# Patient Record
Sex: Female | Born: 1935
Health system: Southern US, Community
[De-identification: ages and names within clinical notes are randomized; demographics above are authoritative.]

## PROBLEM LIST (undated history)

## (undated) DIAGNOSIS — E119 Type 2 diabetes mellitus without complications: Secondary | ICD-10-CM

## (undated) DIAGNOSIS — E669 Obesity, unspecified: Secondary | ICD-10-CM

## (undated) DIAGNOSIS — K279 Peptic ulcer, site unspecified, unspecified as acute or chronic, without hemorrhage or perforation: Secondary | ICD-10-CM

## (undated) DIAGNOSIS — I10 Essential (primary) hypertension: Secondary | ICD-10-CM

## (undated) DIAGNOSIS — E7800 Pure hypercholesterolemia, unspecified: Secondary | ICD-10-CM

## (undated) DIAGNOSIS — I161 Hypertensive emergency: Secondary | ICD-10-CM

## (undated) DIAGNOSIS — E1165 Type 2 diabetes mellitus with hyperglycemia: Secondary | ICD-10-CM

## (undated) DIAGNOSIS — R079 Chest pain, unspecified: Secondary | ICD-10-CM

## (undated) DIAGNOSIS — N1832 Chronic kidney disease, stage 3b: Secondary | ICD-10-CM

## (undated) DIAGNOSIS — E785 Hyperlipidemia, unspecified: Secondary | ICD-10-CM

## (undated) DIAGNOSIS — N183 Chronic kidney disease, stage 3 (moderate): Secondary | ICD-10-CM

## (undated) DIAGNOSIS — N179 Acute kidney failure, unspecified: Secondary | ICD-10-CM

## (undated) DIAGNOSIS — K219 Gastro-esophageal reflux disease without esophagitis: Secondary | ICD-10-CM

## (undated) DIAGNOSIS — R001 Bradycardia, unspecified: Secondary | ICD-10-CM

## (undated) DIAGNOSIS — F0390 Unspecified dementia without behavioral disturbance: Secondary | ICD-10-CM

## (undated) DIAGNOSIS — I5031 Acute diastolic (congestive) heart failure: Secondary | ICD-10-CM

## (undated) DIAGNOSIS — J9600 Acute respiratory failure, unspecified whether with hypoxia or hypercapnia: Secondary | ICD-10-CM

## (undated) HISTORY — DX: Type 2 diabetes mellitus without complications: E11.9

## (undated) HISTORY — PX: APPENDECTOMY: SHX54

## (undated) HISTORY — DX: Acute respiratory failure, unspecified whether with hypoxia or hypercapnia: J96.00

## (undated) HISTORY — DX: Hyperlipidemia, unspecified: E78.5

## (undated) HISTORY — DX: Chronic kidney disease, stage 3b: N18.32

## (undated) HISTORY — DX: Pure hypercholesterolemia, unspecified: E78.00

## (undated) HISTORY — DX: Type 2 diabetes mellitus with hyperglycemia: E11.65

## (undated) HISTORY — DX: Acute kidney failure, unspecified: N17.9

## (undated) HISTORY — DX: Essential (primary) hypertension: I10

## (undated) HISTORY — DX: Acute diastolic (congestive) heart failure: I50.31

## (undated) HISTORY — DX: Peptic ulcer, site unspecified, unspecified as acute or chronic, without hemorrhage or perforation: K27.9

## (undated) HISTORY — DX: Hypertensive emergency: I16.1

## (undated) HISTORY — DX: Obesity, unspecified: E66.9

## (undated) HISTORY — DX: Gastro-esophageal reflux disease without esophagitis: K21.9

## (undated) HISTORY — DX: Unspecified dementia, unspecified severity, without behavioral disturbance, psychotic disturbance, mood disturbance, and anxiety: F03.90

## (undated) HISTORY — DX: Bradycardia, unspecified: R00.1

## (undated) HISTORY — DX: Chronic kidney disease, stage 3 (moderate): N18.3

## (undated) HISTORY — DX: Chest pain, unspecified: R07.9

---

## 2012-01-03 DIAGNOSIS — E11319 Type 2 diabetes mellitus with unspecified diabetic retinopathy without macular edema: Secondary | ICD-10-CM | POA: Diagnosis not present

## 2012-01-03 DIAGNOSIS — H2510 Age-related nuclear cataract, unspecified eye: Secondary | ICD-10-CM | POA: Diagnosis not present

## 2012-01-03 DIAGNOSIS — H35039 Hypertensive retinopathy, unspecified eye: Secondary | ICD-10-CM | POA: Diagnosis not present

## 2012-01-03 DIAGNOSIS — H04129 Dry eye syndrome of unspecified lacrimal gland: Secondary | ICD-10-CM | POA: Diagnosis not present

## 2012-01-10 DIAGNOSIS — E119 Type 2 diabetes mellitus without complications: Secondary | ICD-10-CM | POA: Diagnosis not present

## 2012-01-10 DIAGNOSIS — I10 Essential (primary) hypertension: Secondary | ICD-10-CM | POA: Diagnosis not present

## 2012-01-10 DIAGNOSIS — E785 Hyperlipidemia, unspecified: Secondary | ICD-10-CM | POA: Diagnosis not present

## 2012-02-15 DIAGNOSIS — I10 Essential (primary) hypertension: Secondary | ICD-10-CM | POA: Diagnosis not present

## 2012-02-15 DIAGNOSIS — E119 Type 2 diabetes mellitus without complications: Secondary | ICD-10-CM | POA: Diagnosis not present

## 2012-02-15 DIAGNOSIS — E785 Hyperlipidemia, unspecified: Secondary | ICD-10-CM | POA: Diagnosis not present

## 2012-02-15 DIAGNOSIS — N189 Chronic kidney disease, unspecified: Secondary | ICD-10-CM | POA: Diagnosis not present

## 2012-02-28 DIAGNOSIS — I10 Essential (primary) hypertension: Secondary | ICD-10-CM | POA: Diagnosis not present

## 2012-02-28 DIAGNOSIS — I499 Cardiac arrhythmia, unspecified: Secondary | ICD-10-CM | POA: Diagnosis not present

## 2012-03-11 DIAGNOSIS — N3000 Acute cystitis without hematuria: Secondary | ICD-10-CM | POA: Diagnosis not present

## 2012-03-11 DIAGNOSIS — N390 Urinary tract infection, site not specified: Secondary | ICD-10-CM | POA: Diagnosis not present

## 2012-03-11 DIAGNOSIS — R1084 Generalized abdominal pain: Secondary | ICD-10-CM | POA: Diagnosis not present

## 2012-03-13 DIAGNOSIS — R1904 Left lower quadrant abdominal swelling, mass and lump: Secondary | ICD-10-CM | POA: Diagnosis not present

## 2012-03-13 DIAGNOSIS — R109 Unspecified abdominal pain: Secondary | ICD-10-CM | POA: Diagnosis not present

## 2012-03-28 DIAGNOSIS — I2510 Atherosclerotic heart disease of native coronary artery without angina pectoris: Secondary | ICD-10-CM | POA: Diagnosis not present

## 2012-03-28 DIAGNOSIS — I517 Cardiomegaly: Secondary | ICD-10-CM | POA: Diagnosis not present

## 2012-03-28 DIAGNOSIS — R109 Unspecified abdominal pain: Secondary | ICD-10-CM | POA: Diagnosis not present

## 2012-05-01 DIAGNOSIS — R1012 Left upper quadrant pain: Secondary | ICD-10-CM | POA: Diagnosis not present

## 2012-05-01 DIAGNOSIS — R1011 Right upper quadrant pain: Secondary | ICD-10-CM | POA: Diagnosis not present

## 2012-05-08 DIAGNOSIS — E119 Type 2 diabetes mellitus without complications: Secondary | ICD-10-CM | POA: Diagnosis not present

## 2012-05-08 DIAGNOSIS — E785 Hyperlipidemia, unspecified: Secondary | ICD-10-CM | POA: Diagnosis not present

## 2012-05-08 DIAGNOSIS — I10 Essential (primary) hypertension: Secondary | ICD-10-CM | POA: Diagnosis not present

## 2012-06-27 DIAGNOSIS — N2581 Secondary hyperparathyroidism of renal origin: Secondary | ICD-10-CM | POA: Diagnosis not present

## 2012-06-27 DIAGNOSIS — D649 Anemia, unspecified: Secondary | ICD-10-CM | POA: Diagnosis not present

## 2012-06-27 DIAGNOSIS — I10 Essential (primary) hypertension: Secondary | ICD-10-CM | POA: Diagnosis not present

## 2012-06-27 DIAGNOSIS — I129 Hypertensive chronic kidney disease with stage 1 through stage 4 chronic kidney disease, or unspecified chronic kidney disease: Secondary | ICD-10-CM | POA: Diagnosis not present

## 2012-08-14 DIAGNOSIS — Z23 Encounter for immunization: Secondary | ICD-10-CM | POA: Diagnosis not present

## 2012-08-14 DIAGNOSIS — E119 Type 2 diabetes mellitus without complications: Secondary | ICD-10-CM | POA: Diagnosis not present

## 2012-11-11 DIAGNOSIS — D649 Anemia, unspecified: Secondary | ICD-10-CM | POA: Diagnosis not present

## 2012-11-11 DIAGNOSIS — N2581 Secondary hyperparathyroidism of renal origin: Secondary | ICD-10-CM | POA: Diagnosis not present

## 2013-01-20 DIAGNOSIS — E785 Hyperlipidemia, unspecified: Secondary | ICD-10-CM | POA: Diagnosis not present

## 2013-01-20 DIAGNOSIS — E119 Type 2 diabetes mellitus without complications: Secondary | ICD-10-CM | POA: Diagnosis not present

## 2013-02-27 DIAGNOSIS — N189 Chronic kidney disease, unspecified: Secondary | ICD-10-CM | POA: Diagnosis not present

## 2013-02-27 DIAGNOSIS — I129 Hypertensive chronic kidney disease with stage 1 through stage 4 chronic kidney disease, or unspecified chronic kidney disease: Secondary | ICD-10-CM | POA: Diagnosis not present

## 2013-02-27 DIAGNOSIS — I10 Essential (primary) hypertension: Secondary | ICD-10-CM | POA: Diagnosis not present

## 2013-02-27 DIAGNOSIS — E785 Hyperlipidemia, unspecified: Secondary | ICD-10-CM | POA: Diagnosis not present

## 2013-02-27 DIAGNOSIS — E119 Type 2 diabetes mellitus without complications: Secondary | ICD-10-CM | POA: Diagnosis not present

## 2013-03-13 DIAGNOSIS — I10 Essential (primary) hypertension: Secondary | ICD-10-CM | POA: Diagnosis not present

## 2013-04-28 DIAGNOSIS — I10 Essential (primary) hypertension: Secondary | ICD-10-CM | POA: Diagnosis not present

## 2013-04-28 DIAGNOSIS — E785 Hyperlipidemia, unspecified: Secondary | ICD-10-CM | POA: Diagnosis not present

## 2013-04-28 DIAGNOSIS — E119 Type 2 diabetes mellitus without complications: Secondary | ICD-10-CM | POA: Diagnosis not present

## 2013-05-12 DIAGNOSIS — M722 Plantar fascial fibromatosis: Secondary | ICD-10-CM | POA: Diagnosis not present

## 2013-06-10 DIAGNOSIS — M7150 Other bursitis, not elsewhere classified, unspecified site: Secondary | ICD-10-CM | POA: Diagnosis not present

## 2013-06-10 DIAGNOSIS — M722 Plantar fascial fibromatosis: Secondary | ICD-10-CM | POA: Diagnosis not present

## 2013-06-10 DIAGNOSIS — E119 Type 2 diabetes mellitus without complications: Secondary | ICD-10-CM | POA: Diagnosis not present

## 2013-07-01 DIAGNOSIS — M722 Plantar fascial fibromatosis: Secondary | ICD-10-CM | POA: Diagnosis not present

## 2013-07-01 DIAGNOSIS — M7150 Other bursitis, not elsewhere classified, unspecified site: Secondary | ICD-10-CM | POA: Diagnosis not present

## 2013-07-03 DIAGNOSIS — H524 Presbyopia: Secondary | ICD-10-CM | POA: Diagnosis not present

## 2013-07-03 DIAGNOSIS — E119 Type 2 diabetes mellitus without complications: Secondary | ICD-10-CM | POA: Diagnosis not present

## 2013-07-03 DIAGNOSIS — H2510 Age-related nuclear cataract, unspecified eye: Secondary | ICD-10-CM | POA: Diagnosis not present

## 2013-07-10 ENCOUNTER — Other Ambulatory Visit: Payer: Self-pay | Admitting: *Deleted

## 2013-07-10 MED ORDER — HYDROCHLOROTHIAZIDE 12.5 MG PO TABS: 12.5000 mg | ORAL_TABLET | Freq: Every day | ORAL | Status: DC

## 2013-07-31 ENCOUNTER — Other Ambulatory Visit: Payer: Self-pay | Admitting: *Deleted

## 2013-07-31 DIAGNOSIS — E119 Type 2 diabetes mellitus without complications: Secondary | ICD-10-CM | POA: Insufficient documentation

## 2013-07-31 HISTORY — DX: Type 2 diabetes mellitus without complications: E11.9

## 2013-08-04 ENCOUNTER — Ambulatory Visit (INDEPENDENT_AMBULATORY_CARE_PROVIDER_SITE_OTHER): Payer: Medicare Other | Admitting: Endocrinology

## 2013-08-04 ENCOUNTER — Other Ambulatory Visit (INDEPENDENT_AMBULATORY_CARE_PROVIDER_SITE_OTHER): Payer: Medicare Other

## 2013-08-04 ENCOUNTER — Encounter: Payer: Self-pay | Admitting: Endocrinology

## 2013-08-04 VITALS — BP 126/72 | HR 60 | Temp 98.2°F | Resp 12 | Ht 65.0 in | Wt 180.7 lb

## 2013-08-04 DIAGNOSIS — E119 Type 2 diabetes mellitus without complications: Secondary | ICD-10-CM | POA: Diagnosis not present

## 2013-08-04 DIAGNOSIS — E785 Hyperlipidemia, unspecified: Secondary | ICD-10-CM | POA: Diagnosis not present

## 2013-08-04 DIAGNOSIS — I10 Essential (primary) hypertension: Secondary | ICD-10-CM

## 2013-08-04 DIAGNOSIS — I129 Hypertensive chronic kidney disease with stage 1 through stage 4 chronic kidney disease, or unspecified chronic kidney disease: Secondary | ICD-10-CM | POA: Insufficient documentation

## 2013-08-04 DIAGNOSIS — R252 Cramp and spasm: Secondary | ICD-10-CM

## 2013-08-04 DIAGNOSIS — N183 Chronic kidney disease, stage 3 unspecified: Secondary | ICD-10-CM

## 2013-08-04 HISTORY — DX: Hypertensive chronic kidney disease with stage 1 through stage 4 chronic kidney disease, or unspecified chronic kidney disease: I12.9

## 2013-08-04 HISTORY — DX: Essential (primary) hypertension: I10

## 2013-08-04 LAB — MICROALBUMIN / CREATININE URINE RATIO
Creatinine,U: 225.7 mg/dL
Microalb Creat Ratio: 1.5 mg/g (ref 0.0–30.0)

## 2013-08-04 LAB — URINALYSIS, ROUTINE W REFLEX MICROSCOPIC
Bilirubin Urine: NEGATIVE
Hgb urine dipstick: NEGATIVE
Ketones, ur: NEGATIVE
Total Protein, Urine: NEGATIVE
Urine Glucose: NEGATIVE
pH: 5.5 (ref 5.0–8.0)

## 2013-08-04 LAB — COMPREHENSIVE METABOLIC PANEL
ALT: 15 U/L (ref 0–35)
BUN: 25 mg/dL — ABNORMAL HIGH (ref 6–23)
CO2: 31 mEq/L (ref 19–32)
Calcium: 9.6 mg/dL (ref 8.4–10.5)
Chloride: 105 mEq/L (ref 96–112)
Creatinine, Ser: 1.5 mg/dL — ABNORMAL HIGH (ref 0.4–1.2)
GFR: 35.19 mL/min — ABNORMAL LOW (ref >60.00)
Total Bilirubin: 0.8 mg/dL (ref 0.3–1.2)

## 2013-08-04 LAB — HEMOGLOBIN A1C: Hgb A1c MFr Bld: 7.3 % — ABNORMAL HIGH (ref 4.6–6.5)

## 2013-08-04 NOTE — Progress Notes (Signed)
Patient ID: Vanessa Craig, female   DOB: Jan 13, 1928, 79 y.o.   MRN: [id number]  Vanessa Craig is an 79 y.o. female.   Reason for Appointment: Diabetes follow-up   History of Present Illness   Diagnosis: Type 2 DIABETES MELITUS, date of diagnosis:  1995        She has had mild and usually well-controlled diabetes for several years Previously on metformin and this was stopped because of renal dysfunction Also has been tried on low dose insulin previously However with using GLP-1 drugs like Viagra and Victoza her blood sugars have been fairly well controlled  More recently has been on Victoza since 2011 and generally has good controlled with low doses and minimal nausea Oral hypoglycemic drugs: Amaryl, Actos        Side effects from medications: None  Proper timing of medications in relation to meals: Yes.         Monitors blood glucose: Once a day.    Glucometer:  Accu-Chek         Blood Glucose readings from meter download: readings before breakfast: 3-121, nonfasting 73-194; mostly higher readings after her 2 PM, no readings after 5 PM Hypoglycemia frequency: none.          Meals: 3 meals per day.          Physical activity: exercise: Walking usually 5 days a week, has resumed recently, previously had issues with plantar fasciitis   Dietician visit: Most recent: Years ago        Complications: are: Minimal   The last HbgA1c report is not available  Wt Readings from Last 3 Encounters:  08/04/13 180 lb 11.2 oz (81.965 kg)   PROBLEM #2: She is asking about leg cramps especially at night. She has tried over-the-counter remedies as well as mustard and tonic water without much consistent relief. No history of hypokalemia  Office Visit on 08/04/2013  Component Date Value Range Status  . Magnesium 08/04/2013 1.6  1.5 - 2.5 mg/dL Final  Appointment on 08/04/2013  Component Date Value Range Status  . Hemoglobin A1C 08/04/2013 7.3* 4.6 - 6.5 % Final   Glycemic Control Guidelines for  People with Diabetes:Non Diabetic:  <6%Goal of Therapy: <7%Additional Action Suggested:  >8%   . Microalb, Ur 08/04/2013 3.3* 0.0 - 1.9 mg/dL Final  . Creatinine,U 08/04/2013 225.7   Final  . Microalb Creat Ratio 08/04/2013 1.5  0.0 - 30.0 mg/g Final  . Sodium 08/04/2013 141  135 - 145 mEq/L Final  . Potassium 08/04/2013 4.3  3.5 - 5.1 mEq/L Final  . Chloride 08/04/2013 105  96 - 112 mEq/L Final  . CO2 08/04/2013 31  19 - 32 mEq/L Final  . Glucose, Bld 08/04/2013 160* 70 - 99 mg/dL Final  . BUN 08/04/2013 25* 6 - 23 mg/dL Final  . Creatinine, Ser 08/04/2013 1.5* 0.4 - 1.2 mg/dL Final  . Total Bilirubin 08/04/2013 0.8  0.3 - 1.2 mg/dL Final  . Alkaline Phosphatase 08/04/2013 35* 39 - 117 U/L Final  . AST 08/04/2013 22  0 - 37 U/L Final  . ALT 08/04/2013 15  0 - 35 U/L Final  . Total Protein 08/04/2013 6.4  6.0 - 8.3 g/dL Final  . Albumin 08/04/2013 3.6  3.5 - 5.2 g/dL Final  . Calcium 08/04/2013 9.6  8.4 - 10.5 mg/dL Final  . GFR 08/04/2013 35.19* >60.00 mL/min Final  . Color, Urine 08/04/2013 LT. YELLOW  Yellow;Lt. Yellow Final  . APPearance 08/04/2013 CLEAR  Clear Final  .  Specific Gravity, Urine 08/04/2013 1.025  1.000-1.030 Final  . pH 08/04/2013 5.5  5.0 - 8.0 Final  . Total Protein, Urine 08/04/2013 NEGATIVE  Negative Final  . Urine Glucose 08/04/2013 NEGATIVE  Negative Final  . Ketones, ur 08/04/2013 NEGATIVE  Negative Final  . Bilirubin Urine 08/04/2013 NEGATIVE  Negative Final  . Hgb urine dipstick 08/04/2013 NEGATIVE  Negative Final  . Urobilinogen, UA 08/04/2013 0.2  0.0 - 1.0 Final  . Leukocytes, UA 08/04/2013 SMALL  Negative Final  . Nitrite 08/04/2013 NEGATIVE  Negative Final  . WBC, UA 08/04/2013 7-10/hpf  0-2/hpf Final  . RBC / HPF 08/04/2013 none seen  0-2/hpf Final  . Squamous Epithelial / LPF 08/04/2013 Few(5-10/hpf)  Rare(0-4/hpf) Final  . Bacteria, UA 08/04/2013 Rare(<10/hpf)  None Final      Medication List       This list is accurate as of: 08/04/13 11:59  PM.  Always use your most recent med list.               ACCU-CHEK AVIVA PLUS test strip  Generic drug:  glucose blood     B-D ULTRAFINE III SHORT PEN 31G X 8 MM Misc  Generic drug:  Insulin Pen Needle     carvedilol 6.25 MG tablet  Commonly known as:  COREG  6.25 mg.     fenofibrate 160 MG tablet  160 mg.     glimepiride 4 MG tablet  Commonly known as:  AMARYL  2 mg. 1 1/2 at lunch and 1/2 at supper     guanFACINE 2 MG tablet  Commonly known as:  TENEX  2 mg.     hydrochlorothiazide 12.5 MG tablet  Commonly known as:  HYDRODIURIL  Take 1 tablet (12.5 mg total) by mouth daily.     LEG CRAMP RELIEF PO  Take by mouth.     NEXIUM 40 MG capsule  Generic drug:  esomeprazole  40 mg.     pioglitazone 15 MG tablet  Commonly known as:  ACTOS  15 mg.     pravastatin 80 MG tablet  Commonly known as:  PRAVACHOL  80 mg.     ramipril 10 MG capsule  Commonly known as:  ALTACE  10 mg.     ranitidine 300 MG tablet  Commonly known as:  ZANTAC  300 mg.        Allergies:  Allergies  Allergen Reactions  . Codeine Nausea And Vomiting    Past Medical History  Diagnosis Date  . Diabetes mellitus without complication     Past Surgical History  Procedure Laterality Date  . Appendectomy      Family History  Problem Relation Age of Onset  . Heart disease Father     Social History:  reports that she has never smoked. She has never used smokeless tobacco. Her alcohol and drug histories are not on file.  Review of Systems:  HYPERTENSION:  she has had long-standing hypertension treated with multiple drugs, no recent lightheadedness  Renal dysfunction: She has had abnormal renal function but relatively stable for several years, has seen nephrologist  HYPERLIPIDEMIA: The lipid abnormality consists of elevated LDL and high triglycerides, currently on fenofibrate and pravastatin.     Examination:   BP 126/72  Pulse 60  Temp(Src) 98.2 F (36.8 C)  Resp 12  Ht  5\' 5"  (1.651 m)  Wt 180 lb 11.2 oz (81.965 kg)  BMI 30.07 kg/m2  SpO2 97%  Body mass index is 30.07 kg/(m^2).  The feet are normal to inspection and no edema  ASSESSMENT/ PLAN::   Diabetes type 2   The patient's diabetes control appears to be excellent with mostly good readings and only mild increase after lunch periodically Considering her age and comorbid conditions she has good control and advised her that she does not need to be strict with her diet all the time She does exercise regularly and she will continue to do so Encouraged her to check more readings after supper She will call if she has any hypoglycemia and may need to reduce Amaryl  CKD: Stable with creatinine 1.5  Hypertension: Good controlled without orthostatic symptoms  Leg cramps: Discussed continuing OTC/home remedies as well as trying to do stretching of the calf and foot muscles for at least 5 minutes before bedtime. Will check magnesium level and supplement if needed  [facility] 08/06/2013, 3:24 PM   Addendum: Magnesium 1.6, to use 250 mg OTC twice a day

## 2013-08-04 NOTE — Patient Instructions (Addendum)
Muscle stretches at bedtime  Some sugars at 8 pm

## 2013-08-05 ENCOUNTER — Telehealth: Payer: Self-pay | Admitting: *Deleted

## 2013-08-05 ENCOUNTER — Other Ambulatory Visit: Payer: Self-pay | Admitting: *Deleted

## 2013-08-05 DIAGNOSIS — M722 Plantar fascial fibromatosis: Secondary | ICD-10-CM | POA: Diagnosis not present

## 2013-08-05 DIAGNOSIS — E119 Type 2 diabetes mellitus without complications: Secondary | ICD-10-CM | POA: Diagnosis not present

## 2013-08-05 DIAGNOSIS — M7150 Other bursitis, not elsewhere classified, unspecified site: Secondary | ICD-10-CM | POA: Diagnosis not present

## 2013-08-05 MED ORDER — RALOXIFENE HCL 60 MG PO TABS: 60.0000 mg | ORAL_TABLET | Freq: Every day | ORAL | Status: DC

## 2013-08-05 NOTE — Telephone Encounter (Signed)
Noted pt is aware 

## 2013-08-05 NOTE — Telephone Encounter (Signed)
Message copied by Roxanna Mew on Tue Aug 05, 2013  4:48 PM ------      Message from: Elayne Snare      Created: Tue Aug 05, 2013 11:36 AM       Magnesium level is low normal, start OTC magnesium 250 mg twice a day for 2 weeks and then once at night ------

## 2013-08-06 ENCOUNTER — Telehealth: Payer: Self-pay | Admitting: *Deleted

## 2013-08-06 DIAGNOSIS — N183 Chronic kidney disease, stage 3 unspecified: Secondary | ICD-10-CM

## 2013-08-06 DIAGNOSIS — E785 Hyperlipidemia, unspecified: Secondary | ICD-10-CM | POA: Insufficient documentation

## 2013-08-06 HISTORY — DX: Hyperlipidemia, unspecified: E78.5

## 2013-08-06 HISTORY — DX: Chronic kidney disease, stage 3 unspecified: N18.30

## 2013-08-06 NOTE — Telephone Encounter (Signed)
Called pt and advised her that her A1c fair at 7.3, no change in Rx and her kidney test was stable. Pt understood.

## 2013-08-12 ENCOUNTER — Other Ambulatory Visit: Payer: Self-pay | Admitting: *Deleted

## 2013-08-12 DIAGNOSIS — H2589 Other age-related cataract: Secondary | ICD-10-CM | POA: Diagnosis not present

## 2013-08-12 DIAGNOSIS — E11329 Type 2 diabetes mellitus with mild nonproliferative diabetic retinopathy without macular edema: Secondary | ICD-10-CM | POA: Diagnosis not present

## 2013-08-12 MED ORDER — PIOGLITAZONE HCL 15 MG PO TABS: 15.0000 mg | ORAL_TABLET | Freq: Every day | ORAL | Status: DC

## 2013-08-19 DIAGNOSIS — H268 Other specified cataract: Secondary | ICD-10-CM | POA: Diagnosis not present

## 2013-08-19 DIAGNOSIS — H2510 Age-related nuclear cataract, unspecified eye: Secondary | ICD-10-CM | POA: Diagnosis not present

## 2013-08-19 DIAGNOSIS — H269 Unspecified cataract: Secondary | ICD-10-CM | POA: Diagnosis not present

## 2013-08-19 DIAGNOSIS — H2589 Other age-related cataract: Secondary | ICD-10-CM | POA: Diagnosis not present

## 2013-08-22 ENCOUNTER — Telehealth: Payer: Self-pay | Admitting: Endocrinology

## 2013-08-22 NOTE — Telephone Encounter (Signed)
She was going to fax info as well, I do not see the need for me to call back. If she does fax information, you can respond accordingly. / Venida Jarvis

## 2013-08-22 NOTE — Telephone Encounter (Signed)
PATIENT DOES NOT USE THEIR SERVICE, THESE [employer] CALL AND FISH FOR AN [place].  PLEASE TELL THEM TO SEND A FAX, I WILL NOT RETURN THEIR CALLS.

## 2013-08-22 NOTE — Telephone Encounter (Signed)
Please call about testing status and verbal auth / Sherri  [phone]

## 2013-08-25 DIAGNOSIS — R110 Nausea: Secondary | ICD-10-CM | POA: Diagnosis not present

## 2013-08-29 DIAGNOSIS — H40009 Preglaucoma, unspecified, unspecified eye: Secondary | ICD-10-CM | POA: Diagnosis not present

## 2013-09-01 DIAGNOSIS — R1011 Right upper quadrant pain: Secondary | ICD-10-CM | POA: Diagnosis not present

## 2013-09-10 DIAGNOSIS — Z23 Encounter for immunization: Secondary | ICD-10-CM | POA: Diagnosis not present

## 2013-09-11 DIAGNOSIS — E119 Type 2 diabetes mellitus without complications: Secondary | ICD-10-CM | POA: Diagnosis not present

## 2013-09-11 DIAGNOSIS — M7150 Other bursitis, not elsewhere classified, unspecified site: Secondary | ICD-10-CM | POA: Diagnosis not present

## 2013-09-11 DIAGNOSIS — M722 Plantar fascial fibromatosis: Secondary | ICD-10-CM | POA: Diagnosis not present

## 2013-09-25 DIAGNOSIS — M722 Plantar fascial fibromatosis: Secondary | ICD-10-CM | POA: Diagnosis not present

## 2013-09-25 DIAGNOSIS — M7150 Other bursitis, not elsewhere classified, unspecified site: Secondary | ICD-10-CM | POA: Diagnosis not present

## 2013-09-25 DIAGNOSIS — M24573 Contracture, unspecified ankle: Secondary | ICD-10-CM | POA: Diagnosis not present

## 2013-09-29 ENCOUNTER — Other Ambulatory Visit: Payer: Self-pay | Admitting: *Deleted

## 2013-09-29 MED ORDER — GLUCOSE BLOOD VI STRP: ORAL_STRIP | Status: DC

## 2013-10-03 ENCOUNTER — Other Ambulatory Visit: Payer: Self-pay | Admitting: *Deleted

## 2013-10-03 DIAGNOSIS — IMO0001 Reserved for inherently not codable concepts without codable children: Secondary | ICD-10-CM | POA: Diagnosis not present

## 2013-10-03 DIAGNOSIS — R293 Abnormal posture: Secondary | ICD-10-CM | POA: Diagnosis not present

## 2013-10-03 DIAGNOSIS — R269 Unspecified abnormalities of gait and mobility: Secondary | ICD-10-CM | POA: Diagnosis not present

## 2013-10-03 DIAGNOSIS — M6281 Muscle weakness (generalized): Secondary | ICD-10-CM | POA: Diagnosis not present

## 2013-10-03 DIAGNOSIS — M722 Plantar fascial fibromatosis: Secondary | ICD-10-CM | POA: Diagnosis not present

## 2013-10-03 DIAGNOSIS — M25673 Stiffness of unspecified ankle, not elsewhere classified: Secondary | ICD-10-CM | POA: Diagnosis not present

## 2013-10-03 DIAGNOSIS — M79609 Pain in unspecified limb: Secondary | ICD-10-CM | POA: Diagnosis not present

## 2013-10-03 MED ORDER — FENOFIBRATE 160 MG PO TABS: 160.0000 mg | ORAL_TABLET | Freq: Every day | ORAL | Status: DC

## 2013-10-15 DIAGNOSIS — M79609 Pain in unspecified limb: Secondary | ICD-10-CM | POA: Diagnosis not present

## 2013-10-15 DIAGNOSIS — M722 Plantar fascial fibromatosis: Secondary | ICD-10-CM | POA: Diagnosis not present

## 2013-10-15 DIAGNOSIS — IMO0001 Reserved for inherently not codable concepts without codable children: Secondary | ICD-10-CM | POA: Diagnosis not present

## 2013-10-15 DIAGNOSIS — R293 Abnormal posture: Secondary | ICD-10-CM | POA: Diagnosis not present

## 2013-10-15 DIAGNOSIS — M6281 Muscle weakness (generalized): Secondary | ICD-10-CM | POA: Diagnosis not present

## 2013-10-15 DIAGNOSIS — M25673 Stiffness of unspecified ankle, not elsewhere classified: Secondary | ICD-10-CM | POA: Diagnosis not present

## 2013-10-17 DIAGNOSIS — M25673 Stiffness of unspecified ankle, not elsewhere classified: Secondary | ICD-10-CM | POA: Diagnosis not present

## 2013-10-17 DIAGNOSIS — M6281 Muscle weakness (generalized): Secondary | ICD-10-CM | POA: Diagnosis not present

## 2013-10-17 DIAGNOSIS — IMO0001 Reserved for inherently not codable concepts without codable children: Secondary | ICD-10-CM | POA: Diagnosis not present

## 2013-10-17 DIAGNOSIS — M722 Plantar fascial fibromatosis: Secondary | ICD-10-CM | POA: Diagnosis not present

## 2013-10-17 DIAGNOSIS — M79609 Pain in unspecified limb: Secondary | ICD-10-CM | POA: Diagnosis not present

## 2013-10-17 DIAGNOSIS — R293 Abnormal posture: Secondary | ICD-10-CM | POA: Diagnosis not present

## 2013-10-20 DIAGNOSIS — M722 Plantar fascial fibromatosis: Secondary | ICD-10-CM | POA: Diagnosis not present

## 2013-10-20 DIAGNOSIS — M25673 Stiffness of unspecified ankle, not elsewhere classified: Secondary | ICD-10-CM | POA: Diagnosis not present

## 2013-10-20 DIAGNOSIS — M79609 Pain in unspecified limb: Secondary | ICD-10-CM | POA: Diagnosis not present

## 2013-10-20 DIAGNOSIS — R293 Abnormal posture: Secondary | ICD-10-CM | POA: Diagnosis not present

## 2013-10-20 DIAGNOSIS — M6281 Muscle weakness (generalized): Secondary | ICD-10-CM | POA: Diagnosis not present

## 2013-10-20 DIAGNOSIS — IMO0001 Reserved for inherently not codable concepts without codable children: Secondary | ICD-10-CM | POA: Diagnosis not present

## 2013-10-21 DIAGNOSIS — M25673 Stiffness of unspecified ankle, not elsewhere classified: Secondary | ICD-10-CM | POA: Diagnosis not present

## 2013-10-21 DIAGNOSIS — IMO0001 Reserved for inherently not codable concepts without codable children: Secondary | ICD-10-CM | POA: Diagnosis not present

## 2013-10-21 DIAGNOSIS — M722 Plantar fascial fibromatosis: Secondary | ICD-10-CM | POA: Diagnosis not present

## 2013-10-21 DIAGNOSIS — R293 Abnormal posture: Secondary | ICD-10-CM | POA: Diagnosis not present

## 2013-10-21 DIAGNOSIS — M6281 Muscle weakness (generalized): Secondary | ICD-10-CM | POA: Diagnosis not present

## 2013-10-21 DIAGNOSIS — M79609 Pain in unspecified limb: Secondary | ICD-10-CM | POA: Diagnosis not present

## 2013-10-28 DIAGNOSIS — M722 Plantar fascial fibromatosis: Secondary | ICD-10-CM | POA: Diagnosis not present

## 2013-10-28 DIAGNOSIS — E119 Type 2 diabetes mellitus without complications: Secondary | ICD-10-CM | POA: Diagnosis not present

## 2013-10-28 DIAGNOSIS — M24573 Contracture, unspecified ankle: Secondary | ICD-10-CM | POA: Diagnosis not present

## 2013-10-28 DIAGNOSIS — M7150 Other bursitis, not elsewhere classified, unspecified site: Secondary | ICD-10-CM | POA: Diagnosis not present

## 2013-10-29 DIAGNOSIS — M722 Plantar fascial fibromatosis: Secondary | ICD-10-CM | POA: Diagnosis not present

## 2013-10-29 DIAGNOSIS — M79609 Pain in unspecified limb: Secondary | ICD-10-CM | POA: Diagnosis not present

## 2013-10-29 DIAGNOSIS — R293 Abnormal posture: Secondary | ICD-10-CM | POA: Diagnosis not present

## 2013-10-29 DIAGNOSIS — IMO0001 Reserved for inherently not codable concepts without codable children: Secondary | ICD-10-CM | POA: Diagnosis not present

## 2013-10-29 DIAGNOSIS — M6281 Muscle weakness (generalized): Secondary | ICD-10-CM | POA: Diagnosis not present

## 2013-10-29 DIAGNOSIS — M25673 Stiffness of unspecified ankle, not elsewhere classified: Secondary | ICD-10-CM | POA: Diagnosis not present

## 2013-10-29 DIAGNOSIS — R269 Unspecified abnormalities of gait and mobility: Secondary | ICD-10-CM | POA: Diagnosis not present

## 2013-10-31 DIAGNOSIS — M6281 Muscle weakness (generalized): Secondary | ICD-10-CM | POA: Diagnosis not present

## 2013-10-31 DIAGNOSIS — M79609 Pain in unspecified limb: Secondary | ICD-10-CM | POA: Diagnosis not present

## 2013-10-31 DIAGNOSIS — M722 Plantar fascial fibromatosis: Secondary | ICD-10-CM | POA: Diagnosis not present

## 2013-10-31 DIAGNOSIS — IMO0001 Reserved for inherently not codable concepts without codable children: Secondary | ICD-10-CM | POA: Diagnosis not present

## 2013-10-31 DIAGNOSIS — M25673 Stiffness of unspecified ankle, not elsewhere classified: Secondary | ICD-10-CM | POA: Diagnosis not present

## 2013-10-31 DIAGNOSIS — R293 Abnormal posture: Secondary | ICD-10-CM | POA: Diagnosis not present

## 2013-11-05 DIAGNOSIS — R293 Abnormal posture: Secondary | ICD-10-CM | POA: Diagnosis not present

## 2013-11-05 DIAGNOSIS — IMO0001 Reserved for inherently not codable concepts without codable children: Secondary | ICD-10-CM | POA: Diagnosis not present

## 2013-11-05 DIAGNOSIS — M6281 Muscle weakness (generalized): Secondary | ICD-10-CM | POA: Diagnosis not present

## 2013-11-05 DIAGNOSIS — M25673 Stiffness of unspecified ankle, not elsewhere classified: Secondary | ICD-10-CM | POA: Diagnosis not present

## 2013-11-05 DIAGNOSIS — M79609 Pain in unspecified limb: Secondary | ICD-10-CM | POA: Diagnosis not present

## 2013-11-05 DIAGNOSIS — M722 Plantar fascial fibromatosis: Secondary | ICD-10-CM | POA: Diagnosis not present

## 2013-11-07 DIAGNOSIS — IMO0001 Reserved for inherently not codable concepts without codable children: Secondary | ICD-10-CM | POA: Diagnosis not present

## 2013-11-07 DIAGNOSIS — M6281 Muscle weakness (generalized): Secondary | ICD-10-CM | POA: Diagnosis not present

## 2013-11-07 DIAGNOSIS — M722 Plantar fascial fibromatosis: Secondary | ICD-10-CM | POA: Diagnosis not present

## 2013-11-07 DIAGNOSIS — M25673 Stiffness of unspecified ankle, not elsewhere classified: Secondary | ICD-10-CM | POA: Diagnosis not present

## 2013-11-07 DIAGNOSIS — R293 Abnormal posture: Secondary | ICD-10-CM | POA: Diagnosis not present

## 2013-11-07 DIAGNOSIS — M79609 Pain in unspecified limb: Secondary | ICD-10-CM | POA: Diagnosis not present

## 2013-11-12 DIAGNOSIS — IMO0001 Reserved for inherently not codable concepts without codable children: Secondary | ICD-10-CM | POA: Diagnosis not present

## 2013-11-12 DIAGNOSIS — M6281 Muscle weakness (generalized): Secondary | ICD-10-CM | POA: Diagnosis not present

## 2013-11-12 DIAGNOSIS — R293 Abnormal posture: Secondary | ICD-10-CM | POA: Diagnosis not present

## 2013-11-12 DIAGNOSIS — M79609 Pain in unspecified limb: Secondary | ICD-10-CM | POA: Diagnosis not present

## 2013-11-12 DIAGNOSIS — M25673 Stiffness of unspecified ankle, not elsewhere classified: Secondary | ICD-10-CM | POA: Diagnosis not present

## 2013-11-12 DIAGNOSIS — M722 Plantar fascial fibromatosis: Secondary | ICD-10-CM | POA: Diagnosis not present

## 2013-11-13 DIAGNOSIS — D631 Anemia in chronic kidney disease: Secondary | ICD-10-CM | POA: Diagnosis not present

## 2013-11-14 DIAGNOSIS — M79609 Pain in unspecified limb: Secondary | ICD-10-CM | POA: Diagnosis not present

## 2013-11-14 DIAGNOSIS — IMO0001 Reserved for inherently not codable concepts without codable children: Secondary | ICD-10-CM | POA: Diagnosis not present

## 2013-11-14 DIAGNOSIS — M6281 Muscle weakness (generalized): Secondary | ICD-10-CM | POA: Diagnosis not present

## 2013-11-14 DIAGNOSIS — M25673 Stiffness of unspecified ankle, not elsewhere classified: Secondary | ICD-10-CM | POA: Diagnosis not present

## 2013-11-14 DIAGNOSIS — R293 Abnormal posture: Secondary | ICD-10-CM | POA: Diagnosis not present

## 2013-11-14 DIAGNOSIS — M722 Plantar fascial fibromatosis: Secondary | ICD-10-CM | POA: Diagnosis not present

## 2013-11-17 DIAGNOSIS — H40009 Preglaucoma, unspecified, unspecified eye: Secondary | ICD-10-CM | POA: Diagnosis not present

## 2013-11-17 DIAGNOSIS — H2510 Age-related nuclear cataract, unspecified eye: Secondary | ICD-10-CM | POA: Diagnosis not present

## 2013-11-18 DIAGNOSIS — M7150 Other bursitis, not elsewhere classified, unspecified site: Secondary | ICD-10-CM | POA: Diagnosis not present

## 2013-11-18 DIAGNOSIS — M24573 Contracture, unspecified ankle: Secondary | ICD-10-CM | POA: Diagnosis not present

## 2013-11-18 DIAGNOSIS — M722 Plantar fascial fibromatosis: Secondary | ICD-10-CM | POA: Diagnosis not present

## 2013-11-21 ENCOUNTER — Other Ambulatory Visit: Payer: Self-pay | Admitting: *Deleted

## 2013-11-21 DIAGNOSIS — IMO0001 Reserved for inherently not codable concepts without codable children: Secondary | ICD-10-CM | POA: Diagnosis not present

## 2013-11-21 DIAGNOSIS — R293 Abnormal posture: Secondary | ICD-10-CM | POA: Diagnosis not present

## 2013-11-21 DIAGNOSIS — M6281 Muscle weakness (generalized): Secondary | ICD-10-CM | POA: Diagnosis not present

## 2013-11-21 DIAGNOSIS — M722 Plantar fascial fibromatosis: Secondary | ICD-10-CM | POA: Diagnosis not present

## 2013-11-21 DIAGNOSIS — M25673 Stiffness of unspecified ankle, not elsewhere classified: Secondary | ICD-10-CM | POA: Diagnosis not present

## 2013-11-21 DIAGNOSIS — M79609 Pain in unspecified limb: Secondary | ICD-10-CM | POA: Diagnosis not present

## 2013-11-21 MED ORDER — GLUCOSE BLOOD VI STRP: ORAL_STRIP | Status: DC

## 2013-11-21 MED ORDER — ONETOUCH DELICA LANCETS FINE MISC: Status: DC

## 2013-11-26 DIAGNOSIS — M25673 Stiffness of unspecified ankle, not elsewhere classified: Secondary | ICD-10-CM | POA: Diagnosis not present

## 2013-11-26 DIAGNOSIS — M722 Plantar fascial fibromatosis: Secondary | ICD-10-CM | POA: Diagnosis not present

## 2013-11-26 DIAGNOSIS — M6281 Muscle weakness (generalized): Secondary | ICD-10-CM | POA: Diagnosis not present

## 2013-11-26 DIAGNOSIS — IMO0001 Reserved for inherently not codable concepts without codable children: Secondary | ICD-10-CM | POA: Diagnosis not present

## 2013-11-26 DIAGNOSIS — R293 Abnormal posture: Secondary | ICD-10-CM | POA: Diagnosis not present

## 2013-11-26 DIAGNOSIS — M79609 Pain in unspecified limb: Secondary | ICD-10-CM | POA: Diagnosis not present

## 2013-11-28 DIAGNOSIS — M25676 Stiffness of unspecified foot, not elsewhere classified: Secondary | ICD-10-CM | POA: Diagnosis not present

## 2013-11-28 DIAGNOSIS — R269 Unspecified abnormalities of gait and mobility: Secondary | ICD-10-CM | POA: Diagnosis not present

## 2013-11-28 DIAGNOSIS — M79609 Pain in unspecified limb: Secondary | ICD-10-CM | POA: Diagnosis not present

## 2013-11-28 DIAGNOSIS — M6281 Muscle weakness (generalized): Secondary | ICD-10-CM | POA: Diagnosis not present

## 2013-11-28 DIAGNOSIS — R293 Abnormal posture: Secondary | ICD-10-CM | POA: Diagnosis not present

## 2013-11-28 DIAGNOSIS — M25673 Stiffness of unspecified ankle, not elsewhere classified: Secondary | ICD-10-CM | POA: Diagnosis not present

## 2013-11-28 DIAGNOSIS — IMO0001 Reserved for inherently not codable concepts without codable children: Secondary | ICD-10-CM | POA: Diagnosis not present

## 2013-11-28 DIAGNOSIS — M722 Plantar fascial fibromatosis: Secondary | ICD-10-CM | POA: Diagnosis not present

## 2013-12-03 DIAGNOSIS — M25673 Stiffness of unspecified ankle, not elsewhere classified: Secondary | ICD-10-CM | POA: Diagnosis not present

## 2013-12-03 DIAGNOSIS — M722 Plantar fascial fibromatosis: Secondary | ICD-10-CM | POA: Diagnosis not present

## 2013-12-03 DIAGNOSIS — IMO0001 Reserved for inherently not codable concepts without codable children: Secondary | ICD-10-CM | POA: Diagnosis not present

## 2013-12-03 DIAGNOSIS — M25676 Stiffness of unspecified foot, not elsewhere classified: Secondary | ICD-10-CM | POA: Diagnosis not present

## 2013-12-03 DIAGNOSIS — R293 Abnormal posture: Secondary | ICD-10-CM | POA: Diagnosis not present

## 2013-12-03 DIAGNOSIS — M6281 Muscle weakness (generalized): Secondary | ICD-10-CM | POA: Diagnosis not present

## 2013-12-03 DIAGNOSIS — M79609 Pain in unspecified limb: Secondary | ICD-10-CM | POA: Diagnosis not present

## 2013-12-06 DIAGNOSIS — J209 Acute bronchitis, unspecified: Secondary | ICD-10-CM | POA: Diagnosis not present

## 2013-12-06 DIAGNOSIS — J019 Acute sinusitis, unspecified: Secondary | ICD-10-CM | POA: Diagnosis not present

## 2013-12-08 ENCOUNTER — Ambulatory Visit: Payer: Medicare Other | Admitting: Endocrinology

## 2013-12-18 DIAGNOSIS — IMO0001 Reserved for inherently not codable concepts without codable children: Secondary | ICD-10-CM | POA: Diagnosis not present

## 2013-12-18 DIAGNOSIS — J111 Influenza due to unidentified influenza virus with other respiratory manifestations: Secondary | ICD-10-CM | POA: Diagnosis not present

## 2013-12-18 DIAGNOSIS — E785 Hyperlipidemia, unspecified: Secondary | ICD-10-CM | POA: Diagnosis not present

## 2013-12-26 ENCOUNTER — Other Ambulatory Visit: Payer: Self-pay | Admitting: *Deleted

## 2013-12-26 MED ORDER — GLUCOSE BLOOD VI STRP: ORAL_STRIP | Status: DC

## 2013-12-30 DIAGNOSIS — M24576 Contracture, unspecified foot: Secondary | ICD-10-CM | POA: Diagnosis not present

## 2013-12-30 DIAGNOSIS — E119 Type 2 diabetes mellitus without complications: Secondary | ICD-10-CM | POA: Diagnosis not present

## 2013-12-30 DIAGNOSIS — M7150 Other bursitis, not elsewhere classified, unspecified site: Secondary | ICD-10-CM | POA: Diagnosis not present

## 2013-12-30 DIAGNOSIS — M722 Plantar fascial fibromatosis: Secondary | ICD-10-CM | POA: Diagnosis not present

## 2013-12-30 DIAGNOSIS — M24573 Contracture, unspecified ankle: Secondary | ICD-10-CM | POA: Diagnosis not present

## 2014-01-07 ENCOUNTER — Other Ambulatory Visit: Payer: Self-pay | Admitting: Endocrinology

## 2014-01-07 ENCOUNTER — Encounter: Payer: Self-pay | Admitting: Endocrinology

## 2014-01-07 ENCOUNTER — Ambulatory Visit (INDEPENDENT_AMBULATORY_CARE_PROVIDER_SITE_OTHER): Payer: Medicare Other | Admitting: Endocrinology

## 2014-01-07 VITALS — BP 130/72 | HR 57 | Temp 98.1°F | Resp 14 | Ht 65.0 in | Wt 173.8 lb

## 2014-01-07 DIAGNOSIS — IMO0001 Reserved for inherently not codable concepts without codable children: Secondary | ICD-10-CM

## 2014-01-07 DIAGNOSIS — N183 Chronic kidney disease, stage 3 unspecified: Secondary | ICD-10-CM | POA: Diagnosis not present

## 2014-01-07 DIAGNOSIS — I10 Essential (primary) hypertension: Secondary | ICD-10-CM | POA: Diagnosis not present

## 2014-01-07 DIAGNOSIS — E119 Type 2 diabetes mellitus without complications: Secondary | ICD-10-CM | POA: Diagnosis not present

## 2014-01-07 DIAGNOSIS — E1165 Type 2 diabetes mellitus with hyperglycemia: Principal | ICD-10-CM

## 2014-01-07 LAB — HEMOGLOBIN A1C
Hgb A1c MFr Bld: 7 % — ABNORMAL HIGH (ref <5.7)
MEAN PLASMA GLUCOSE: 154 mg/dL — AB (ref <117)

## 2014-01-07 NOTE — Progress Notes (Signed)
Patient ID: Vanessa Craig, female   DOB: 1928/10/12, 80 y.o.   MRN: [id number]   Reason for Appointment: Diabetes follow-up   History of Present Illness   Diagnosis: Type 2 DIABETES MELITUS, date of diagnosis:  1995        She has had mild and usually well-controlled diabetes for several years Previously on metformin and this was stopped because of renal dysfunction Also has been tried on low dose basal insulin previously However with using GLP-1 drugs like Byetta and Victoza her blood sugars have been fairly well controlled She has been on Victoza since 2011   Recent history: She is still taking the equivalent of 0.9 mg Victoza which does not cause nausea compared to 1.2 mg She has however lost weight from intercurrent illness recently Blood sugars are sporadically high but her overall blood sugar is still averaging fairly good at 142 Oral hypoglycemic drugs: Amaryl, Actos        Side effects from medications: None  Monitors blood glucose: Once a day.    Glucometer:  Accu-Chek         Blood Glucose readings from meter download:  Fasting range 105-142; nonfasting recently 120-206 with sporadic high readings at midday or after supper  Hypoglycemia: She was getting hypoglycemic about 2 weeks ago when she was having a viral infection with decreased intake and did not reduce her Amaryl            Meals: 3 meals per day. Cereal in am; occ cheese          Physical activity: exercise: Walking less because of intercurrent illnesses Dietician visit: Most recent: Years ago        Complications: are: Minimal    Wt Readings from Last 3 Encounters:  01/07/14 173 lb 12.8 oz (78.835 kg)  08/04/13 180 lb 11.2 oz (81.965 kg)    Lab Results  Component Value Date   HGBA1C 7.3* 08/04/2013   Lab Results  Component Value Date   MICROALBUR 3.3* 08/04/2013   CREATININE 1.5* 08/04/2013    PROBLEM #2: She is asking about leg cramps especially at night. She has tried over-the-counter remedies as well  as mustard and tonic water without much consistent relief. No history of hypokalemia  No visits with results within 1 Week(s) from this visit. Latest known visit with results is:  Office Visit on 08/04/2013  Component Date Value Ref Range Status  . Magnesium 08/04/2013 1.6  1.5 - 2.5 mg/dL Final      Medication List       This list is accurate as of: 01/07/14 11:59 PM.  Always use your most recent med list.               aspirin 81 MG tablet  Take 81 mg by mouth daily.     B-D ULTRAFINE III SHORT PEN 31G X 8 MM Misc  Generic drug:  Insulin Pen Needle     carvedilol 6.25 MG tablet  Commonly known as:  COREG  6.25 mg.     fenofibrate 160 MG tablet  Take 1 tablet (160 mg total) by mouth daily.     Fish Oil 1000 MG Caps  Take by mouth.     glimepiride 4 MG tablet  Commonly known as:  AMARYL  2 mg. 1 1/2 at lunch and 1/2 at supper     glucose blood test strip  Commonly known as:  ONE TOUCH ULTRA TEST  Use as instructed to check blood sugars once  a day dx code 250.00     guanFACINE 2 MG tablet  Commonly known as:  TENEX  2 mg.     hydrochlorothiazide 12.5 MG tablet  Commonly known as:  HYDRODIURIL  Take 1 tablet (12.5 mg total) by mouth daily.     LEG CRAMP RELIEF PO  Take by mouth.     magnesium oxide 400 MG tablet  Commonly known as:  MAG-OX  Take 400 mg by mouth daily.     multivitamin with minerals Tabs tablet  Take 1 tablet by mouth daily.     NEXIUM 40 MG capsule  Generic drug:  esomeprazole  40 mg.     ONETOUCH DELICA LANCETS FINE Misc  Use to check blood sugars once a day dx code 250.00     pioglitazone 15 MG tablet  Commonly known as:  ACTOS  Take 1 tablet (15 mg total) by mouth daily.     pravastatin 80 MG tablet  Commonly known as:  PRAVACHOL  80 mg.     raloxifene 60 MG tablet  Commonly known as:  EVISTA  Take 1 tablet (60 mg total) by mouth daily.     ramipril 10 MG capsule  Commonly known as:  ALTACE  10 mg.     ranitidine  300 MG tablet  Commonly known as:  ZANTAC  300 mg.     VICTOZA 18 MG/3ML Sopn  Generic drug:  Liraglutide  Inject 0.6 mg into the skin. Injects mg plus 5 clicks     vitamin 0000000 1000 MCG tablet  Commonly known as:  CYANOCOBALAMIN  Take 1,000 mcg by mouth daily.     Vitamin D3 3000 UNITS Tabs  Take by mouth.        Allergies:  Allergies  Allergen Reactions  . Codeine Nausea And Vomiting    Past Medical History  Diagnosis Date  . Diabetes mellitus without complication     Past Surgical History  Procedure Laterality Date  . Appendectomy      Family History  Problem Relation Age of Onset  . Heart disease Father     Social History:  reports that she has never smoked. She has never used smokeless tobacco. Her alcohol and drug histories are not on file.  Review of Systems:  HYPERTENSION:  she has had long-standing hypertension treated with multiple drugs in good control  Renal dysfunction: She has had abnormal renal function but relatively stable for several years, has seen nephrologist  HYPERLIPIDEMIA: The lipid abnormality consists of elevated LDL and high triglycerides, currently on fenofibrate and pravastatin.  No results found for this basename: CHOL,  HDL,  LDLCALC,  LDLDIRECT,  TRIG,  CHOLHDL       Examination:   BP 130/72  Pulse 57  Temp(Src) 98.1 F (36.7 C)  Resp 14  Ht 5\' 5"  (1.651 m)  Wt 173 lb 12.8 oz (78.835 kg)  BMI 28.92 kg/m2  SpO2 96%  Body mass index is 28.92 kg/(m^2).   ASSESSMENT/ PLAN::   Diabetes type 2   The patient's diabetes control appears to be  overall fairly good although has had some fluctuation Also is getting some hypoglycemia when she had decreased illness related to viral infection Discussed needing to reduce Amaryl if she has decreased appetite Overall her control is good for her age and comorbidities She will continue her regimen of low-dose Victoza, Amaryl and Actos  CKD:  mild; creatinine to be followed    Hypertension: Good controlled without orthostatic symptoms  Vanessa Craig 01/10/2014, 2:20 PM

## 2014-01-07 NOTE — Patient Instructions (Signed)
Please check blood sugars at least half the time about 2 hours after any meal and as directed on waking up. Please bring blood sugar monitor to each visit

## 2014-01-08 LAB — COMPREHENSIVE METABOLIC PANEL
ALK PHOS: 38 U/L — AB (ref 39–117)
ALT: 10 U/L (ref 0–35)
AST: 17 U/L (ref 0–37)
Albumin: 3.8 g/dL (ref 3.5–5.2)
BILIRUBIN TOTAL: 0.6 mg/dL (ref 0.2–1.2)
BUN: 27 mg/dL — AB (ref 6–23)
CALCIUM: 9.9 mg/dL (ref 8.4–10.5)
CHLORIDE: 104 meq/L (ref 96–112)
CO2: 30 mEq/L (ref 19–32)
CREATININE: 1.59 mg/dL — AB (ref 0.50–1.10)
Glucose, Bld: 51 mg/dL — ABNORMAL LOW (ref 70–99)
Potassium: 4.6 mEq/L (ref 3.5–5.3)
Sodium: 140 mEq/L (ref 135–145)
Total Protein: 6.2 g/dL (ref 6.0–8.3)

## 2014-01-08 LAB — LIPID PANEL
Cholesterol: 128 mg/dL (ref 0–200)
HDL: 46 mg/dL (ref >39)
LDL CALC: 57 mg/dL (ref 0–99)
Total CHOL/HDL Ratio: 2.8 Ratio
Triglycerides: 127 mg/dL (ref <150)
VLDL: 25 mg/dL (ref 0–40)

## 2014-01-27 ENCOUNTER — Other Ambulatory Visit: Payer: Self-pay | Admitting: Endocrinology

## 2014-02-03 DIAGNOSIS — Z961 Presence of intraocular lens: Secondary | ICD-10-CM | POA: Diagnosis not present

## 2014-02-03 DIAGNOSIS — H2510 Age-related nuclear cataract, unspecified eye: Secondary | ICD-10-CM | POA: Diagnosis not present

## 2014-02-03 DIAGNOSIS — H18419 Arcus senilis, unspecified eye: Secondary | ICD-10-CM | POA: Diagnosis not present

## 2014-02-03 DIAGNOSIS — E119 Type 2 diabetes mellitus without complications: Secondary | ICD-10-CM | POA: Diagnosis not present

## 2014-02-09 DIAGNOSIS — H2510 Age-related nuclear cataract, unspecified eye: Secondary | ICD-10-CM | POA: Diagnosis not present

## 2014-02-09 DIAGNOSIS — H269 Unspecified cataract: Secondary | ICD-10-CM | POA: Diagnosis not present

## 2014-02-11 ENCOUNTER — Other Ambulatory Visit: Payer: Self-pay | Admitting: *Deleted

## 2014-02-11 MED ORDER — RALOXIFENE HCL 60 MG PO TABS: 60.0000 mg | ORAL_TABLET | Freq: Every day | ORAL | Status: DC

## 2014-03-02 DIAGNOSIS — J209 Acute bronchitis, unspecified: Secondary | ICD-10-CM | POA: Diagnosis not present

## 2014-03-09 DIAGNOSIS — J209 Acute bronchitis, unspecified: Secondary | ICD-10-CM | POA: Diagnosis not present

## 2014-03-19 ENCOUNTER — Other Ambulatory Visit: Payer: Self-pay | Admitting: *Deleted

## 2014-03-19 MED ORDER — GLIMEPIRIDE 4 MG PO TABS: ORAL_TABLET | ORAL | Status: DC

## 2014-03-30 DIAGNOSIS — Z1231 Encounter for screening mammogram for malignant neoplasm of breast: Secondary | ICD-10-CM | POA: Diagnosis not present

## 2014-03-30 DIAGNOSIS — I10 Essential (primary) hypertension: Secondary | ICD-10-CM | POA: Diagnosis not present

## 2014-03-30 DIAGNOSIS — E119 Type 2 diabetes mellitus without complications: Secondary | ICD-10-CM | POA: Diagnosis not present

## 2014-03-30 DIAGNOSIS — E785 Hyperlipidemia, unspecified: Secondary | ICD-10-CM | POA: Diagnosis not present

## 2014-03-30 DIAGNOSIS — K219 Gastro-esophageal reflux disease without esophagitis: Secondary | ICD-10-CM | POA: Diagnosis not present

## 2014-04-06 ENCOUNTER — Ambulatory Visit (INDEPENDENT_AMBULATORY_CARE_PROVIDER_SITE_OTHER): Payer: Medicare Other | Admitting: Endocrinology

## 2014-04-06 ENCOUNTER — Encounter: Payer: Self-pay | Admitting: Endocrinology

## 2014-04-06 ENCOUNTER — Other Ambulatory Visit: Payer: Self-pay | Admitting: *Deleted

## 2014-04-06 ENCOUNTER — Other Ambulatory Visit (INDEPENDENT_AMBULATORY_CARE_PROVIDER_SITE_OTHER): Payer: Medicare Other

## 2014-04-06 VITALS — BP 146/84 | HR 63 | Temp 98.3°F | Resp 14 | Ht 65.0 in | Wt 176.6 lb

## 2014-04-06 DIAGNOSIS — E119 Type 2 diabetes mellitus without complications: Secondary | ICD-10-CM

## 2014-04-06 DIAGNOSIS — I10 Essential (primary) hypertension: Secondary | ICD-10-CM | POA: Diagnosis not present

## 2014-04-06 DIAGNOSIS — N183 Chronic kidney disease, stage 3 unspecified: Secondary | ICD-10-CM

## 2014-04-06 LAB — COMPREHENSIVE METABOLIC PANEL
ALT: 12 U/L (ref 0–35)
AST: 21 U/L (ref 0–37)
Albumin: 3.5 g/dL (ref 3.5–5.2)
Alkaline Phosphatase: 35 U/L — ABNORMAL LOW (ref 39–117)
BUN: 19 mg/dL (ref 6–23)
CO2: 29 meq/L (ref 19–32)
Calcium: 9.5 mg/dL (ref 8.4–10.5)
Chloride: 104 mEq/L (ref 96–112)
Creatinine, Ser: 1.5 mg/dL — ABNORMAL HIGH (ref 0.4–1.2)
GFR: 36.54 mL/min — AB (ref >60.00)
GLUCOSE: 189 mg/dL — AB (ref 70–99)
Potassium: 4.6 mEq/L (ref 3.5–5.1)
Sodium: 139 mEq/L (ref 135–145)
TOTAL PROTEIN: 6.2 g/dL (ref 6.0–8.3)
Total Bilirubin: 0.6 mg/dL (ref 0.2–1.2)

## 2014-04-06 LAB — URINALYSIS, ROUTINE W REFLEX MICROSCOPIC
BILIRUBIN URINE: NEGATIVE
HGB URINE DIPSTICK: NEGATIVE
Ketones, ur: NEGATIVE
Leukocytes, UA: NEGATIVE
Nitrite: NEGATIVE
PH: 6 (ref 5.0–8.0)
RBC / HPF: NONE SEEN (ref 0–?)
Specific Gravity, Urine: 1.025 (ref 1.000–1.030)
Total Protein, Urine: NEGATIVE
URINE GLUCOSE: NEGATIVE
Urobilinogen, UA: 0.2 (ref 0.0–1.0)

## 2014-04-06 LAB — MICROALBUMIN / CREATININE URINE RATIO
CREATININE, U: 167 mg/dL
MICROALB UR: 2 mg/dL — AB (ref 0.0–1.9)
MICROALB/CREAT RATIO: 1.2 mg/g (ref 0.0–30.0)

## 2014-04-06 LAB — LIPID PANEL
CHOLESTEROL: 124 mg/dL (ref 0–200)
HDL: 45.7 mg/dL (ref >39.00)
LDL Cholesterol: 61 mg/dL (ref 0–99)
Total CHOL/HDL Ratio: 3
Triglycerides: 86 mg/dL (ref 0.0–149.0)
VLDL: 17.2 mg/dL (ref 0.0–40.0)

## 2014-04-06 LAB — HEMOGLOBIN A1C: Hgb A1c MFr Bld: 6.9 % — ABNORMAL HIGH (ref 4.6–6.5)

## 2014-04-06 NOTE — Patient Instructions (Signed)
Stop Glimeperide at supper  More sugars after supper

## 2014-04-06 NOTE — Progress Notes (Signed)
Patient ID: Vanessa Craig, female   DOB: 07/24/1928, 80 y.o.   MRN: [id number]   Reason for Appointment: Diabetes follow-up   History of Present Illness   Diagnosis: Type 2 DIABETES MELITUS, date of diagnosis: 1995        She has had mild and usually well-controlled diabetes for several years Previously on metformin and this was stopped because of renal dysfunction Also has been tried on low dose basal insulin previously However with using GLP-1 drugs like Byetta and Victoza her blood sugars have been fairly well controlled She has been on Victoza since 2011   Recent history: Her blood sugars are overall is really good and have been consistent He tends to have relatively higher readings after lunch even though she thinks she is usually watching her diet Again has not checked any readings after supper However blood sugars in the mornings are low normal with occasional low readings also She is still taking the equivalent of 0.9 mg Victoza which does not cause nausea compared to 1.2 mg  Blood sugars are sporadically high but her overall blood sugar is still averaging fairly good at 142 Oral hypoglycemic drugs: Amaryl 8 mg, Actos 15 mg        Side effects from medications: None  Monitors blood glucose: Once a day.    Glucometer: One Touch       Blood Glucose readings from meter download:   PREMEAL Breakfast Lunch  PCL   ACS  Overall  Glucose range:  54-132   99-175   175-206   69-204   54-206   Median:  100    184   129   128     Hypoglycemia:  Sporadically on waking up, also had more symptomatic hypoglycemia last month on waking up          Meals: 3 meals per day. Cereal in am for breakfast, occasionally with cheese         Physical activity: exercise: Walking 45 min 5 days a week in the mornings Dietician visit: Most recent: Years ago        Complications: are: Minimal    Wt Readings from Last 3 Encounters:  04/06/14 176 lb 9.6 oz (80.105 kg)  01/07/14 173 lb 12.8 oz (78.835  kg)  08/04/13 180 lb 11.2 oz (81.965 kg)    Lab Results  Component Value Date   HGBA1C 7.0* 01/07/2014   HGBA1C 7.3* 08/04/2013   Lab Results  Component Value Date   MICROALBUR 3.3* 08/04/2013   LDLCALC 57 01/07/2014   CREATININE 1.59* 01/07/2014    PROBLEM #2: She is asking about leg cramps especially at night. She has tried over-the-counter remedies as well as mustard and tonic water without much consistent relief. No history of hypokalemia  No visits with results within 1 Week(s) from this visit. Latest known visit with results is:  Orders Only on 01/07/2014  Component Date Value Ref Range Status  . Cholesterol 01/07/2014 128  0 - 200 mg/dL Final   Comment: ATP III Classification:                                < 200        mg/dL        Desirable  200 - 239     mg/dL        Borderline High                               >= 240        mg/dL        High                             . Triglycerides 01/07/2014 127  <150 mg/dL Final  . HDL 01/07/2014 46  >39 mg/dL Final  . Total CHOL/HDL Ratio 01/07/2014 2.8   Final  . VLDL 01/07/2014 25  0 - 40 mg/dL Final  . LDL Cholesterol 01/07/2014 57  0 - 99 mg/dL Final   Comment:                            Total Cholesterol/HDL Ratio:CHD Risk                                                 Coronary Heart Disease Risk Table                                                                 Men       Women                                   1/2 Average Risk              3.4        3.3                                       Average Risk              5.0        4.4                                    2X Average Risk              9.6        7.1                                    3X Average Risk             23.4       11.0                          Use the calculated Patient Ratio above and the CHD Risk table  to determine the patient's CHD Risk.                          ATP III Classification (LDL):                                 < 100        mg/dL         Optimal                               100 - 129     mg/dL         Near or Above Optimal                               130 - 159     mg/dL         Borderline High                               160 - 189     mg/dL         High                                > 190        mg/dL         Very High                                 Medication List       This list is accurate as of: 04/06/14 10:05 AM.  Always use your most recent med list.               aspirin 81 MG tablet  Take 81 mg by mouth daily.     B-D ULTRAFINE III SHORT PEN 31G X 8 MM Misc  Generic drug:  Insulin Pen Needle     carvedilol 6.25 MG tablet  Commonly known as:  COREG  6.25 mg.     fenofibrate 160 MG tablet  Take 1 tablet (160 mg total) by mouth daily.     Fish Oil 1000 MG Caps  Take by mouth.     glimepiride 4 MG tablet  Commonly known as:  AMARYL  Take 1/2 tablet at breakfast and 1 1/2 tablets at lunch daily     glucose blood test strip  Commonly known as:  ONE TOUCH ULTRA TEST  Use as instructed to check blood sugars once a day dx code 250.00     guanFACINE 2 MG tablet  Commonly known as:  TENEX  2 mg.     hydrochlorothiazide 12.5 MG tablet  Commonly known as:  HYDRODIURIL  Take 1 tablet (12.5 mg total) by mouth daily.     LEG CRAMP RELIEF PO  Take by mouth.     magnesium oxide 400 MG tablet  Commonly known as:  MAG-OX  Take 400 mg by mouth daily.     multivitamin with minerals Tabs tablet  Take 1 tablet by mouth daily.     NEXIUM 40 MG capsule  Generic drug:  esomeprazole  40 mg.  ONETOUCH DELICA LANCETS FINE Misc  Use to check blood sugars once a day dx code 250.00     pioglitazone 15 MG tablet  Commonly known as:  ACTOS  TAKE 1 TABLET (15 MG TOTAL) BY MOUTH DAILY.     pravastatin 80 MG tablet  Commonly known as:  PRAVACHOL  80 mg.     raloxifene 60 MG tablet  Commonly known as:  EVISTA  Take 1 tablet (60 mg total) by  mouth daily.     ramipril 10 MG capsule  Commonly known as:  ALTACE  10 mg.     ranitidine 300 MG tablet  Commonly known as:  ZANTAC  300 mg.     VICTOZA 18 MG/3ML Sopn  Generic drug:  Liraglutide  Inject 0.6 mg into the skin. Injects mg plus 5 clicks     vitamin 0000000 1000 MCG tablet  Commonly known as:  CYANOCOBALAMIN  Take 1,000 mcg by mouth daily.     Vitamin D3 3000 UNITS Tabs  Take by mouth.        Allergies:  Allergies  Allergen Reactions  . Codeine Nausea And Vomiting    Past Medical History  Diagnosis Date  . Diabetes mellitus without complication     Past Surgical History  Procedure Laterality Date  . Appendectomy      Family History  Problem Relation Age of Onset  . Heart disease Father     Social History:  reports that she has never smoked. She has never used smokeless tobacco. Her alcohol and drug histories are not on file.  Review of Systems:  HYPERTENSION:  she has had long-standing hypertension treated with multiple drugs, recently saw primary care physician. However her treatment is being done by her cardiologist and has appointment next week   Renal dysfunction: She has had abnormal renal function but relatively stable for several years, has seen nephrologist annually  HYPERLIPIDEMIA: The lipid abnormality consists of elevated LDL and high triglycerides, currently on fenofibrate and pravastatin.  Lab Results  Component Value Date   CHOL 128 01/07/2014   HDL 46 01/07/2014   LDLCALC 57 01/07/2014   TRIG 127 01/07/2014   CHOLHDL 2.8 01/07/2014     Examination:   BP 148/84  Pulse 63  Temp(Src) 98.3 F (36.8 C)  Resp 14  Ht 5\' 5"  (1.651 m)  Wt 176 lb 9.6 oz (80.105 kg)  BMI 29.39 kg/m2  SpO2 97%  Body mass index is 29.39 kg/(m^2).   ASSESSMENT/ PLAN::   Diabetes type 2   The patient's diabetes control appears to be  overall fairly good although she tends to have high readings after lunch despite taking 6 mg of Amaryl before  eating Also is getting some hypoglycemia on waking up Overall her control is good for her age and comorbidities She will continue her regimen of low-dose Victoza, Amaryl and Actos but will stop her Amaryl at suppertime to avoid overnight hypoglycemia  CKD:  Last creatinine was 1.59, to be checked again today  Hypertension: blood pressure is high normal and she will discuss this with cardiologist next week   Elayne Snare 04/06/2014, 10:05 AM

## 2014-04-07 NOTE — Progress Notes (Signed)
Quick Note:  Diabetes control/A1c is good. Kidney test unchanged, mildly abnormal ______

## 2014-04-16 DIAGNOSIS — N189 Chronic kidney disease, unspecified: Secondary | ICD-10-CM | POA: Diagnosis not present

## 2014-04-16 DIAGNOSIS — E785 Hyperlipidemia, unspecified: Secondary | ICD-10-CM | POA: Diagnosis not present

## 2014-04-16 DIAGNOSIS — I129 Hypertensive chronic kidney disease with stage 1 through stage 4 chronic kidney disease, or unspecified chronic kidney disease: Secondary | ICD-10-CM | POA: Diagnosis not present

## 2014-04-16 DIAGNOSIS — E119 Type 2 diabetes mellitus without complications: Secondary | ICD-10-CM | POA: Diagnosis not present

## 2014-04-30 ENCOUNTER — Other Ambulatory Visit: Payer: Self-pay | Admitting: *Deleted

## 2014-04-30 MED ORDER — GLIMEPIRIDE 4 MG PO TABS: 4.0000 mg | ORAL_TABLET | Freq: Every day | ORAL | Status: DC

## 2014-05-07 ENCOUNTER — Other Ambulatory Visit: Payer: Self-pay | Admitting: *Deleted

## 2014-05-07 ENCOUNTER — Telehealth: Payer: Self-pay | Admitting: Endocrinology

## 2014-05-07 MED ORDER — GLIMEPIRIDE 4 MG PO TABS: 4.0000 mg | ORAL_TABLET | Freq: Every day | ORAL | Status: DC

## 2014-05-07 NOTE — Telephone Encounter (Signed)
glimipride has to be 90 day supply please change to 90 day supply

## 2014-05-07 NOTE — Telephone Encounter (Signed)
rx sent for a 90 day supply

## 2014-05-08 ENCOUNTER — Other Ambulatory Visit: Payer: Self-pay | Admitting: *Deleted

## 2014-05-08 MED ORDER — GLIMEPIRIDE 4 MG PO TABS: 4.0000 mg | ORAL_TABLET | Freq: Every day | ORAL | Status: DC

## 2014-05-26 ENCOUNTER — Other Ambulatory Visit: Payer: Self-pay | Admitting: *Deleted

## 2014-05-26 ENCOUNTER — Telehealth: Payer: Self-pay | Admitting: Endocrinology

## 2014-05-26 MED ORDER — INSULIN PEN NEEDLE 31G X 8 MM MISC: Status: DC

## 2014-05-26 NOTE — Telephone Encounter (Signed)
Resend rx for pen needles as 90 day per insurance please

## 2014-05-26 NOTE — Telephone Encounter (Signed)
done

## 2014-06-02 DIAGNOSIS — Z1231 Encounter for screening mammogram for malignant neoplasm of breast: Secondary | ICD-10-CM | POA: Diagnosis not present

## 2014-06-22 ENCOUNTER — Other Ambulatory Visit: Payer: Self-pay | Admitting: *Deleted

## 2014-06-22 MED ORDER — LIRAGLUTIDE 18 MG/3ML ~~LOC~~ SOPN: 0.6000 mg | PEN_INJECTOR | Freq: Every day | SUBCUTANEOUS | Status: DC

## 2014-07-07 ENCOUNTER — Ambulatory Visit (INDEPENDENT_AMBULATORY_CARE_PROVIDER_SITE_OTHER): Payer: Medicare Other | Admitting: Endocrinology

## 2014-07-07 ENCOUNTER — Encounter: Payer: Self-pay | Admitting: Endocrinology

## 2014-07-07 ENCOUNTER — Other Ambulatory Visit (INDEPENDENT_AMBULATORY_CARE_PROVIDER_SITE_OTHER): Payer: Medicare Other

## 2014-07-07 VITALS — BP 132/72 | HR 62 | Temp 98.4°F | Resp 16 | Ht 65.0 in | Wt 174.6 lb

## 2014-07-07 DIAGNOSIS — N183 Chronic kidney disease, stage 3 unspecified: Secondary | ICD-10-CM | POA: Diagnosis not present

## 2014-07-07 DIAGNOSIS — E1165 Type 2 diabetes mellitus with hyperglycemia: Principal | ICD-10-CM

## 2014-07-07 DIAGNOSIS — E119 Type 2 diabetes mellitus without complications: Secondary | ICD-10-CM | POA: Diagnosis not present

## 2014-07-07 DIAGNOSIS — IMO0001 Reserved for inherently not codable concepts without codable children: Secondary | ICD-10-CM | POA: Diagnosis not present

## 2014-07-07 LAB — BASIC METABOLIC PANEL
BUN: 23 mg/dL (ref 6–23)
CHLORIDE: 103 meq/L (ref 96–112)
CO2: 25 meq/L (ref 19–32)
Calcium: 9.4 mg/dL (ref 8.4–10.5)
Creatinine, Ser: 1.6 mg/dL — ABNORMAL HIGH (ref 0.4–1.2)
GFR: 32.59 mL/min — AB (ref >60.00)
GLUCOSE: 161 mg/dL — AB (ref 70–99)
POTASSIUM: 4.3 meq/L (ref 3.5–5.1)
Sodium: 138 mEq/L (ref 135–145)

## 2014-07-07 LAB — HEMOGLOBIN A1C: Hgb A1c MFr Bld: 7.2 % — ABNORMAL HIGH (ref 4.6–6.5)

## 2014-07-07 NOTE — Patient Instructions (Signed)
Please check blood sugars at least half the time about 2 hours after any meal and 2-3 times per week on waking up.  Please bring blood sugar monitor to each visit

## 2014-07-07 NOTE — Progress Notes (Signed)
Quick Note:  Please let patient know that the A1c and kidney tests are about the same and adequate ______

## 2014-07-07 NOTE — Progress Notes (Signed)
Patient ID: Vanessa Craig, female   DOB: [birth year], 80 y.o.   MRN: [id number]   Reason for Appointment: Diabetes follow-up   History of Present Illness   Diagnosis: Type 2 DIABETES MELITUS, date of diagnosis: 1995        She has had mild and usually well-controlled diabetes for several years Previously on metformin and this was stopped because of renal dysfunction Also has been tried on low dose basal insulin previously However with using GLP-1 drugs like Byetta and Victoza her blood sugars have been fairly well controlled She has been on Victoza since 2011   Recent history: Her blood sugars are overall is fairly good and has been consistent with A1c generally around 7% She tends to have relatively higher readings after meals and some of these are from sweets Her blood sugars are quite consistent in the morning, previously were occasionally low She was told to stop her suppertime Amaryl because of morning low sugars but did not do so She is still taking the equivalent of 0.9 mg Victoza which does not cause nausea compared to 1.2 mg  Blood sugars are sporadically high but her overall blood sugar is still averaging fairly good at 142 Oral hypoglycemic drugs: Amaryl 6--2 mg, Actos 15 mg        Side effects from medications: None  Monitors blood glucose: Once a day.    Glucometer: One Touch       Blood Glucose readings from meter download:   PREMEAL Breakfast Lunch Dinner Bedtime Overall  Glucose range: 82-135  75-173   125-226     Mean/median:  117    157   133   POST-MEAL PC Breakfast PC Lunch PC Dinner  Glucose range:   106-223   158-203   Mean/median:   146   176     Hypoglycemia:  None recently          Meals: 3 meals per day. Cereal in am for breakfast, occasionally with cheese         Physical activity: exercise: Walking 45 min 5 days a week in the mornings Dietician visit: Most recent: Years ago        Complications: are: Minimal Eye exam 3/15   Wt Readings from Last  3 Encounters:  07/07/14 174 lb 9.6 oz (79.198 kg)  04/06/14 176 lb 9.6 oz (80.105 kg)  01/07/14 173 lb 12.8 oz (78.835 kg)    Lab Results  Component Value Date   HGBA1C 6.9* 04/06/2014   HGBA1C 7.0* 01/07/2014   HGBA1C 7.3* 08/04/2013   Lab Results  Component Value Date   MICROALBUR 2.0* 04/06/2014   LDLCALC 61 04/06/2014   CREATININE 1.5* 04/06/2014      No visits with results within 1 Week(s) from this visit. Latest known visit with results is:  Appointment on 04/06/2014  Component Date Value Ref Range Status  . Sodium 04/06/2014 139  135 - 145 mEq/L Final  . Potassium 04/06/2014 4.6  3.5 - 5.1 mEq/L Final  . Chloride 04/06/2014 104  96 - 112 mEq/L Final  . CO2 04/06/2014 29  19 - 32 mEq/L Final  . Glucose, Bld 04/06/2014 189* 70 - 99 mg/dL Final  . BUN 04/06/2014 19  6 - 23 mg/dL Final  . Creatinine, Ser 04/06/2014 1.5* 0.4 - 1.2 mg/dL Final  . Total Bilirubin 04/06/2014 0.6  0.2 - 1.2 mg/dL Final  . Alkaline Phosphatase 04/06/2014 35* 39 - 117 U/L Final  . AST 04/06/2014 21  0 -  37 U/L Final  . ALT 04/06/2014 12  0 - 35 U/L Final  . Total Protein 04/06/2014 6.2  6.0 - 8.3 g/dL Final  . Albumin 04/06/2014 3.5  3.5 - 5.2 g/dL Final  . Calcium 04/06/2014 9.5  8.4 - 10.5 mg/dL Final  . GFR 04/06/2014 36.54* >60.00 mL/min Final  . Cholesterol 04/06/2014 124  0 - 200 mg/dL Final   ATP III Classification       Desirable:  < 200 mg/dL               Borderline High:  200 - 239 mg/dL          High:  > = 240 mg/dL  . Triglycerides 04/06/2014 86.0  0.0 - 149.0 mg/dL Final   Normal:  <150 mg/dLBorderline High:  150 - 199 mg/dL  . HDL 04/06/2014 45.70  >39.00 mg/dL Final  . VLDL 04/06/2014 17.2  0.0 - 40.0 mg/dL Final  . LDL Cholesterol 04/06/2014 61  0 - 99 mg/dL Final  . Total CHOL/HDL Ratio 04/06/2014 3   Final                  Men          Women1/2 Average Risk     3.4          3.3Average Risk          5.0          4.42X Average Risk          9.6          7.13X Average Risk           15.0          11.0                      . Hemoglobin A1C 04/06/2014 6.9* 4.6 - 6.5 % Final   Glycemic Control Guidelines for People with Diabetes:Non Diabetic:  <6%Goal of Therapy: <7%Additional Action Suggested:  >8%   . Color, Urine 04/06/2014 YELLOW  Yellow;Lt. Yellow Final  . APPearance 04/06/2014 CLEAR  Clear Final  . Specific Gravity, Urine 04/06/2014 1.025  1.000-1.030 Final  . pH 04/06/2014 6.0  5.0 - 8.0 Final  . Total Protein, Urine 04/06/2014 NEGATIVE  Negative Final  . Urine Glucose 04/06/2014 NEGATIVE  Negative Final  . Ketones, ur 04/06/2014 NEGATIVE  Negative Final  . Bilirubin Urine 04/06/2014 NEGATIVE  Negative Final  . Hgb urine dipstick 04/06/2014 NEGATIVE  Negative Final  . Urobilinogen, UA 04/06/2014 0.2  0.0 - 1.0 Final  . Leukocytes, UA 04/06/2014 NEGATIVE  Negative Final  . Nitrite 04/06/2014 NEGATIVE  Negative Final  . WBC, UA 04/06/2014 0-2/hpf  0-2/hpf Final  . RBC / HPF 04/06/2014 none seen  0-2/hpf Final  . Squamous Epithelial / LPF 04/06/2014 Rare(0-4/hpf)  Rare(0-4/hpf) Final  . Microalb, Ur 04/06/2014 2.0* 0.0 - 1.9 mg/dL Final  . Creatinine,U 04/06/2014 167.0   Final  . Microalb Creat Ratio 04/06/2014 1.2  0.0 - 30.0 mg/g Final      Medication List       This list is accurate as of: 07/07/14 10:33 AM.  Always use your most recent med list.               aspirin 81 MG tablet  Take 81 mg by mouth daily.     carvedilol 6.25 MG tablet  Commonly known as:  COREG  6.25 mg.     fenofibrate 160  MG tablet  Take 1 tablet (160 mg total) by mouth daily.     Fish Oil 1000 MG Caps  Take by mouth.     glimepiride 4 MG tablet  Commonly known as:  AMARYL  Take 4 mg by mouth daily with breakfast. Take  1 1/2 tablets at lunch daily     glimepiride 4 MG tablet  Commonly known as:  AMARYL  Take 1 tablet (4 mg total) by mouth daily with breakfast. Take  1 1/2 tablets at lunch daily     glucose blood test strip  Commonly known as:  ONE TOUCH ULTRA TEST   Use as instructed to check blood sugars once a day dx code 250.00     guanFACINE 2 MG tablet  Commonly known as:  TENEX  2 mg.     hydrochlorothiazide 12.5 MG tablet  Commonly known as:  HYDRODIURIL  Take 1 tablet (12.5 mg total) by mouth daily.     Insulin Pen Needle 31G X 8 MM Misc  Commonly known as:  B-D ULTRAFINE III SHORT PEN  Use as directed two times per day     LEG CRAMP RELIEF PO  Take by mouth.     Liraglutide 18 MG/3ML Sopn  Commonly known as:  VICTOZA  Inject 0.6 mg into the skin daily. Injects mg plus 5 clicks     magnesium oxide 400 MG tablet  Commonly known as:  MAG-OX  Take 400 mg by mouth daily.     multivitamin with minerals Tabs tablet  Take 1 tablet by mouth daily.     NEXIUM 40 MG capsule  Generic drug:  esomeprazole  40 mg.     ONETOUCH DELICA LANCETS FINE Misc  Use to check blood sugars once a day dx code 250.00     pioglitazone 15 MG tablet  Commonly known as:  ACTOS  TAKE 1 TABLET (15 MG TOTAL) BY MOUTH DAILY.     pravastatin 80 MG tablet  Commonly known as:  PRAVACHOL  80 mg.     raloxifene 60 MG tablet  Commonly known as:  EVISTA  Take 1 tablet (60 mg total) by mouth daily.     ramipril 10 MG capsule  Commonly known as:  ALTACE  10 mg.     ranitidine 300 MG tablet  Commonly known as:  ZANTAC  300 mg.     vitamin B-12 1000 MCG tablet  Commonly known as:  CYANOCOBALAMIN  Take 1,000 mcg by mouth daily.     Vitamin D3 3000 UNITS Tabs  Take by mouth.        Allergies:  Allergies  Allergen Reactions  . Codeine Nausea And Vomiting    Past Medical History  Diagnosis Date  . Diabetes mellitus without complication     Past Surgical History  Procedure Laterality Date  . Appendectomy      Family History  Problem Relation Age of Onset  . Heart disease Father     Social History:  reports that she has never smoked. She has never used smokeless tobacco. Her alcohol and drug histories are not on file.  Review of  Systems:  HYPERTENSION:  she has had long-standing hypertension treated with multiple drugs. Followed by cardiologist  Renal dysfunction: She has had abnormal renal function but relatively stable for several years, has seen nephrologist annually  Lab Results  Component Value Date   CREATININE 1.5* 04/06/2014    HYPERLIPIDEMIA: The lipid abnormality consists of elevated LDL and high triglycerides,  currently on fenofibrate and pravastatin with good control.  Lab Results  Component Value Date   CHOL 124 04/06/2014   HDL 45.70 04/06/2014   LDLCALC 61 04/06/2014   TRIG 86.0 04/06/2014   CHOLHDL 3 04/06/2014     Examination:   BP 132/72  Pulse 62  Temp(Src) 98.4 F (36.9 C)  Resp 16  Ht 5\' 5"  (1.651 m)  Wt 174 lb 9.6 oz (79.198 kg)  BMI 29.05 kg/m2  SpO2 97%  Body mass index is 29.05 kg/(m^2).   ASSESSMENT/ PLAN::   Diabetes type 2   The patient's diabetes control appears to be  overall fairly good although she tends to have high readings after meals despite taking a total of 8 mg Amaryl along with her Victoza Some of her high readings are related to eating sweets Since her last visit she is not getting any low blood sugars in the mornings which are excellent Overall her control is good for her age, duration of diabetes and comorbidities If her A1c is about the same will not need to change her regimen but may consider using Prandin instead of Amaryl She will continue her regimen of low-dose Victoza and Actos for now   Hypertension: Controlled   Kimber Fritts 07/07/2014, 10:33 AM

## 2014-07-16 DIAGNOSIS — S93609A Unspecified sprain of unspecified foot, initial encounter: Secondary | ICD-10-CM | POA: Diagnosis not present

## 2014-07-20 ENCOUNTER — Other Ambulatory Visit: Payer: Self-pay | Admitting: Endocrinology

## 2014-07-23 DIAGNOSIS — M84376A Stress fracture, unspecified foot, initial encounter for fracture: Secondary | ICD-10-CM | POA: Diagnosis not present

## 2014-07-31 ENCOUNTER — Other Ambulatory Visit: Payer: Self-pay | Admitting: *Deleted

## 2014-07-31 MED ORDER — FENOFIBRATE 160 MG PO TABS
160.0000 mg | ORAL_TABLET | Freq: Every day | ORAL | Status: DC
Start: 2014-07-31 — End: 2015-01-24

## 2014-07-31 MED ORDER — RALOXIFENE HCL 60 MG PO TABS: 60.0000 mg | ORAL_TABLET | Freq: Every day | ORAL | Status: DC

## 2014-08-06 DIAGNOSIS — M84376A Stress fracture, unspecified foot, initial encounter for fracture: Secondary | ICD-10-CM | POA: Diagnosis not present

## 2014-08-20 DIAGNOSIS — E119 Type 2 diabetes mellitus without complications: Secondary | ICD-10-CM | POA: Diagnosis not present

## 2014-08-20 DIAGNOSIS — M84376A Stress fracture, unspecified foot, initial encounter for fracture: Secondary | ICD-10-CM | POA: Diagnosis not present

## 2014-09-01 DIAGNOSIS — Z23 Encounter for immunization: Secondary | ICD-10-CM | POA: Diagnosis not present

## 2014-09-17 DIAGNOSIS — Z961 Presence of intraocular lens: Secondary | ICD-10-CM | POA: Diagnosis not present

## 2014-09-17 DIAGNOSIS — E11339 Type 2 diabetes mellitus with moderate nonproliferative diabetic retinopathy without macular edema: Secondary | ICD-10-CM | POA: Diagnosis not present

## 2014-09-21 ENCOUNTER — Other Ambulatory Visit: Payer: Self-pay | Admitting: *Deleted

## 2014-09-21 MED ORDER — LIRAGLUTIDE 18 MG/3ML ~~LOC~~ SOPN: 1.2000 mg | PEN_INJECTOR | Freq: Every day | SUBCUTANEOUS | Status: DC

## 2014-10-07 ENCOUNTER — Encounter: Payer: Self-pay | Admitting: Endocrinology

## 2014-10-07 ENCOUNTER — Ambulatory Visit (INDEPENDENT_AMBULATORY_CARE_PROVIDER_SITE_OTHER): Payer: Medicare Other | Admitting: Endocrinology

## 2014-10-07 VITALS — BP 130/70 | HR 60 | Temp 98.1°F | Resp 14 | Ht 65.0 in | Wt 176.6 lb

## 2014-10-07 DIAGNOSIS — N183 Chronic kidney disease, stage 3 unspecified: Secondary | ICD-10-CM

## 2014-10-07 DIAGNOSIS — IMO0002 Reserved for concepts with insufficient information to code with codable children: Secondary | ICD-10-CM

## 2014-10-07 DIAGNOSIS — E1165 Type 2 diabetes mellitus with hyperglycemia: Secondary | ICD-10-CM | POA: Diagnosis not present

## 2014-10-07 LAB — COMPREHENSIVE METABOLIC PANEL
ALBUMIN: 3.3 g/dL — AB (ref 3.5–5.2)
ALK PHOS: 40 U/L (ref 39–117)
ALT: 11 U/L (ref 0–35)
AST: 23 U/L (ref 0–37)
BILIRUBIN TOTAL: 0.6 mg/dL (ref 0.2–1.2)
BUN: 27 mg/dL — ABNORMAL HIGH (ref 6–23)
CO2: 24 mEq/L (ref 19–32)
Calcium: 9.4 mg/dL (ref 8.4–10.5)
Chloride: 106 mEq/L (ref 96–112)
Creatinine, Ser: 1.7 mg/dL — ABNORMAL HIGH (ref 0.4–1.2)
GFR: 30.79 mL/min — ABNORMAL LOW (ref >60.00)
Glucose, Bld: 170 mg/dL — ABNORMAL HIGH (ref 70–99)
POTASSIUM: 4.2 meq/L (ref 3.5–5.1)
SODIUM: 140 meq/L (ref 135–145)
TOTAL PROTEIN: 6.5 g/dL (ref 6.0–8.3)

## 2014-10-07 LAB — HEMOGLOBIN A1C: HEMOGLOBIN A1C: 7 % — AB (ref 4.6–6.5)

## 2014-10-07 MED ORDER — REPAGLINIDE 2 MG PO TABS: 2.0000 mg | ORAL_TABLET | Freq: Three times a day (TID) | ORAL | Status: DC

## 2014-10-07 NOTE — Progress Notes (Signed)
Patient ID: Vanessa Craig, female   DOB: [birth year], 80 y.o.   MRN: [id number]   Reason for Appointment: Diabetes follow-up   History of Present Illness   Diagnosis: Type 2 DIABETES MELITUS, date of diagnosis: 1995        She has had mild and usually well-controlled diabetes for several years Previously on metformin and this was stopped because of renal dysfunction Also has been tried on low dose basal insulin previously However with using GLP-1 drugs like Byetta and Victoza her blood sugars have been fairly well controlled She has been on Victoza since 2011   Recent history: Her blood sugars are overall looking fairly good  Previously her A1c has been consistent  around 7% She tends to have relatively higher readings after meals and these are not very consistent although not checking as often after dinner This is despite taking maximum dose Amaryl, is taking the higher dose at lunchtime Her blood sugars are quite consistent in the morning,  She is very compliant with her medications She is still taking the equivalent of 0.9 mg Victoza which does not cause nausea compared to 1.2 mg She has had some interruption of her exercise regimen but his back doing this now  Oral hypoglycemic drugs: Amaryl 6--2 mg, Actos 15 mgin a.m.        Side effects from medications: None  Monitors blood glucose: Once a day.    Glucometer: One Touch       Blood Glucose readings from meter download:   PREMEAL Breakfast Lunch Dinner Bedtime Overall  Glucose range: 83-96 95, 122 81-103    median:     167   POST-MEAL PC Breakfast PC Lunch PC Dinner  Glucose range: 263 118-228 130-234  Mean/median:       Hypoglycemia:  None recently          Meals: 3 meals per day. [employer] Cereal in am for breakfast,variable carbohydrate intake at other meals        Physical activity: exercise: Walking 45 min 5 days a week in the mornings recently Dietician visit: Most recent: Years ago        Complications: are:  Minimal Eye exam 3/15   Wt Readings from Last 3 Encounters:  10/07/14 176 lb 9.6 oz (80.105 kg)  07/07/14 174 lb 9.6 oz (79.198 kg)  04/06/14 176 lb 9.6 oz (80.105 kg)    Lab Results  Component Value Date   HGBA1C 7.2* 07/07/2014   HGBA1C 6.9* 04/06/2014   HGBA1C 7.0* 01/07/2014   Lab Results  Component Value Date   MICROALBUR 2.0* 04/06/2014   LDLCALC 61 04/06/2014   CREATININE 1.6* 07/07/2014      No visits with results within 1 Week(s) from this visit. Latest known visit with results is:  Appointment on 07/07/2014  Component Date Value Ref Range Status  . Hgb A1c MFr Bld 07/07/2014 7.2* 4.6 - 6.5 % Final   Glycemic Control Guidelines for People with Diabetes:Non Diabetic:  <6%Goal of Therapy: <7%Additional Action Suggested:  >8%   . Sodium 07/07/2014 138  135 - 145 mEq/L Final  . Potassium 07/07/2014 4.3  3.5 - 5.1 mEq/L Final  . Chloride 07/07/2014 103  96 - 112 mEq/L Final  . CO2 07/07/2014 25  19 - 32 mEq/L Final  . Glucose, Bld 07/07/2014 161* 70 - 99 mg/dL Final  . BUN 07/07/2014 23  6 - 23 mg/dL Final  . Creatinine, Ser 07/07/2014 1.6* 0.4 - 1.2 mg/dL Final  .  Calcium 07/07/2014 9.4  8.4 - 10.5 mg/dL Final  . GFR 07/07/2014 32.59* >60.00 mL/min Final      Medication List       This list is accurate as of: 10/07/14 10:10 AM.  Always use your most recent med list.               aspirin 81 MG tablet  Take 81 mg by mouth daily.     carvedilol 6.25 MG tablet  Commonly known as:  COREG  6.25 mg.     fenofibrate 160 MG tablet  Take 1 tablet (160 mg total) by mouth daily.     Fish Oil 1000 MG Caps  Take by mouth.     glimepiride 4 MG tablet  Commonly known as:  AMARYL  Take 4 mg by mouth daily with breakfast. Take  1 1/2 tablets at lunch and 1/2 at supperdaily     glucose blood test strip  Commonly known as:  ONE TOUCH ULTRA TEST  Use as instructed to check blood sugars once a day dx code 250.00     guanFACINE 2 MG tablet  Commonly known as:   TENEX  2 mg.     hydrochlorothiazide 12.5 MG capsule  Commonly known as:  MICROZIDE  TAKE ONE CAPSULE BY MOUTH DAILY     Insulin Pen Needle 31G X 8 MM Misc  Commonly known as:  B-D ULTRAFINE III SHORT PEN  Use as directed two times per day     LEG CRAMP RELIEF PO  Take by mouth.     Liraglutide 18 MG/3ML Sopn  Commonly known as:  VICTOZA  Inject 1.2 mg into the skin daily. Injects mg plus 5 clicks     magnesium oxide 400 MG tablet  Commonly known as:  MAG-OX  Take 400 mg by mouth daily.     multivitamin with minerals Tabs tablet  Take 1 tablet by mouth daily.     NEXIUM 40 MG capsule  Generic drug:  esomeprazole  40 mg.     ONETOUCH DELICA LANCETS FINE Misc  Use to check blood sugars once a day dx code 250.00     pioglitazone 15 MG tablet  Commonly known as:  ACTOS  TAKE 1 TABLET (15 MG TOTAL) BY MOUTH DAILY.     pravastatin 80 MG tablet  Commonly known as:  PRAVACHOL  80 mg.     raloxifene 60 MG tablet  Commonly known as:  EVISTA  Take 1 tablet (60 mg total) by mouth daily.     ramipril 10 MG capsule  Commonly known as:  ALTACE  10 mg.     ranitidine 300 MG tablet  Commonly known as:  ZANTAC  300 mg.     vitamin B-12 1000 MCG tablet  Commonly known as:  CYANOCOBALAMIN  Take 1,000 mcg by mouth daily.     Vitamin D3 3000 UNITS Tabs  Take by mouth.        Allergies:  Allergies  Allergen Reactions  . Codeine Nausea And Vomiting    Past Medical History  Diagnosis Date  . Diabetes mellitus without complication     Past Surgical History  Procedure Laterality Date  . Appendectomy      Family History  Problem Relation Age of Onset  . Heart disease Father     Social History:  reports that she has never smoked. She has never used smokeless tobacco. Her alcohol and drug histories are not on file.  Review of Systems:  She apparently had a stress fracture of her foot without any injury.  She was not evaluated further Not clear if she has had  a bone density recently She does take vitamin D3 consistently as a supplement  HYPERTENSION:  she has had long-standing hypertension treated with multiple drugs. Followed by cardiologist  Renal dysfunction: She has had abnormal renal function but relatively stable for several years, has seen nephrologist annually Creatinine was relatively higher on the last visit  No records are available from nephrologist, not clear if she has had an abnormal PTH level  Lab Results  Component Value Date   CREATININE 1.6* 07/07/2014    HYPERLIPIDEMIA: The lipid abnormality consists of elevated LDL and high triglycerides, currently on fenofibrate and pravastatin with good control.  Lab Results  Component Value Date   CHOL 124 04/06/2014   HDL 45.70 04/06/2014   LDLCALC 61 04/06/2014   TRIG 86.0 04/06/2014   CHOLHDL 3 04/06/2014     Examination:   BP 130/70 mmHg  Pulse 60  Temp(Src) 98.1 F (36.7 C)  Resp 14  Ht 5\' 5"  (1.651 m)  Wt 176 lb 9.6 oz (80.105 kg)  BMI 29.39 kg/m2  SpO2 98%  Body mass index is 29.39 kg/(m^2).   No ankle edema  ASSESSMENT/ PLAN:   Diabetes type 2   The patient's diabetes control appears to be fair considering her age She has been compliant with her exercise although was not able to do this consistently this summer  Again she tends to have high readings after meals despite taking a total of 8 mg Amaryl along with her Victoza She has been consistently eating cereal in the morning which appears to raise her sugars well over 200 Discussed the following with the patient:  Change breakfast to something with protein and gave her several examples  Trial of Prandin 2 mg before meals 3 times a day instead of Amaryl.  She can adjust this dose further based on her postprandial readings and also does not a fire as if she has a hypoglycemia.  If her sugars are not better with this she can go back to Amaryl but watch her carbohydrates better  Continue 0.9 mgVictoza and  Actos for now   ?  OSTEOPOROSIS with stress fracture foot.  She will discuss bone density with her PCP Meanwhile will need to check her PTH level because of her renal dysfunction  RENAL dysfunction: Apparently related to multiple factors including glomerulosclerosis Her last creatinine was 1.6 and will check again today She follows with nephrologist annually and have advised her to discuss all her medications with him on the next visit Will adjust her fenofibrate back to 5 days a week since it is cleared by the kidney  Hypertension: Controlled with current regimen including ramipril 10 mg  Counseling time over 50% of today's 25 minute visit  Vanessa Craig 10/07/2014, 10:10 AM   Addendum: A1c 7%, creatinine slightly higher at 1.7, forwarded to nephrologist  Office Visit on 10/07/2014  Component Date Value Ref Range Status  . Hgb A1c MFr Bld 10/07/2014 7.0* 4.6 - 6.5 % Final   Glycemic Control Guidelines for People with Diabetes:Non Diabetic:  <6%Goal of Therapy: <7%Additional Action Suggested:  >8%   . Sodium 10/07/2014 140  135 - 145 mEq/L Final  . Potassium 10/07/2014 4.2  3.5 - 5.1 mEq/L Final  . Chloride 10/07/2014 106  96 - 112 mEq/L Final  . CO2 10/07/2014 24  19 - 32 mEq/L Final  . Glucose,  Bld 10/07/2014 170* 70 - 99 mg/dL Final  . BUN 10/07/2014 27* 6 - 23 mg/dL Final  . Creatinine, Ser 10/07/2014 1.7* 0.4 - 1.2 mg/dL Final  . Total Bilirubin 10/07/2014 0.6  0.2 - 1.2 mg/dL Final  . Alkaline Phosphatase 10/07/2014 40  39 - 117 U/L Final  . AST 10/07/2014 23  0 - 37 U/L Final  . ALT 10/07/2014 11  0 - 35 U/L Final  . Total Protein 10/07/2014 6.5  6.0 - 8.3 g/dL Final  . Albumin 10/07/2014 3.3* 3.5 - 5.2 g/dL Final  . Calcium 10/07/2014 9.4  8.4 - 10.5 mg/dL Final  . GFR 10/07/2014 30.79* >60.00 mL/min Final

## 2014-10-07 NOTE — Patient Instructions (Signed)
Stop Amaryl and start Prandin 2mg  just before each meal   Protein at Bfst; and no cereal  Check bone density  Fenofibrate take it 5 days a day

## 2014-10-08 LAB — PARATHYROID HORMONE, INTACT (NO CA): PTH: 21 pg/mL (ref 15–65)

## 2014-10-15 ENCOUNTER — Emergency Department (HOSPITAL_COMMUNITY)
Admission: EM | Admit: 2014-10-15 | Discharge: 2014-10-15 | Disposition: A | Discharge status: Home / Self Care | Payer: Medicare Other | Attending: Emergency Medicine | Admitting: Emergency Medicine

## 2014-10-15 ENCOUNTER — Encounter (HOSPITAL_COMMUNITY): Payer: Self-pay | Admitting: Emergency Medicine

## 2014-10-15 ENCOUNTER — Emergency Department (HOSPITAL_COMMUNITY): Payer: Medicare Other

## 2014-10-15 DIAGNOSIS — S0003XA Contusion of scalp, initial encounter: Secondary | ICD-10-CM | POA: Insufficient documentation

## 2014-10-15 DIAGNOSIS — E119 Type 2 diabetes mellitus without complications: Secondary | ICD-10-CM | POA: Insufficient documentation

## 2014-10-15 DIAGNOSIS — Y9289 Other specified places as the place of occurrence of the external cause: Secondary | ICD-10-CM | POA: Insufficient documentation

## 2014-10-15 DIAGNOSIS — Z79899 Other long term (current) drug therapy: Secondary | ICD-10-CM | POA: Diagnosis not present

## 2014-10-15 DIAGNOSIS — M542 Cervicalgia: Secondary | ICD-10-CM | POA: Diagnosis not present

## 2014-10-15 DIAGNOSIS — R51 Headache: Secondary | ICD-10-CM | POA: Diagnosis not present

## 2014-10-15 DIAGNOSIS — I10 Essential (primary) hypertension: Secondary | ICD-10-CM | POA: Insufficient documentation

## 2014-10-15 DIAGNOSIS — S0093XA Contusion of unspecified part of head, initial encounter: Secondary | ICD-10-CM

## 2014-10-15 DIAGNOSIS — Y9389 Activity, other specified: Secondary | ICD-10-CM | POA: Insufficient documentation

## 2014-10-15 DIAGNOSIS — W108XXA Fall (on) (from) other stairs and steps, initial encounter: Secondary | ICD-10-CM | POA: Insufficient documentation

## 2014-10-15 DIAGNOSIS — Y998 Other external cause status: Secondary | ICD-10-CM | POA: Insufficient documentation

## 2014-10-15 DIAGNOSIS — S064X0A Epidural hemorrhage without loss of consciousness, initial encounter: Secondary | ICD-10-CM | POA: Diagnosis not present

## 2014-10-15 DIAGNOSIS — Z7982 Long term (current) use of aspirin: Secondary | ICD-10-CM | POA: Diagnosis not present

## 2014-10-15 DIAGNOSIS — S0083XA Contusion of other part of head, initial encounter: Secondary | ICD-10-CM | POA: Diagnosis not present

## 2014-10-15 DIAGNOSIS — W19XXXA Unspecified fall, initial encounter: Secondary | ICD-10-CM

## 2014-10-15 DIAGNOSIS — S199XXA Unspecified injury of neck, initial encounter: Secondary | ICD-10-CM | POA: Diagnosis not present

## 2014-10-15 DIAGNOSIS — S0990XA Unspecified injury of head, initial encounter: Secondary | ICD-10-CM | POA: Diagnosis not present

## 2014-10-15 HISTORY — DX: Essential (primary) hypertension: I10

## 2014-10-15 MED ORDER — ACETAMINOPHEN 325 MG PO TABS
650.0000 mg | ORAL_TABLET | Freq: Once | ORAL | Status: AC
  Administered 2014-10-15: 650 mg via ORAL
  Filled 2014-10-15: qty 2

## 2014-10-15 NOTE — ED Notes (Signed)
Ice pack applied.

## 2014-10-15 NOTE — ED Provider Notes (Signed)
CSN: [id number]     Arrival date & time 10/15/14  2035 History   First MD Initiated Contact with Patient 10/15/14 2048     Chief Complaint  Patient presents with  . Fall     (Consider location/radiation/quality/duration/timing/severity/associated sxs/prior Treatment) HPI   Patient reports she was at a women's meeting and when she was leaving she came out the back door and was going down some metal stairs when she slipped where the steps were wet and fell backwards hitting the back of her head on the metal stairs. She did not have loss of consciousness and her daughter who was ahead of her verifies she was not unconscious. She initially had some nausea when she first stood but she does not have nausea now. She denies having any pain at all. She denies any pain in her extremities, back, numbness or tingling in her extremities.She reports her blood pressure was very high when EMS came to check her at 220/70. Patient is not on blood thinners but does take aspirin 81 mg once a day.  PCP Dr Laqueta Due  Past Medical History  Diagnosis Date  . Diabetes mellitus without complication   . Hypertension    Past Surgical History  Procedure Laterality Date  . Appendectomy     Family History  Problem Relation Age of Onset  . Heart disease Father    History  Substance Use Topics  . Smoking status: Never Smoker   . Smokeless tobacco: Never Used  . Alcohol Use: No   Lives at home Lives alone  OB History    No data available     Review of Systems  All other systems reviewed and are negative.     Allergies  Codeine  Home Medications   Prior to Admission medications   Medication Sig Start Date End Date Taking? Authorizing Provider  aspirin 81 MG tablet Take 81 mg by mouth at bedtime.    Yes Historical Provider, MD  carvedilol (COREG) 6.25 MG tablet Take 6.25 mg by mouth 2 (two) times daily with a meal.  07/09/13  Yes Historical Provider, MD  Cholecalciferol (VITAMIN D3) 3000 UNITS  TABS Take 1 capsule by mouth at bedtime.    Yes Historical Provider, MD  fenofibrate 160 MG tablet Take 1 tablet (160 mg total) by mouth daily. 07/31/14  Yes Elayne Snare, MD  guanFACINE (TENEX) 2 MG tablet Take 2 mg by mouth at bedtime.  06/01/13  Yes Historical Provider, MD  hydrochlorothiazide (MICROZIDE) 12.5 MG capsule TAKE ONE CAPSULE BY MOUTH DAILY 07/20/14  Yes Elayne Snare, MD  Liraglutide (VICTOZA) 18 MG/3ML SOPN Inject 1.2 mg into the skin daily. Injects mg plus 5 clicks [id number]  Yes Elayne Snare, MD  magnesium oxide (MAG-OX) 400 MG tablet Take 400 mg by mouth daily.   Yes Historical Provider, MD  Multiple Vitamin (MULTIVITAMIN WITH MINERALS) TABS tablet Take 1 tablet by mouth daily.   Yes Historical Provider, MD  NEXIUM 40 MG capsule Take 40 mg by mouth daily.  05/13/13  Yes Historical Provider, MD  Omega-3 Fatty Acids (FISH OIL) 1000 MG CAPS Take 1,000 mg by mouth 2 (two) times daily.    Yes Historical Provider, MD  pioglitazone (ACTOS) 15 MG tablet TAKE 1 TABLET (15 MG TOTAL) BY MOUTH DAILY. 07/20/14  Yes Elayne Snare, MD  pravastatin (PRAVACHOL) 80 MG tablet Take 80 mg by mouth daily.  06/17/13  Yes Historical Provider, MD  raloxifene (EVISTA) 60 MG tablet Take 1 tablet (60 mg total) by  mouth daily. 07/31/14  Yes Elayne Snare, MD  ramipril (ALTACE) 10 MG capsule Take 10 mg by mouth daily.  07/28/13  Yes Historical Provider, MD  ranitidine (ZANTAC) 300 MG tablet Take 300 mg by mouth at bedtime.  04/30/13  Yes Historical Provider, MD  repaglinide (PRANDIN) 2 MG tablet Take 1 tablet (2 mg total) by mouth 3 (three) times daily before meals. 10/07/14  Yes Elayne Snare, MD  vitamin B-12 (CYANOCOBALAMIN) 1000 MCG tablet Take 1,000 mcg by mouth daily.   Yes Historical Provider, MD   BP 183/86 mmHg  Pulse 78  Temp(Src) 98 F (36.7 C) (Oral)  Resp 14  SpO2 96%  Vital signs normal   Physical Exam  Constitutional: She is oriented to person, place, and time. She appears well-developed and well-nourished.   Non-toxic appearance. She does not appear ill. No distress.  HENT:  Head: Normocephalic.    Right Ear: External ear normal.  Left Ear: External ear normal.  Nose: Nose normal. No mucosal edema or rhinorrhea.  Mouth/Throat: Oropharynx is clear and moist and mucous membranes are normal. No dental abscesses or uvula swelling.  Tender in her posterior scalp with faint bruising.   Eyes: Conjunctivae and EOM are normal. Pupils are equal, round, and reactive to light.  Neck: Normal range of motion and full passive range of motion without pain. Neck supple.  C collar in place. Was nontender to palpation through the holes in her C Collar, however, when it was removed and she started having some gentle ROM, she c/o some pain. C Collar was reapplied.   Cardiovascular: Normal rate, regular rhythm and normal heart sounds.  Exam reveals no gallop and no friction rub.   No murmur heard. Pulmonary/Chest: Effort normal and breath sounds normal. No respiratory distress. She has no wheezes. She has no rhonchi. She has no rales. She exhibits no tenderness and no crepitus.  Abdominal: Soft. Normal appearance and bowel sounds are normal. She exhibits no distension. There is no tenderness. There is no rebound and no guarding.  Musculoskeletal: Normal range of motion. She exhibits no edema or tenderness.  Moves all extremities well.  No pain on ROM  Neurological: She is alert and oriented to person, place, and time. She has normal strength. No cranial nerve deficit.  Skin: Skin is warm, dry and intact. No rash noted. No erythema. No pallor.  Psychiatric: She has a normal mood and affect. Her speech is normal and behavior is normal. Her mood appears not anxious.  Nursing note and vitals reviewed.   ED Course  Procedures (including critical care time)  Medications  acetaminophen (TYLENOL) tablet 650 mg (650 mg Oral Given 10/15/14 2246)    Pt and daughter given results of her scans.  Daughter is going to stay  with her tonight.    Labs Review Labs Reviewed - No data to display  Imaging Review Ct Head Wo Contrast Ct Cervical Spine Wo Contrast  10/15/2014   CLINICAL DATA:  Fall on wet surface with head and neck trauma and pain, initial encounter  EXAM: CT HEAD WITHOUT CONTRAST  CT CERVICAL SPINE WITHOUT CONTRAST  TECHNIQUE: Multidetector CT imaging of the head and cervical spine was performed following the standard protocol without intravenous contrast. Multiplanar CT image reconstructions of the cervical spine were also generated.  COMPARISON:  None.  FINDINGS: CT HEAD FINDINGS  The bony calvarium is intact. Mild atrophic changes are seen. Mild chronic white matter ischemic change is noted. No findings to suggest acute hemorrhage,  acute infarction or space-occupying mass lesion are noted.  CT CERVICAL SPINE FINDINGS  Seven cervical segments are well visualized. Multilevel facet hypertrophic changes are seen. No acute fracture or acute facet abnormality is noted. Visualized lung apices are within normal limits. Carotid calcifications are noted bilaterally. No gross soft tissue abnormality is noted.  IMPRESSION: CT of the head:Chronic atrophic and ischemic changes without acute abnormality.  CT of the cervical spine: Multilevel degenerative changes without acute abnormality.   Electronically Signed   By: Inez Catalina M.D.   On: 10/15/2014 22:12     EKG Interpretation None      MDM   Final diagnoses:  Fall, initial encounter  Contusion of head, initial encounter    Plan discharge  Rolland Porter, MD, Alanson Aly, MD 10/15/14 [phone]

## 2014-10-15 NOTE — ED Notes (Signed)
Per EMS, the patient comes from [place] for a fall. She was walking out in the rain and tripped over something, lost her balance with her hands full, and fell. Hit her head, and has hematoma, no blood thinners at home.  No loss of consciousness, but nausea and lightheadness when she stood up with EMS.  Hx of HTN, high cholesterol, and DM. 12 lead unremarkable. C-collar placed by ems.  20g present on arrival in left ac.

## 2014-10-15 NOTE — ED Notes (Signed)
Reported bp to Dr. Tomi Bamberger, she acknowledges, no new orders at this time

## 2014-10-15 NOTE — Discharge Instructions (Signed)
Ice packs to the sore spot in the back of your head. Have someone stay with your tonight and check on you about every 2-3 hours. You can take acetaminophen 650 mg every 8 hrs for pain if needed. Return to the ED for any problems listed on the head injury sheet.  Contusion A contusion is a deep bruise. Contusions are the result of an injury that caused bleeding under the skin. The contusion may turn blue, purple, or yellow. Minor injuries will give you a painless contusion, but more severe contusions may stay painful and swollen for a few weeks.  CAUSES  A contusion is usually caused by a blow, trauma, or direct force to an area of the body. SYMPTOMS   Swelling and redness of the injured area.  Bruising of the injured area.  Tenderness and soreness of the injured area.  Pain. DIAGNOSIS  The diagnosis can be made by taking a history and physical exam. An X-ray, CT scan, or MRI may be needed to determine if there were any associated injuries, such as fractures. TREATMENT  Specific treatment will depend on what area of the body was injured. In general, the best treatment for a contusion is resting, icing, elevating, and applying cold compresses to the injured area. Over-the-counter medicines may also be recommended for pain control. Ask your caregiver what the best treatment is for your contusion. HOME CARE INSTRUCTIONS   Put ice on the injured area.  Put ice in a plastic bag.  Place a towel between your skin and the bag.  Leave the ice on for 15-20 minutes, 3-4 times a day, or as directed by your health care provider.  Only take over-the-counter or prescription medicines for pain, discomfort, or fever as directed by your caregiver. Your caregiver may recommend avoiding anti-inflammatory medicines (aspirin, ibuprofen, and naproxen) for 48 hours because these medicines may increase bruising.  Rest the injured area.  If possible, elevate the injured area to reduce swelling. SEEK IMMEDIATE  MEDICAL CARE IF:   You have increased bruising or swelling.  You have pain that is getting worse.  Your swelling or pain is not relieved with medicines. MAKE SURE YOU:   Understand these instructions.  Will watch your condition.  Will get help right away if you are not doing well or get worse. Document Released: 08/23/2005 Document Revised: 11/18/2013 Document Reviewed: 09/18/2011 [facility] Patient Information 2015 [place], [place]. This information is not intended to replace advice given to you by your health care provider. Make sure you discuss any questions you have with your health care provider.  Head Injury You have received a head injury. It does not appear serious at this time. Headaches and vomiting are common following head injury. It should be easy to awaken from sleeping. Sometimes it is necessary for you to stay in the emergency department for a while for observation. Sometimes admission to the hospital may be needed. After injuries such as yours, most problems occur within the first 24 hours, but side effects may occur up to 7-10 days after the injury. It is important for you to carefully monitor your condition and contact your health care provider or seek immediate medical care if there is a change in your condition. WHAT ARE THE TYPES OF HEAD INJURIES? Head injuries can be as minor as a bump. Some head injuries can be more severe. More severe head injuries include:  A jarring injury to the brain (concussion).  A bruise of the brain (contusion). This mean there is  bleeding in the brain that can cause swelling.  A cracked skull (skull fracture).  Bleeding in the brain that collects, clots, and forms a bump (hematoma). WHAT CAUSES A HEAD INJURY? A serious head injury is most likely to happen to someone who is in a car wreck and is not wearing a seat belt. Other causes of major head injuries include bicycle or motorcycle accidents, sports injuries, and falls. HOW ARE HEAD  INJURIES DIAGNOSED? A complete history of the event leading to the injury and your current symptoms will be helpful in diagnosing head injuries. Many times, pictures of the brain, such as CT or MRI are needed to see the extent of the injury. Often, an overnight hospital stay is necessary for observation.  WHEN SHOULD I SEEK IMMEDIATE MEDICAL CARE?  You should get help right away if:  You have confusion or drowsiness.  You feel sick to your stomach (nauseous) or have continued, forceful vomiting.  You have dizziness or unsteadiness that is getting worse.  You have severe, continued headaches not relieved by medicine. Only take over-the-counter or prescription medicines for pain, fever, or discomfort as directed by your health care provider.  You do not have normal function of the arms or legs or are unable to walk.  You notice changes in the black spots in the center of the colored part of your eye (pupil).  You have a clear or bloody fluid coming from your nose or ears.  You have a loss of vision. During the next 24 hours after the injury, you must stay with someone who can watch you for the warning signs. This person should contact local emergency services (911 in the [place] if you have seizures, you become unconscious, or you are unable to wake up. HOW CAN I PREVENT A HEAD INJURY IN THE FUTURE? The most important factor for preventing major head injuries is avoiding motor vehicle accidents. To minimize the potential for damage to your head, it is crucial to wear seat belts while riding in motor vehicles. Wearing helmets while bike riding and playing collision sports (like football) is also helpful. Also, avoiding dangerous activities around the house will further help reduce your risk of head injury.  WHEN CAN I RETURN TO NORMAL ACTIVITIES AND ATHLETICS? You should be reevaluated by your health care provider before returning to these activities. If you have any of the following symptoms,  you should not return to activities or contact sports until 1 week after the symptoms have stopped:  Persistent headache.  Dizziness or vertigo.  Poor attention and concentration.  Confusion.  Memory problems.  Nausea or vomiting.  Fatigue or tire easily.  Irritability.  Intolerant of bright lights or loud noises.  Anxiety or depression.  Disturbed sleep. MAKE SURE YOU:   Understand these instructions.  Will watch your condition.  Will get help right away if you are not doing well or get worse. Document Released: 11/13/2005 Document Revised: 11/18/2013 Document Reviewed: 07/21/2013 [facility] Patient Information 2015 [place], [place]. This information is not intended to replace advice given to you by your health care provider. Make sure you discuss any questions you have with your health care provider.

## 2014-10-15 NOTE — ED Notes (Signed)
Dr. Tomi Bamberger gives verbal order for 650mg  of tylenol prior to discharge.

## 2014-10-15 NOTE — ED Notes (Signed)
Patient requests pain medication prior to discharge.

## 2014-10-15 NOTE — ED Notes (Signed)
Dr. Tomi Bamberger is at the bedside.

## 2014-11-28 ENCOUNTER — Other Ambulatory Visit: Payer: Self-pay | Admitting: Endocrinology

## 2014-11-30 DIAGNOSIS — K5130 Ulcerative (chronic) rectosigmoiditis without complications: Secondary | ICD-10-CM | POA: Diagnosis not present

## 2014-11-30 DIAGNOSIS — K219 Gastro-esophageal reflux disease without esophagitis: Secondary | ICD-10-CM | POA: Diagnosis not present

## 2014-11-30 DIAGNOSIS — K5900 Constipation, unspecified: Secondary | ICD-10-CM | POA: Diagnosis not present

## 2014-12-25 DIAGNOSIS — Z13820 Encounter for screening for osteoporosis: Secondary | ICD-10-CM | POA: Diagnosis not present

## 2014-12-25 DIAGNOSIS — E785 Hyperlipidemia, unspecified: Secondary | ICD-10-CM | POA: Diagnosis not present

## 2014-12-25 DIAGNOSIS — G4700 Insomnia, unspecified: Secondary | ICD-10-CM | POA: Diagnosis not present

## 2014-12-25 DIAGNOSIS — E119 Type 2 diabetes mellitus without complications: Secondary | ICD-10-CM | POA: Diagnosis not present

## 2014-12-25 DIAGNOSIS — T753XXA Motion sickness, initial encounter: Secondary | ICD-10-CM | POA: Diagnosis not present

## 2014-12-25 DIAGNOSIS — K219 Gastro-esophageal reflux disease without esophagitis: Secondary | ICD-10-CM | POA: Diagnosis not present

## 2014-12-30 ENCOUNTER — Other Ambulatory Visit: Payer: Self-pay | Admitting: *Deleted

## 2014-12-30 MED ORDER — GLUCOSE BLOOD VI STRP: ORAL_STRIP | Status: DC

## 2015-01-01 DIAGNOSIS — N189 Chronic kidney disease, unspecified: Secondary | ICD-10-CM | POA: Diagnosis not present

## 2015-01-01 DIAGNOSIS — N2581 Secondary hyperparathyroidism of renal origin: Secondary | ICD-10-CM | POA: Diagnosis not present

## 2015-01-01 DIAGNOSIS — D631 Anemia in chronic kidney disease: Secondary | ICD-10-CM | POA: Diagnosis not present

## 2015-01-01 DIAGNOSIS — N183 Chronic kidney disease, stage 3 (moderate): Secondary | ICD-10-CM | POA: Diagnosis not present

## 2015-01-06 DIAGNOSIS — M8580 Other specified disorders of bone density and structure, unspecified site: Secondary | ICD-10-CM | POA: Diagnosis not present

## 2015-01-06 DIAGNOSIS — Z13820 Encounter for screening for osteoporosis: Secondary | ICD-10-CM | POA: Diagnosis not present

## 2015-01-06 DIAGNOSIS — M8589 Other specified disorders of bone density and structure, multiple sites: Secondary | ICD-10-CM | POA: Diagnosis not present

## 2015-01-07 ENCOUNTER — Other Ambulatory Visit: Payer: Self-pay | Admitting: *Deleted

## 2015-01-07 ENCOUNTER — Ambulatory Visit: Payer: Medicare Other | Admitting: Endocrinology

## 2015-01-07 MED ORDER — REPAGLINIDE 2 MG PO TABS: 2.0000 mg | ORAL_TABLET | Freq: Three times a day (TID) | ORAL | Status: DC

## 2015-01-24 ENCOUNTER — Other Ambulatory Visit: Payer: Self-pay | Admitting: Endocrinology

## 2015-01-25 ENCOUNTER — Other Ambulatory Visit: Payer: Self-pay | Admitting: Endocrinology

## 2015-01-28 ENCOUNTER — Ambulatory Visit (INDEPENDENT_AMBULATORY_CARE_PROVIDER_SITE_OTHER): Payer: Medicare Other | Admitting: Endocrinology

## 2015-01-28 ENCOUNTER — Encounter: Payer: Self-pay | Admitting: Endocrinology

## 2015-01-28 VITALS — BP 136/68 | HR 62 | Temp 98.3°F | Resp 14 | Ht 65.0 in | Wt 171.6 lb

## 2015-01-28 DIAGNOSIS — N183 Chronic kidney disease, stage 3 unspecified: Secondary | ICD-10-CM

## 2015-01-28 DIAGNOSIS — E785 Hyperlipidemia, unspecified: Secondary | ICD-10-CM | POA: Diagnosis not present

## 2015-01-28 DIAGNOSIS — E1165 Type 2 diabetes mellitus with hyperglycemia: Secondary | ICD-10-CM | POA: Diagnosis not present

## 2015-01-28 DIAGNOSIS — IMO0002 Reserved for concepts with insufficient information to code with codable children: Secondary | ICD-10-CM

## 2015-01-28 LAB — LIPID PANEL
CHOLESTEROL: 129 mg/dL (ref 0–200)
HDL: 43.6 mg/dL (ref >39.00)
LDL Cholesterol: 64 mg/dL (ref 0–99)
NonHDL: 85.4
Total CHOL/HDL Ratio: 3
Triglycerides: 108 mg/dL (ref 0.0–149.0)
VLDL: 21.6 mg/dL (ref 0.0–40.0)

## 2015-01-28 LAB — COMPREHENSIVE METABOLIC PANEL
ALK PHOS: 39 U/L (ref 39–117)
ALT: 12 U/L (ref 0–35)
AST: 21 U/L (ref 0–37)
Albumin: 4 g/dL (ref 3.5–5.2)
BILIRUBIN TOTAL: 0.5 mg/dL (ref 0.2–1.2)
BUN: 31 mg/dL — AB (ref 6–23)
CHLORIDE: 105 meq/L (ref 96–112)
CO2: 32 mEq/L (ref 19–32)
CREATININE: 1.61 mg/dL — AB (ref 0.40–1.20)
Calcium: 9.6 mg/dL (ref 8.4–10.5)
GFR: 32.32 mL/min — ABNORMAL LOW (ref >60.00)
Glucose, Bld: 127 mg/dL — ABNORMAL HIGH (ref 70–99)
POTASSIUM: 4.1 meq/L (ref 3.5–5.1)
Sodium: 139 mEq/L (ref 135–145)
Total Protein: 6.7 g/dL (ref 6.0–8.3)

## 2015-01-28 LAB — URINALYSIS, ROUTINE W REFLEX MICROSCOPIC
BILIRUBIN URINE: NEGATIVE
Hgb urine dipstick: NEGATIVE
Ketones, ur: NEGATIVE
Leukocytes, UA: NEGATIVE
Nitrite: NEGATIVE
RBC / HPF: NONE SEEN (ref 0–?)
SPECIFIC GRAVITY, URINE: 1.02 (ref 1.000–1.030)
TOTAL PROTEIN, URINE-UPE24: NEGATIVE
Urine Glucose: NEGATIVE
Urobilinogen, UA: 0.2 (ref 0.0–1.0)
pH: 6 (ref 5.0–8.0)

## 2015-01-28 LAB — HEMOGLOBIN A1C: Hgb A1c MFr Bld: 7.1 % — ABNORMAL HIGH (ref 4.6–6.5)

## 2015-01-28 LAB — MICROALBUMIN / CREATININE URINE RATIO
Creatinine,U: 163 mg/dL
MICROALB UR: 2.2 mg/dL — AB (ref 0.0–1.9)
Microalb Creat Ratio: 1.3 mg/g (ref 0.0–30.0)

## 2015-01-28 NOTE — Progress Notes (Signed)
Patient ID: Vanessa Craig, female   DOB: 1928-01-09, 81 y.o.   MRN: [id number]   Reason for Appointment: Diabetes follow-up   History of Present Illness   Diagnosis: Type 2 DIABETES MELITUS, date of diagnosis: 1995        She has had mild and usually well-controlled diabetes for several years Previously on metformin and this was stopped because of renal dysfunction Also has been tried on low dose basal insulin previously However with using GLP-1 drugs like Byetta and Victoza her blood sugars have been fairly well controlled She has been on Victoza since 2011   Recent history: Because of variability in her blood sugars and some significantly high postprandial readings she was changed from Amaryl to Prandin 2 mg before meals in 09/2014. Her blood sugars are overall looking fairly good and she does not have blood sugars over 200 now She is a little concerned about some of her readings being in the 160-190 range but these are not consistent and only sometimes after lunch Also has checked only occasionally after supper  Previously her A1c has been consistent  around 7% and needs to have repeat level done today She is still very consistent with her exercise with her walking program Also usually watching her diet and her weight is coming down slightly Hypoglycemia: She does occasionally feel a little hypoglycemic with low normal blood sugars, mostly at lunch especially if late for a meal Her blood sugars are  fairly good fasting She is very compliant with her medications She is still taking the equivalent of 0.9 mg Victoza which does not cause nausea compared to 1.2 mg She has had some interruption of her exercise regimen but his back doing this now  Oral hypoglycemic drugs:  Actos 15 mg in a.m.Marland Kitchen  Prandin 2 mg AC       Side effects from medications: None  Monitors blood glucose: Once a day.    Glucometer: One Touch       Blood Glucose readings from meter download:   PRE-MEAL  Breakfast Lunch Dinner Bedtime Overall  Glucose range:  87-127   81, 91   146     median:  115     125   POST-MEAL PC Breakfast PC Lunch PC Dinner  Glucose range:   93-199   179, 191  Mean/median:   142             Meals: 3 meals per day. Special K Cereal in am for breakfast or eggs,variable carbohydrate intake at other meals        Physical activity: exercise: Walking 45 min 5 days a week in the mornings Dietician visit: Most recent: Years ago        Complications: are: Minimal Eye exam 3/15   Wt Readings from Last 3 Encounters:  01/28/15 171 lb 9.6 oz (77.837 kg)  10/07/14 176 lb 9.6 oz (80.105 kg)  07/07/14 174 lb 9.6 oz (79.198 kg)    Lab Results  Component Value Date   HGBA1C 7.1* 01/28/2015   HGBA1C 7.0* 10/07/2014   HGBA1C 7.2* 07/07/2014   Lab Results  Component Value Date   MICROALBUR 2.2* 01/28/2015   LDLCALC 64 01/28/2015   CREATININE 1.61* 01/28/2015      Office Visit on 01/28/2015  Component Date Value Ref Range Status  . Microalb, Ur 01/28/2015 2.2* 0.0 - 1.9 mg/dL Final  . Creatinine,U 01/28/2015 163.0   Final  . Microalb Creat Ratio 01/28/2015 1.3  0.0 - 30.0  mg/g Final  . Color, Urine 01/28/2015 YELLOW  Yellow;Lt. Yellow Final  . APPearance 01/28/2015 CLEAR  Clear Final  . Specific Gravity, Urine 01/28/2015 1.020  1.000-1.030 Final  . pH 01/28/2015 6.0  5.0 - 8.0 Final  . Total Protein, Urine 01/28/2015 NEGATIVE  Negative Final  . Urine Glucose 01/28/2015 NEGATIVE  Negative Final  . Ketones, ur 01/28/2015 NEGATIVE  Negative Final  . Bilirubin Urine 01/28/2015 NEGATIVE  Negative Final  . Hgb urine dipstick 01/28/2015 NEGATIVE  Negative Final  . Urobilinogen, UA 01/28/2015 0.2  0.0 - 1.0 Final  . Leukocytes, UA 01/28/2015 NEGATIVE  Negative Final  . Nitrite 01/28/2015 NEGATIVE  Negative Final  . WBC, UA 01/28/2015 0-2/hpf  0-2/hpf Final  . RBC / HPF 01/28/2015 none seen  0-2/hpf Final  . Squamous Epithelial / LPF 01/28/2015 Rare(0-4/hpf)   Rare(0-4/hpf) Final  . Hgb A1c MFr Bld 01/28/2015 7.1* 4.6 - 6.5 % Final   Glycemic Control Guidelines for People with Diabetes:Non Diabetic:  <6%Goal of Therapy: <7%Additional Action Suggested:  >8%   . Sodium 01/28/2015 139  135 - 145 mEq/L Final  . Potassium 01/28/2015 4.1  3.5 - 5.1 mEq/L Final  . Chloride 01/28/2015 105  96 - 112 mEq/L Final  . CO2 01/28/2015 32  19 - 32 mEq/L Final  . Glucose, Bld 01/28/2015 127* 70 - 99 mg/dL Final  . BUN 01/28/2015 31* 6 - 23 mg/dL Final  . Creatinine, Ser 01/28/2015 1.61* 0.40 - 1.20 mg/dL Final  . Total Bilirubin 01/28/2015 0.5  0.2 - 1.2 mg/dL Final  . Alkaline Phosphatase 01/28/2015 39  39 - 117 U/L Final  . AST 01/28/2015 21  0 - 37 U/L Final  . ALT 01/28/2015 12  0 - 35 U/L Final  . Total Protein 01/28/2015 6.7  6.0 - 8.3 g/dL Final  . Albumin 01/28/2015 4.0  3.5 - 5.2 g/dL Final  . Calcium 01/28/2015 9.6  8.4 - 10.5 mg/dL Final  . GFR 01/28/2015 32.32* >60.00 mL/min Final  . Cholesterol 01/28/2015 129  0 - 200 mg/dL Final   ATP III Classification       Desirable:  < 200 mg/dL               Borderline High:  200 - 239 mg/dL          High:  > = 240 mg/dL  . Triglycerides 01/28/2015 108.0  0.0 - 149.0 mg/dL Final   Normal:  <150 mg/dLBorderline High:  150 - 199 mg/dL  . HDL 01/28/2015 43.60  >39.00 mg/dL Final  . VLDL 01/28/2015 21.6  0.0 - 40.0 mg/dL Final  . LDL Cholesterol 01/28/2015 64  0 - 99 mg/dL Final  . Total CHOL/HDL Ratio 01/28/2015 3   Final                  Men          Women1/2 Average Risk     3.4          3.3Average Risk          5.0          4.42X Average Risk          9.6          7.13X Average Risk          15.0          11.0                      .  NonHDL 01/28/2015 85.40   Final   NOTE:  Non-HDL goal should be 30 mg/dL higher than patient's LDL goal (i.e. LDL goal of < 70 mg/dL, would have non-HDL goal of < 100 mg/dL)      Medication List       This list is accurate as of: 01/28/15  8:49 PM.  Always use your most  recent med list.               aspirin 81 MG tablet  Take 81 mg by mouth at bedtime.     B-D ULTRAFINE III SHORT PEN 31G X 8 MM Misc  Generic drug:  Insulin Pen Needle     carvedilol 6.25 MG tablet  Commonly known as:  COREG  Take 6.25 mg by mouth 2 (two) times daily with a meal.     fenofibrate 160 MG tablet  TAKE 1 TABLET BY MOUTH EVERY DAY     Fish Oil 1000 MG Caps  Take 1,000 mg by mouth 2 (two) times daily.     glucose blood test strip  Commonly known as:  ONE TOUCH ULTRA TEST  Use to test blood sugar once daily as instructed. Dx code: E11.65     guanFACINE 2 MG tablet  Commonly known as:  TENEX  Take 2 mg by mouth at bedtime.     hydrochlorothiazide 12.5 MG capsule  Commonly known as:  MICROZIDE  TAKE ONE CAPSULE BY MOUTH DAILY     Liraglutide 18 MG/3ML Sopn  Commonly known as:  VICTOZA  Inject 1.2 mg into the skin daily. Injects mg plus 5 clicks     magnesium oxide 400 MG tablet  Commonly known as:  MAG-OX  Take 400 mg by mouth daily.     meclizine 12.5 MG tablet  Commonly known as:  ANTIVERT     multivitamin with minerals Tabs tablet  Take 1 tablet by mouth daily.     NEXIUM 40 MG capsule  Generic drug:  esomeprazole  Take 40 mg by mouth daily.     pioglitazone 15 MG tablet  Commonly known as:  ACTOS  TAKE 1 TABLET (15 MG TOTAL) BY MOUTH DAILY.     pravastatin 80 MG tablet  Commonly known as:  PRAVACHOL  Take 80 mg by mouth daily.     raloxifene 60 MG tablet  Commonly known as:  EVISTA  Take 1 tablet (60 mg total) by mouth daily.     ramipril 10 MG capsule  Commonly known as:  ALTACE  Take 10 mg by mouth daily.     ranitidine 300 MG tablet  Commonly known as:  ZANTAC  Take 300 mg by mouth at bedtime.     repaglinide 2 MG tablet  Commonly known as:  PRANDIN  Take 1 tablet (2 mg total) by mouth 3 (three) times daily before meals.     traZODone 50 MG tablet  Commonly known as:  DESYREL     vitamin B-12 1000 MCG tablet  Commonly  known as:  CYANOCOBALAMIN  Take 1,000 mcg by mouth daily.     Vitamin D3 3000 UNITS Tabs  Take 1 capsule by mouth at bedtime.        Allergies:  Allergies  Allergen Reactions  . Codeine Nausea And Vomiting    Past Medical History  Diagnosis Date  . Diabetes mellitus without complication   . Hypertension     Past Surgical History  Procedure Laterality Date  . Appendectomy  Family History  Problem Relation Age of Onset  . Heart disease Father     Social History:  reports that she has never smoked. She has never used smokeless tobacco. She reports that she does not drink alcohol. Her drug history is not on file.  Review of Systems:  She apparently had a stress fracture of her foot without any injury.  She was not evaluated further Not clear if she has had a bone density recently She does take vitamin D3 consistently as a supplement  HYPERTENSION:  she has had long-standing hypertension treated with multiple drugs. Followed by cardiologist  Renal dysfunction: She has had abnormal renal function but relatively stable for several years, has seen nephrologist annually Creatinine was relatively higher on the last visit  No records are available from nephrologist, not clear if she has had an abnormal PTH level  Lab Results  Component Value Date   CREATININE 1.61* 01/28/2015    HYPERLIPIDEMIA: The lipid abnormality consists of elevated LDL and high triglycerides, currently on fenofibrate and pravastatin with good control.  Lab Results  Component Value Date   CHOL 129 01/28/2015   HDL 43.60 01/28/2015   LDLCALC 64 01/28/2015   TRIG 108.0 01/28/2015   CHOLHDL 3 01/28/2015     Examination:   BP 136/68 mmHg  Pulse 62  Temp(Src) 98.3 F (36.8 C)  Resp 14  Ht 5\' 5"  (1.651 m)  Wt 171 lb 9.6 oz (77.837 kg)  BMI 28.56 kg/m2  SpO2 98%  Body mass index is 28.56 kg/(m^2).   No ankle edema  ASSESSMENT/ PLAN:   Diabetes type 2   The patient's diabetes  control appears to be fairly good considering her age and duration of diabetes With changing her Amaryl to Prandin before meals for postprandial readings are somewhat better Discussed that she does not need tight control of her diabetes even though she is concerned about occasional readings in the upper 100s after meals Generally watching her diet and her weight is better this time She has been compliant with her exercise with walking 5 days a week  A1c to be checked today For now she will try to reduce her and Prandin to half a tablet before breakfast to avoid tendency to low normal readings at lunch time especially with her walking in the morning She will continue her regimen of Victoza and Actos also    ?  OSTEOPOROSIS with stress fracture.  Will need to get report of bone density done by PCP  RENAL dysfunction: Apparently related to multiple factors including glomerulosclerosis Her last creatinine was 1.7 and will check again today  Hypertension: Controlled with current regimen including ramipril 10 mg   Amanada Philbrick 01/28/2015, 8:49 PM   Labs: A1c about the same at 7.1 Creatinine 1.6 LDL and triglycerides excellent  Office Visit on 01/28/2015  Component Date Value Ref Range Status  . Microalb, Ur 01/28/2015 2.2* 0.0 - 1.9 mg/dL Final  . Creatinine,U 01/28/2015 163.0   Final  . Microalb Creat Ratio 01/28/2015 1.3  0.0 - 30.0 mg/g Final  . Color, Urine 01/28/2015 YELLOW  Yellow;Lt. Yellow Final  . APPearance 01/28/2015 CLEAR  Clear Final  . Specific Gravity, Urine 01/28/2015 1.020  1.000-1.030 Final  . pH 01/28/2015 6.0  5.0 - 8.0 Final  . Total Protein, Urine 01/28/2015 NEGATIVE  Negative Final  . Urine Glucose 01/28/2015 NEGATIVE  Negative Final  . Ketones, ur 01/28/2015 NEGATIVE  Negative Final  . Bilirubin Urine 01/28/2015 NEGATIVE  Negative Final  .  Hgb urine dipstick 01/28/2015 NEGATIVE  Negative Final  . Urobilinogen, UA 01/28/2015 0.2  0.0 - 1.0 Final  . Leukocytes, UA  01/28/2015 NEGATIVE  Negative Final  . Nitrite 01/28/2015 NEGATIVE  Negative Final  . WBC, UA 01/28/2015 0-2/hpf  0-2/hpf Final  . RBC / HPF 01/28/2015 none seen  0-2/hpf Final  . Squamous Epithelial / LPF 01/28/2015 Rare(0-4/hpf)  Rare(0-4/hpf) Final  . Hgb A1c MFr Bld 01/28/2015 7.1* 4.6 - 6.5 % Final   Glycemic Control Guidelines for People with Diabetes:Non Diabetic:  <6%Goal of Therapy: <7%Additional Action Suggested:  >8%   . Sodium 01/28/2015 139  135 - 145 mEq/L Final  . Potassium 01/28/2015 4.1  3.5 - 5.1 mEq/L Final  . Chloride 01/28/2015 105  96 - 112 mEq/L Final  . CO2 01/28/2015 32  19 - 32 mEq/L Final  . Glucose, Bld 01/28/2015 127* 70 - 99 mg/dL Final  . BUN 01/28/2015 31* 6 - 23 mg/dL Final  . Creatinine, Ser 01/28/2015 1.61* 0.40 - 1.20 mg/dL Final  . Total Bilirubin 01/28/2015 0.5  0.2 - 1.2 mg/dL Final  . Alkaline Phosphatase 01/28/2015 39  39 - 117 U/L Final  . AST 01/28/2015 21  0 - 37 U/L Final  . ALT 01/28/2015 12  0 - 35 U/L Final  . Total Protein 01/28/2015 6.7  6.0 - 8.3 g/dL Final  . Albumin 01/28/2015 4.0  3.5 - 5.2 g/dL Final  . Calcium 01/28/2015 9.6  8.4 - 10.5 mg/dL Final  . GFR 01/28/2015 32.32* >60.00 mL/min Final  . Cholesterol 01/28/2015 129  0 - 200 mg/dL Final   ATP III Classification       Desirable:  < 200 mg/dL               Borderline High:  200 - 239 mg/dL          High:  > = 240 mg/dL  . Triglycerides 01/28/2015 108.0  0.0 - 149.0 mg/dL Final   Normal:  <150 mg/dLBorderline High:  150 - 199 mg/dL  . HDL 01/28/2015 43.60  >39.00 mg/dL Final  . VLDL 01/28/2015 21.6  0.0 - 40.0 mg/dL Final  . LDL Cholesterol 01/28/2015 64  0 - 99 mg/dL Final  . Total CHOL/HDL Ratio 01/28/2015 3   Final                  Men          Women1/2 Average Risk     3.4          3.3Average Risk          5.0          4.42X Average Risk          9.6          7.13X Average Risk          15.0          11.0                      . NonHDL 01/28/2015 85.40   Final   NOTE:   Non-HDL goal should be 30 mg/dL higher than patient's LDL goal (i.e. LDL goal of < 70 mg/dL, would have non-HDL goal of < 100 mg/dL)

## 2015-01-28 NOTE — Patient Instructions (Signed)
1/2 Prandin at breakfast

## 2015-01-29 NOTE — Progress Notes (Signed)
Quick Note:  Please let patient know that the A1c is good at 7.1, kidney test stable, cholesterol good and no further action needed, fax to PCP ______

## 2015-01-31 ENCOUNTER — Other Ambulatory Visit: Payer: Self-pay | Admitting: Endocrinology

## 2015-04-27 ENCOUNTER — Other Ambulatory Visit: Payer: Medicare Other

## 2015-04-28 ENCOUNTER — Ambulatory Visit: Payer: Medicare Other | Admitting: Endocrinology

## 2015-04-30 ENCOUNTER — Ambulatory Visit: Payer: Medicare Other | Admitting: Endocrinology

## 2015-05-03 ENCOUNTER — Ambulatory Visit: Payer: Medicare Other | Admitting: Endocrinology

## 2015-05-04 DIAGNOSIS — K219 Gastro-esophageal reflux disease without esophagitis: Secondary | ICD-10-CM | POA: Diagnosis not present

## 2015-05-04 DIAGNOSIS — R112 Nausea with vomiting, unspecified: Secondary | ICD-10-CM | POA: Diagnosis not present

## 2015-05-08 DIAGNOSIS — R112 Nausea with vomiting, unspecified: Secondary | ICD-10-CM | POA: Diagnosis not present

## 2015-05-18 DIAGNOSIS — R112 Nausea with vomiting, unspecified: Secondary | ICD-10-CM | POA: Diagnosis not present

## 2015-05-18 DIAGNOSIS — K9289 Other specified diseases of the digestive system: Secondary | ICD-10-CM | POA: Diagnosis not present

## 2015-06-01 ENCOUNTER — Other Ambulatory Visit: Payer: Self-pay | Admitting: Endocrinology

## 2015-06-07 ENCOUNTER — Encounter: Payer: Self-pay | Admitting: Endocrinology

## 2015-06-07 ENCOUNTER — Ambulatory Visit (INDEPENDENT_AMBULATORY_CARE_PROVIDER_SITE_OTHER): Payer: Medicare Other | Admitting: Endocrinology

## 2015-06-07 VITALS — BP 134/76 | HR 62 | Temp 98.4°F | Resp 16 | Ht 65.0 in | Wt 167.2 lb

## 2015-06-07 DIAGNOSIS — E1165 Type 2 diabetes mellitus with hyperglycemia: Secondary | ICD-10-CM

## 2015-06-07 DIAGNOSIS — IMO0002 Reserved for concepts with insufficient information to code with codable children: Secondary | ICD-10-CM

## 2015-06-07 DIAGNOSIS — N183 Chronic kidney disease, stage 3 unspecified: Secondary | ICD-10-CM

## 2015-06-07 DIAGNOSIS — E785 Hyperlipidemia, unspecified: Secondary | ICD-10-CM | POA: Diagnosis not present

## 2015-06-07 LAB — BASIC METABOLIC PANEL
BUN: 29 mg/dL — ABNORMAL HIGH (ref 6–23)
CO2: 31 mEq/L (ref 19–32)
Calcium: 9.7 mg/dL (ref 8.4–10.5)
Chloride: 104 mEq/L (ref 96–112)
Creatinine, Ser: 1.73 mg/dL — ABNORMAL HIGH (ref 0.40–1.20)
GFR: 29.72 mL/min — ABNORMAL LOW (ref >60.00)
Glucose, Bld: 53 mg/dL — ABNORMAL LOW (ref 70–99)
Potassium: 4.3 mEq/L (ref 3.5–5.1)
Sodium: 139 mEq/L (ref 135–145)

## 2015-06-07 LAB — HEMOGLOBIN A1C: HEMOGLOBIN A1C: 6.6 % — AB (ref 4.6–6.5)

## 2015-06-07 NOTE — Patient Instructions (Addendum)
Check blood sugars on waking up .. 2 .. times a week Also check blood sugars about 2 hours after a meal and do this after different meals by rotation, more after supper  Recommended blood sugar levels on waking up is 90-130 and about 2 hours after meal is 140-180 Please bring blood sugar monitor to each visit.  May need 4mg  Prandin at supper for larger meals  Get eye exam

## 2015-06-07 NOTE — Progress Notes (Signed)
Patient ID: Vanessa Craig, female   DOB: [birth year], 82 y.o.   MRN: [id number]   Reason for Appointment: Diabetes follow-up   History of Present Illness   Diagnosis: Type 2 DIABETES MELITUS, date of diagnosis: 1995        She has had mild and usually well-controlled diabetes for several years Previously on metformin and this was stopped because of renal dysfunction Also has been tried on low dose basal insulin previously However with using GLP-1 drugs like Byetta and Victoza her blood sugars have been fairly well controlled She has been on Victoza since 2011   Recent history: Because of variability in her blood sugars and some significantly high postprandial readings she was changed from Amaryl to Prandin 2 mg before meals in 09/2014. Her blood sugars are again been checked mostly in the mid day and difficult to get an adequate blood sugar pattern  Previously her A1c has been consistent  around 7% and needs to have repeat level done today She is still very consistent with her exercise with her walking program Also usually watching her diet and her weight is coming down progressively Postprandial hyperglycemia: She has had a couple of readings that are over 200 at night and has not checked readings otherwise; she thinks higher blood sugars are when she is not eating at home and may be eating differently when she goes out Her blood sugars are  fairly good fasting  Hypoglycemia: She does rarely have any symptoms now with a low normal reading of 65 once She is very compliant with her medications She is still taking the equivalent of 0.9 mg Victoza   Oral hypoglycemic drugs:  Actos 15 mg in a.m.Marland Kitchen  Prandin 2 mg AC 3 times a day        Side effects from medications: None  Monitors blood glucose:  less than once a day.    Glucometer: One Touch       Blood Glucose readings from meter download:   Mean values apply above for all meters except median for One Touch  PRE-MEAL  Fasting Lunch Dinner Bedtime Overall  Glucose range: 107-116 65-164  207, 220   Mean/median:  111     134   Meals: 3 meals per day. Special K Cereal with cheese in am for breakfast or eggs, variable carbohydrate intake at other meals        Physical activity: exercise: Walking 45 min 5 days a week in the mornings Dietician visit: Most recent: Years ago          Wt Readings from Last 3 Encounters:  06/07/15 167 lb 3.2 oz (75.841 kg)  01/28/15 171 lb 9.6 oz (77.837 kg)  10/07/14 176 lb 9.6 oz (80.105 kg)    Lab Results  Component Value Date   HGBA1C 7.1* 01/28/2015   HGBA1C 7.0* 10/07/2014   HGBA1C 7.2* 07/07/2014   Lab Results  Component Value Date   MICROALBUR 2.2* 01/28/2015   LDLCALC 64 01/28/2015   CREATININE 1.61* 01/28/2015      No visits with results within 1 Week(s) from this visit. Latest known visit with results is:  Office Visit on 01/28/2015  Component Date Value Ref Range Status  . Microalb, Ur 01/28/2015 2.2* 0.0 - 1.9 mg/dL Final  . Creatinine,U 01/28/2015 163.0   Final  . Microalb Creat Ratio 01/28/2015 1.3  0.0 - 30.0 mg/g Final  . Color, Urine 01/28/2015 YELLOW  Yellow;Lt. Yellow Final  . APPearance 01/28/2015 CLEAR  Clear  Final  . Specific Gravity, Urine 01/28/2015 1.020  1.000-1.030 Final  . pH 01/28/2015 6.0  5.0 - 8.0 Final  . Total Protein, Urine 01/28/2015 NEGATIVE  Negative Final  . Urine Glucose 01/28/2015 NEGATIVE  Negative Final  . Ketones, ur 01/28/2015 NEGATIVE  Negative Final  . Bilirubin Urine 01/28/2015 NEGATIVE  Negative Final  . Hgb urine dipstick 01/28/2015 NEGATIVE  Negative Final  . Urobilinogen, UA 01/28/2015 0.2  0.0 - 1.0 Final  . Leukocytes, UA 01/28/2015 NEGATIVE  Negative Final  . Nitrite 01/28/2015 NEGATIVE  Negative Final  . WBC, UA 01/28/2015 0-2/hpf  0-2/hpf Final  . RBC / HPF 01/28/2015 none seen  0-2/hpf Final  . Squamous Epithelial / LPF 01/28/2015 Rare(0-4/hpf)  Rare(0-4/hpf) Final  . Hgb A1c MFr Bld 01/28/2015  7.1* 4.6 - 6.5 % Final   Glycemic Control Guidelines for People with Diabetes:Non Diabetic:  <6%Goal of Therapy: <7%Additional Action Suggested:  >8%   . Sodium 01/28/2015 139  135 - 145 mEq/L Final  . Potassium 01/28/2015 4.1  3.5 - 5.1 mEq/L Final  . Chloride 01/28/2015 105  96 - 112 mEq/L Final  . CO2 01/28/2015 32  19 - 32 mEq/L Final  . Glucose, Bld 01/28/2015 127* 70 - 99 mg/dL Final  . BUN 01/28/2015 31* 6 - 23 mg/dL Final  . Creatinine, Ser 01/28/2015 1.61* 0.40 - 1.20 mg/dL Final  . Total Bilirubin 01/28/2015 0.5  0.2 - 1.2 mg/dL Final  . Alkaline Phosphatase 01/28/2015 39  39 - 117 U/L Final  . AST 01/28/2015 21  0 - 37 U/L Final  . ALT 01/28/2015 12  0 - 35 U/L Final  . Total Protein 01/28/2015 6.7  6.0 - 8.3 g/dL Final  . Albumin 01/28/2015 4.0  3.5 - 5.2 g/dL Final  . Calcium 01/28/2015 9.6  8.4 - 10.5 mg/dL Final  . GFR 01/28/2015 32.32* >60.00 mL/min Final  . Cholesterol 01/28/2015 129  0 - 200 mg/dL Final   ATP III Classification       Desirable:  < 200 mg/dL               Borderline High:  200 - 239 mg/dL          High:  > = 240 mg/dL  . Triglycerides 01/28/2015 108.0  0.0 - 149.0 mg/dL Final   Normal:  <150 mg/dLBorderline High:  150 - 199 mg/dL  . HDL 01/28/2015 43.60  >39.00 mg/dL Final  . VLDL 01/28/2015 21.6  0.0 - 40.0 mg/dL Final  . LDL Cholesterol 01/28/2015 64  0 - 99 mg/dL Final  . Total CHOL/HDL Ratio 01/28/2015 3   Final                  Men          Women1/2 Average Risk     3.4          3.3Average Risk          5.0          4.42X Average Risk          9.6          7.13X Average Risk          15.0          11.0                      . NonHDL 01/28/2015 85.40   Final   NOTE:  Non-HDL goal should be 30  mg/dL higher than patient's LDL goal (i.e. LDL goal of < 70 mg/dL, would have non-HDL goal of < 100 mg/dL)      Medication List       This list is accurate as of: 06/07/15  1:18 PM.  Always use your most recent med list.               aspirin 81 MG  tablet  Take 81 mg by mouth at bedtime.     B-D ULTRAFINE III SHORT PEN 31G X 8 MM Misc  Generic drug:  Insulin Pen Needle     carvedilol 6.25 MG tablet  Commonly known as:  COREG  Take 6.25 mg by mouth 2 (two) times daily with a meal.     fenofibrate 160 MG tablet  TAKE 1 TABLET BY MOUTH EVERY DAY     Fish Oil 1000 MG Caps  Take 1,000 mg by mouth 2 (two) times daily.     glucose blood test strip  Commonly known as:  ONE TOUCH ULTRA TEST  Use to test blood sugar once daily as instructed. Dx code: E11.65     guanFACINE 2 MG tablet  Commonly known as:  TENEX  Take 2 mg by mouth at bedtime.     hydrochlorothiazide 12.5 MG capsule  Commonly known as:  MICROZIDE  TAKE ONE CAPSULE BY MOUTH DAILY     magnesium oxide 400 MG tablet  Commonly known as:  MAG-OX  Take 400 mg by mouth daily.     meclizine 12.5 MG tablet  Commonly known as:  ANTIVERT     multivitamin with minerals Tabs tablet  Take 1 tablet by mouth daily.     NEXIUM 40 MG capsule  Generic drug:  esomeprazole  Take 40 mg by mouth daily.     pioglitazone 15 MG tablet  Commonly known as:  ACTOS  TAKE 1 TABLET (15 MG TOTAL) BY MOUTH DAILY.     pravastatin 80 MG tablet  Commonly known as:  PRAVACHOL  Take 80 mg by mouth daily.     raloxifene 60 MG tablet  Commonly known as:  EVISTA  TAKE 1 TABLET BY MOUTH EVERY DAY     ramipril 10 MG capsule  Commonly known as:  ALTACE  Take 10 mg by mouth daily.     ranitidine 150 MG tablet  Commonly known as:  ZANTAC     repaglinide 2 MG tablet  Commonly known as:  PRANDIN  Take 1 tablet (2 mg total) by mouth 3 (three) times daily before meals.     traZODone 50 MG tablet  Commonly known as:  DESYREL     VICTOZA 18 MG/3ML Sopn  Generic drug:  Liraglutide  INJECT 1.2 MG INTO THE SKIN DAILY. INJECTS MG PLUS 5 CLICKS     vitamin 0000000 1000 MCG tablet  Commonly known as:  CYANOCOBALAMIN  Take 1,000 mcg by mouth daily.     Vitamin D3 3000 UNITS Tabs  Take 1  capsule by mouth at bedtime.        Allergies:  Allergies  Allergen Reactions  . Codeine Nausea And Vomiting    Past Medical History  Diagnosis Date  . Diabetes mellitus without complication   . Hypertension     Past Surgical History  Procedure Laterality Date  . Appendectomy      Family History  Problem Relation Age of Onset  . Heart disease Father     Social History:  reports that she has never  smoked. She has never used smokeless tobacco. She reports that she does not drink alcohol. Her drug history is not on file.  Review of Systems:  ?  Osteopenia: Not clear if she has had a bone density recently especially with her history of stress fracture of her foot She does take vitamin D3 consistently as a supplement  HYPERTENSION:  she has had long-standing hypertension treated with multiple drugs. Followed by cardiologist  Renal dysfunction: She has had abnormal renal function but relatively stable for several years, has seen nephrologist annually Creatinine was relatively higher on the last visit  No records are available from nephrologist, last PTH level in 2015 was normal  Lab Results  Component Value Date   CREATININE 1.61* 01/28/2015    HYPERLIPIDEMIA: The lipid abnormality consists of elevated LDL and high triglycerides, currently on fenofibrate and pravastatin with good control.  Lab Results  Component Value Date   CHOL 129 01/28/2015   HDL 43.60 01/28/2015   LDLCALC 64 01/28/2015   TRIG 108.0 01/28/2015   CHOLHDL 3 01/28/2015     Examination:   BP 134/76 mmHg  Pulse 62  Temp(Src) 98.4 F (36.9 C)  Resp 16  Ht 5\' 5"  (1.651 m)  Wt 167 lb 3.2 oz (75.841 kg)  BMI 27.82 kg/m2  SpO2 97%  Body mass index is 27.82 kg/(m^2).   No ankle edema Feet and normal to inspection with normal pulses  ASSESSMENT/ PLAN:   Diabetes type 2   The patient's diabetes control appears to be fairly good considering her age and duration of diabetes See history of  present illness for detailed discussion of his current management, blood sugar patterns and problems identified She may have high readings after evening meal when she is eating out but otherwise is not checking her sugars at night Most likely will need a lower dose of Prandin at breakfast and variable doses at suppertime based on what she is eating Since she has a fairly good supply of the Prandin will continue her regimen unchanged; next prescription can be for 1 mg She is unable to break the tablets in half She will continue her regimen of Victoza and Actos also Will also check her A1c today    ?  OSTEOPOROSIS with stress fracture.  Will need to get  bone density done by PCP  RENAL dysfunction: Apparently related to multiple factors including glomerulosclerosis Her last creatinine was 1.6 and will check again today  Hypertension: Controlled with current regimen including ramipril 10 mg   Nisa Decaire 06/07/2015, 1:18 PM

## 2015-06-08 NOTE — Progress Notes (Signed)
Quick Note:  Please let patient know that the A1c 6.6. Glucose was 53. Need to reduce her Prandin to 1 mg tablets for breakfast and lunch; needs new prescription for this. However she can take 2-4 mg Prandin at suppertime based on her meal size Kidney test about the same, send reports to PCP ______

## 2015-06-10 ENCOUNTER — Other Ambulatory Visit: Payer: Self-pay | Admitting: *Deleted

## 2015-06-10 MED ORDER — REPAGLINIDE 1 MG PO TABS: ORAL_TABLET | ORAL | Status: DC

## 2015-06-25 DIAGNOSIS — E119 Type 2 diabetes mellitus without complications: Secondary | ICD-10-CM | POA: Diagnosis not present

## 2015-06-25 DIAGNOSIS — K219 Gastro-esophageal reflux disease without esophagitis: Secondary | ICD-10-CM | POA: Diagnosis not present

## 2015-06-25 DIAGNOSIS — G4700 Insomnia, unspecified: Secondary | ICD-10-CM | POA: Diagnosis not present

## 2015-06-25 DIAGNOSIS — E785 Hyperlipidemia, unspecified: Secondary | ICD-10-CM | POA: Diagnosis not present

## 2015-07-12 ENCOUNTER — Other Ambulatory Visit: Payer: Self-pay | Admitting: Endocrinology

## 2015-07-13 DIAGNOSIS — Z1231 Encounter for screening mammogram for malignant neoplasm of breast: Secondary | ICD-10-CM | POA: Diagnosis not present

## 2015-07-26 ENCOUNTER — Other Ambulatory Visit: Payer: Self-pay | Admitting: Endocrinology

## 2015-08-02 ENCOUNTER — Other Ambulatory Visit: Payer: Self-pay | Admitting: Endocrinology

## 2015-08-25 DIAGNOSIS — S20211D Contusion of right front wall of thorax, subsequent encounter: Secondary | ICD-10-CM | POA: Diagnosis not present

## 2015-08-25 DIAGNOSIS — S20211A Contusion of right front wall of thorax, initial encounter: Secondary | ICD-10-CM | POA: Diagnosis not present

## 2015-08-25 DIAGNOSIS — S161XXA Strain of muscle, fascia and tendon at neck level, initial encounter: Secondary | ICD-10-CM | POA: Diagnosis not present

## 2015-08-25 DIAGNOSIS — M25572 Pain in left ankle and joints of left foot: Secondary | ICD-10-CM | POA: Diagnosis not present

## 2015-08-25 DIAGNOSIS — S20219A Contusion of unspecified front wall of thorax, initial encounter: Secondary | ICD-10-CM | POA: Diagnosis not present

## 2015-08-25 DIAGNOSIS — S9002XA Contusion of left ankle, initial encounter: Secondary | ICD-10-CM | POA: Diagnosis not present

## 2015-08-25 DIAGNOSIS — S9002XD Contusion of left ankle, subsequent encounter: Secondary | ICD-10-CM | POA: Diagnosis not present

## 2015-08-25 DIAGNOSIS — S301XXA Contusion of abdominal wall, initial encounter: Secondary | ICD-10-CM | POA: Diagnosis not present

## 2015-08-25 DIAGNOSIS — M25462 Effusion, left knee: Secondary | ICD-10-CM | POA: Diagnosis not present

## 2015-08-25 DIAGNOSIS — S8002XA Contusion of left knee, initial encounter: Secondary | ICD-10-CM | POA: Diagnosis not present

## 2015-08-25 DIAGNOSIS — S301XXD Contusion of abdominal wall, subsequent encounter: Secondary | ICD-10-CM | POA: Diagnosis not present

## 2015-08-25 DIAGNOSIS — M436 Torticollis: Secondary | ICD-10-CM | POA: Diagnosis not present

## 2015-08-25 DIAGNOSIS — M25562 Pain in left knee: Secondary | ICD-10-CM | POA: Diagnosis not present

## 2015-08-25 DIAGNOSIS — R109 Unspecified abdominal pain: Secondary | ICD-10-CM | POA: Diagnosis not present

## 2015-08-25 DIAGNOSIS — R079 Chest pain, unspecified: Secondary | ICD-10-CM | POA: Diagnosis not present

## 2015-08-25 DIAGNOSIS — S20212A Contusion of left front wall of thorax, initial encounter: Secondary | ICD-10-CM | POA: Diagnosis not present

## 2015-08-25 DIAGNOSIS — R0781 Pleurodynia: Secondary | ICD-10-CM | POA: Diagnosis not present

## 2015-08-25 DIAGNOSIS — S20212D Contusion of left front wall of thorax, subsequent encounter: Secondary | ICD-10-CM | POA: Diagnosis not present

## 2015-09-06 DIAGNOSIS — R0781 Pleurodynia: Secondary | ICD-10-CM | POA: Diagnosis not present

## 2015-09-06 DIAGNOSIS — M25462 Effusion, left knee: Secondary | ICD-10-CM | POA: Diagnosis not present

## 2015-09-06 DIAGNOSIS — S8001XA Contusion of right knee, initial encounter: Secondary | ICD-10-CM | POA: Diagnosis not present

## 2015-09-07 ENCOUNTER — Ambulatory Visit: Payer: Medicare Other | Admitting: Endocrinology

## 2015-09-15 DIAGNOSIS — M1712 Unilateral primary osteoarthritis, left knee: Secondary | ICD-10-CM | POA: Diagnosis not present

## 2015-09-27 DIAGNOSIS — E119 Type 2 diabetes mellitus without complications: Secondary | ICD-10-CM | POA: Diagnosis not present

## 2015-09-29 ENCOUNTER — Other Ambulatory Visit: Payer: Self-pay | Admitting: *Deleted

## 2015-09-29 MED ORDER — REPAGLINIDE 1 MG PO TABS: ORAL_TABLET | ORAL | Status: DC

## 2015-10-08 ENCOUNTER — Encounter: Payer: Self-pay | Admitting: Endocrinology

## 2015-10-08 ENCOUNTER — Ambulatory Visit (INDEPENDENT_AMBULATORY_CARE_PROVIDER_SITE_OTHER): Payer: Medicare Other | Admitting: Endocrinology

## 2015-10-08 VITALS — BP 150/90 | HR 61 | Temp 98.3°F | Resp 14 | Ht 65.0 in | Wt 161.4 lb

## 2015-10-08 DIAGNOSIS — E1165 Type 2 diabetes mellitus with hyperglycemia: Secondary | ICD-10-CM

## 2015-10-08 DIAGNOSIS — E119 Type 2 diabetes mellitus without complications: Secondary | ICD-10-CM | POA: Diagnosis not present

## 2015-10-08 DIAGNOSIS — IMO0002 Reserved for concepts with insufficient information to code with codable children: Secondary | ICD-10-CM

## 2015-10-08 DIAGNOSIS — I10 Essential (primary) hypertension: Secondary | ICD-10-CM

## 2015-10-08 DIAGNOSIS — E785 Hyperlipidemia, unspecified: Secondary | ICD-10-CM

## 2015-10-08 LAB — COMPREHENSIVE METABOLIC PANEL
ALBUMIN: 4 g/dL (ref 3.5–5.2)
ALK PHOS: 47 U/L (ref 39–117)
ALT: 15 U/L (ref 0–35)
AST: 21 U/L (ref 0–37)
BILIRUBIN TOTAL: 0.5 mg/dL (ref 0.2–1.2)
BUN: 23 mg/dL (ref 6–23)
CO2: 32 mEq/L (ref 19–32)
Calcium: 10.2 mg/dL (ref 8.4–10.5)
Chloride: 102 mEq/L (ref 96–112)
Creatinine, Ser: 1.71 mg/dL — ABNORMAL HIGH (ref 0.40–1.20)
GFR: 30.1 mL/min — AB (ref >60.00)
GLUCOSE: 93 mg/dL (ref 70–99)
POTASSIUM: 4.1 meq/L (ref 3.5–5.1)
Sodium: 140 mEq/L (ref 135–145)
TOTAL PROTEIN: 6.7 g/dL (ref 6.0–8.3)

## 2015-10-08 LAB — LIPID PANEL
CHOLESTEROL: 147 mg/dL (ref 0–200)
HDL: 44.7 mg/dL (ref >39.00)
LDL Cholesterol: 79 mg/dL (ref 0–99)
NONHDL: 102.45
Total CHOL/HDL Ratio: 3
Triglycerides: 117 mg/dL (ref 0.0–149.0)
VLDL: 23.4 mg/dL (ref 0.0–40.0)

## 2015-10-08 LAB — POCT GLYCOSYLATED HEMOGLOBIN (HGB A1C): HEMOGLOBIN A1C: 6.9

## 2015-10-08 MED ORDER — AMLODIPINE BESYLATE 5 MG PO TABS: 5.0000 mg | ORAL_TABLET | Freq: Every day | ORAL | Status: DC

## 2015-10-08 NOTE — Progress Notes (Signed)
Patient ID: Vanessa Craig, female   DOB: [birth year], 81 y.o.   MRN: [id number]   Reason for Appointment: Diabetes follow-up   History of Present Illness   Diagnosis: Type 2 DIABETES MELITUS, date of diagnosis: 1995        She has had mild and usually well-controlled diabetes for several years Previously on metformin and this was stopped because of renal dysfunction Also has been tried on low dose basal insulin previously However with using GLP-1 drugs like Byetta and Victoza her blood sugars have been fairly well controlled She has been on Victoza since 2011   Recent history:  Oral hypoglycemic drugs:  Actos 15 mg in a.m.Marland Kitchen  Prandin 1 mg before breakfast, 2 mg before lunch and 3 mg before supper      Because of variability in her blood sugars and some significantly high postprandial readings she was changed from Amaryl to Prandin 2 mg before meals in 09/2014.    Current management, blood sugar patterns and problems identified:  Her blood sugars are occasionally higher after meals and over 200 after either lunch or supper periodically.  She thinks this is related to eating out are higher carbohydrate meals   She does not adjust her Prandin dose based on what she is eating but does usually compliant with taking it before every meal   she is tolerating Victoza quite well   because of various issues she has not been able to exercise consistently as before and only walking in the last 10 days or so  Her blood sugars are  fairly good fasting  Hypoglycemia: She does rarely have any symptoms now with a low normal reading of 65 once She is very compliant with her medications She is still taking the equivalent of 0.9 mg Victoza   Side effects from medications: None  Monitors blood glucose:  less than once a day.    Glucometer: One Touch       Blood Glucose readings from meter download:   Mean values apply above for all meters except median for One Touch  PRE-MEAL Fasting  Lunch Dinner Bedtime Overall  Glucose range:  115-138  97     Mean/median:      147+/-49   POST-MEAL PC Breakfast PC Lunch PC Dinner  Glucose range:   121-207  125-267  Mean/median:   187  155   Meals: 3 meals per day. Dinner 7  Special K Cereal with cheese in am for breakfast or eggs, variable carbohydrate intake at other meals        Physical activity: exercise: Walking 45 min 5 days a week in the mornings, for 10 days Dietician visit: Most recent: Years ago          Wt Readings from Last 3 Encounters:  10/08/15 161 lb 6.4 oz (73.211 kg)  06/07/15 167 lb 3.2 oz (75.841 kg)  01/28/15 171 lb 9.6 oz (77.837 kg)    Lab Results  Component Value Date   HGBA1C 6.9 10/08/2015   HGBA1C 6.6* 06/07/2015   HGBA1C 7.1* 01/28/2015   Lab Results  Component Value Date   MICROALBUR 2.2* 01/28/2015   LDLCALC 64 01/28/2015   CREATININE 1.73* 06/07/2015      Office Visit on 10/08/2015  Component Date Value Ref Range Status  . Hemoglobin A1C 10/08/2015 6.9   Final      Medication List       This list is accurate as of: 10/08/15  1:10 PM.  Always use  your most recent med list.               amLODipine 5 MG tablet  Commonly known as:  NORVASC  Take 1 tablet (5 mg total) by mouth daily.     aspirin 81 MG tablet  Take 81 mg by mouth at bedtime.     B-D ULTRAFINE III SHORT PEN 31G X 8 MM Misc  Generic drug:  Insulin Pen Needle     carvedilol 6.25 MG tablet  Commonly known as:  COREG  Take 6.25 mg by mouth 2 (two) times daily with a meal.     fenofibrate 160 MG tablet  TAKE 1 TABLET BY MOUTH EVERY DAY     Fish Oil 1000 MG Caps  Take 1,000 mg by mouth 2 (two) times daily.     glucose blood test strip  Commonly known as:  ONE TOUCH ULTRA TEST  Use to test blood sugar once daily as instructed. Dx code: E11.65     guanFACINE 2 MG tablet  Commonly known as:  TENEX  Take 2 mg by mouth at bedtime.     hydrochlorothiazide 12.5 MG capsule  Commonly known as:  MICROZIDE    TAKE ONE CAPSULE BY MOUTH DAILY     magnesium oxide 400 MG tablet  Commonly known as:  MAG-OX  Take 400 mg by mouth daily.     meclizine 12.5 MG tablet  Commonly known as:  ANTIVERT     multivitamin with minerals Tabs tablet  Take 1 tablet by mouth daily.     NEXIUM 40 MG capsule  Generic drug:  esomeprazole  Take 40 mg by mouth daily.     pioglitazone 15 MG tablet  Commonly known as:  ACTOS  TAKE 1 TABLET (15 MG TOTAL) BY MOUTH DAILY.     pravastatin 80 MG tablet  Commonly known as:  PRAVACHOL  Take 80 mg by mouth daily.     raloxifene 60 MG tablet  Commonly known as:  EVISTA  TAKE 1 TABLET BY MOUTH EVERY DAY     ramipril 10 MG capsule  Commonly known as:  ALTACE  Take 10 mg by mouth daily.     ranitidine 150 MG tablet  Commonly known as:  ZANTAC     repaglinide 1 MG tablet  Commonly known as:  PRANDIN  Take 1 tablet at breakfast and lunch and 2-4 tablets at supper depending on meal size.     traZODone 50 MG tablet  Commonly known as:  DESYREL     VICTOZA 18 MG/3ML Sopn  Generic drug:  Liraglutide  INJECT 1.2 MG INTO THE SKIN DAILY. INJECTS MG PLUS 5 CLICKS     vitamin 0000000 1000 MCG tablet  Commonly known as:  CYANOCOBALAMIN  Take 1,000 mcg by mouth daily.     Vitamin D3 3000 UNITS Tabs  Take 1 capsule by mouth at bedtime.        Allergies:  Allergies  Allergen Reactions  . Codeine Nausea And Vomiting    Past Medical History  Diagnosis Date  . Diabetes mellitus without complication ([place])   . Hypertension     Past Surgical History  Procedure Laterality Date  . Appendectomy      Family History  Problem Relation Age of Onset  . Heart disease Father     Social History:  reports that she has never smoked. She has never used smokeless tobacco. She reports that she does not drink alcohol. Her drug history is not  on file.  Review of Systems:    Osteopenia:  She still has not had had a bone density recently  Despite her age and with her  history of stress fracture of her foot She does take vitamin D3 consistently as a supplement  HYPERTENSION:  she has had long-standing hypertension treated with multiple drugs. Followed by cardiologist  home blood pressure checked with the risk of =135/80  Renal dysfunction: She has had abnormal renal function but relatively stable for several years, has seen nephrologist annually  No records are available from nephrologist, last PTH level in 2015 was normal  Lab Results  Component Value Date   CREATININE 1.73* 06/07/2015    HYPERLIPIDEMIA: The lipid abnormality consists of elevated LDL and high triglycerides, on fenofibrate and pravastatin with good control.  Lab Results  Component Value Date   CHOL 129 01/28/2015   HDL 43.60 01/28/2015   LDLCALC 64 01/28/2015   TRIG 108.0 01/28/2015   CHOLHDL 3 01/28/2015     Examination:   BP 150/90 mmHg  Pulse 61  Temp(Src) 98.3 F (36.8 C)  Resp 14  Ht 5\' 5"  (1.651 m)  Wt 161 lb 6.4 oz (73.211 kg)  BMI 26.86 kg/m2  SpO2 96%  Body mass index is 26.86 kg/(m^2).   No ankle edema     Repeat blood pressure 168/ 84 with large cuff  ASSESSMENT/ PLAN:   Diabetes type 2   The patient's diabetes control is fairly good considering her age and duration of diabetes A1c is still under 7 See history of present illness for detailed discussion of his current management, blood sugar patterns and problems identified  Although she continues to lose weight she is still requiring pre-meal dosing of Prandin especially at her evening meal Still has high readings at times based on her intake but fasting readings are actually No hypoglycemia also She has not exercised regularly and is planning to start now Advised her to add an extra 1 mg Prandin if she is planning to eat a larger meal or more carbohydrate She may have high readings after  She will continue her regimen of Victoza and Actos also Will also check her A1c today   HYPERTENSION: Blood  pressure is significantly higher than usual for her home monitor is probably inaccurate She will add amlodipine to her current regimen and follow up with her PCP or cardiologist   ?  OSTEOPOROSIS with stress fracture.  Will need to get  bone density done by PCP, she has multiple risk factors for osteoporosis  RENAL dysfunction: Apparently related to multiple factors including glomerulosclerosis Her last creatinine was 1.7 and will check again today    Khary Schaben 10/08/2015, 1:10 PM

## 2015-10-08 NOTE — Patient Instructions (Addendum)
Add extra Prandin at lunch or dinner if eating more carbs or going out  Check blood sugars on waking up 2  times a week Also check blood sugars about 2 hours after a meal and do this after different meals by rotation  Recommended blood sugar levels on waking up is 90-130 and about 2 hours after meal is 130-160  Please bring your blood sugar monitor to each visit, thank you  Start amlodipine for BP

## 2015-10-11 NOTE — Progress Notes (Signed)
Quick Note:  Please let patient know that the kidney test is stable, lipids okay   please send results to PCP, nephrologist and Dr. Bettina Gavia ______

## 2015-10-13 DIAGNOSIS — M1712 Unilateral primary osteoarthritis, left knee: Secondary | ICD-10-CM | POA: Diagnosis not present

## 2015-11-03 DIAGNOSIS — M1712 Unilateral primary osteoarthritis, left knee: Secondary | ICD-10-CM | POA: Diagnosis not present

## 2015-11-10 DIAGNOSIS — M1712 Unilateral primary osteoarthritis, left knee: Secondary | ICD-10-CM | POA: Diagnosis not present

## 2015-11-16 ENCOUNTER — Other Ambulatory Visit: Payer: Self-pay | Admitting: *Deleted

## 2015-11-16 MED ORDER — GLUCOSE BLOOD VI STRP: ORAL_STRIP | Status: DC

## 2015-11-17 DIAGNOSIS — M1712 Unilateral primary osteoarthritis, left knee: Secondary | ICD-10-CM | POA: Diagnosis not present

## 2015-11-30 DIAGNOSIS — M47892 Other spondylosis, cervical region: Secondary | ICD-10-CM | POA: Diagnosis not present

## 2015-11-30 DIAGNOSIS — M542 Cervicalgia: Secondary | ICD-10-CM | POA: Diagnosis not present

## 2015-11-30 DIAGNOSIS — M47812 Spondylosis without myelopathy or radiculopathy, cervical region: Secondary | ICD-10-CM | POA: Diagnosis not present

## 2015-11-30 DIAGNOSIS — M4312 Spondylolisthesis, cervical region: Secondary | ICD-10-CM | POA: Diagnosis not present

## 2015-12-27 DIAGNOSIS — M542 Cervicalgia: Secondary | ICD-10-CM | POA: Diagnosis not present

## 2015-12-27 DIAGNOSIS — M25512 Pain in left shoulder: Secondary | ICD-10-CM | POA: Diagnosis not present

## 2015-12-27 DIAGNOSIS — Z79899 Other long term (current) drug therapy: Secondary | ICD-10-CM | POA: Diagnosis not present

## 2015-12-27 DIAGNOSIS — M25511 Pain in right shoulder: Secondary | ICD-10-CM | POA: Diagnosis not present

## 2015-12-31 ENCOUNTER — Other Ambulatory Visit: Payer: Self-pay | Admitting: *Deleted

## 2015-12-31 MED ORDER — AMLODIPINE BESYLATE 5 MG PO TABS: 5.0000 mg | ORAL_TABLET | Freq: Every day | ORAL | Status: DC

## 2016-01-06 ENCOUNTER — Ambulatory Visit: Payer: Medicare Other | Admitting: Endocrinology

## 2016-01-06 IMAGING — CT CT HEAD W/O CM
3 of 5 series · 15 of 47 positions shown, 18 images · non-contrast
Comparison: None.

CLINICAL DATA: Fall on wet surface with head and neck trauma and
pain, initial encounter

EXAM:
CT HEAD WITHOUT CONTRAST
CT CERVICAL SPINE WITHOUT CONTRAST
TECHNIQUE: Multidetector CT imaging of the head and cervical spine was
performed following the standard protocol without intravenous
contrast. Multiplanar CT image reconstructions of the cervical spine
were also generated.

[Series 8: coronals · coronal · 0.32mm/px · 3 of 74 slices shown]
[im 25/74  brain]
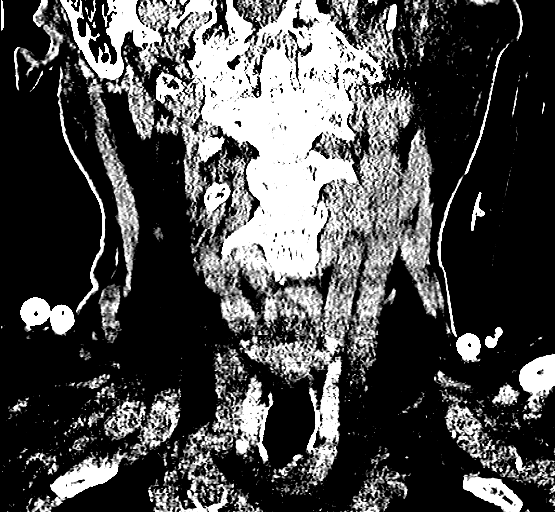
[im 33/74  brain]
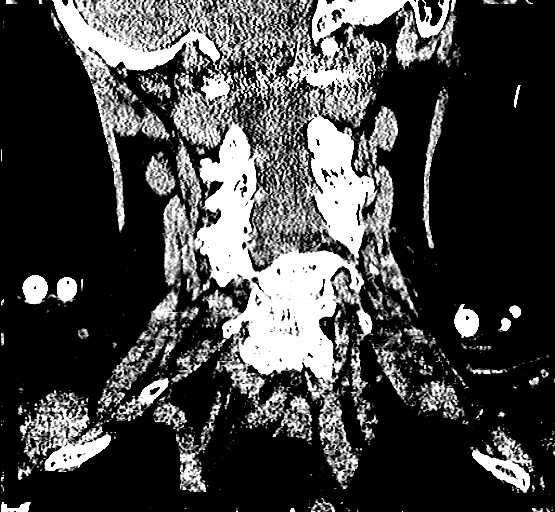
[im 41/74  brain]
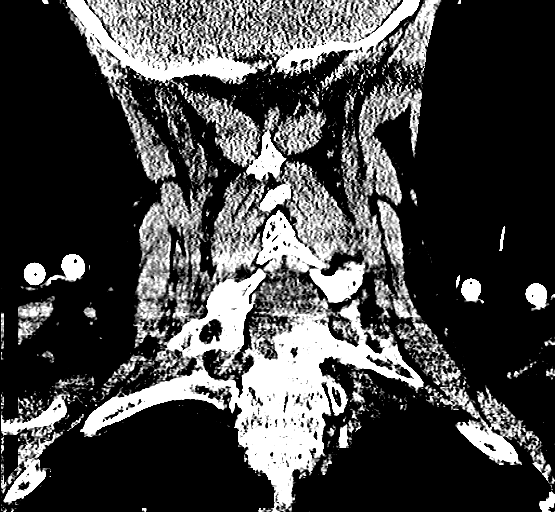

[Series 9: sagittals · sagittal · 0.27mm/px · 3 of 67 slices shown]
[im 23/67  brain]
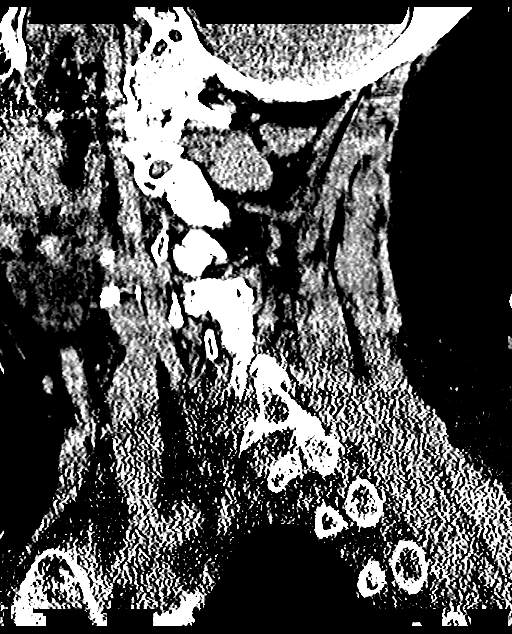
[im 34/67  brain]
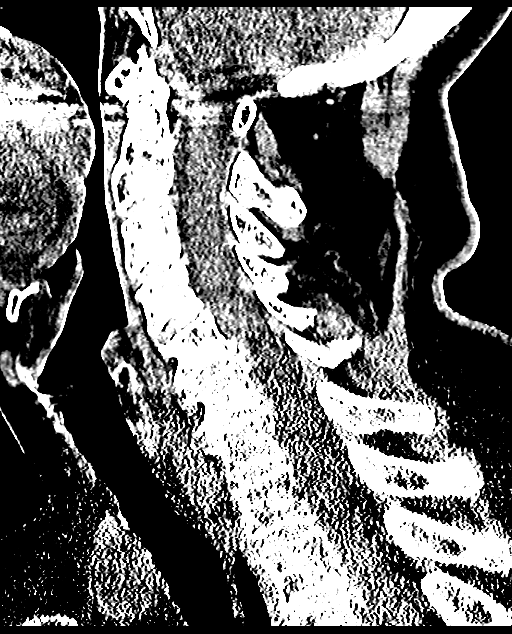
[im 45/67  brain]
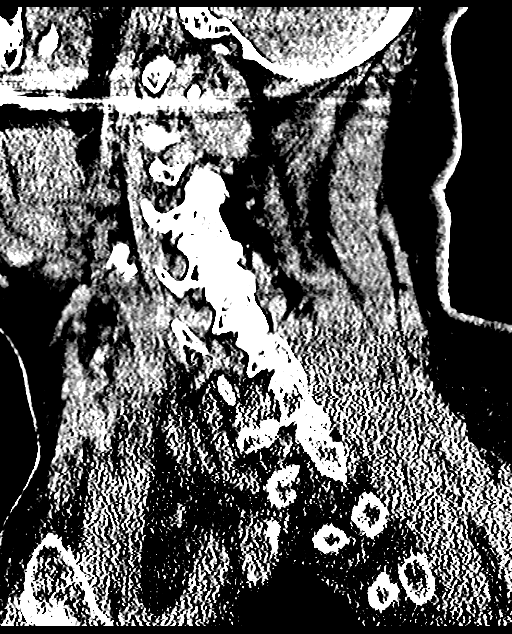

[Series 10: orthogonals · axial · 0.32mm/px · z∈[-158,-27]mm · 9 of 87 slices shown, 12 images]
[im 8/87  brain]
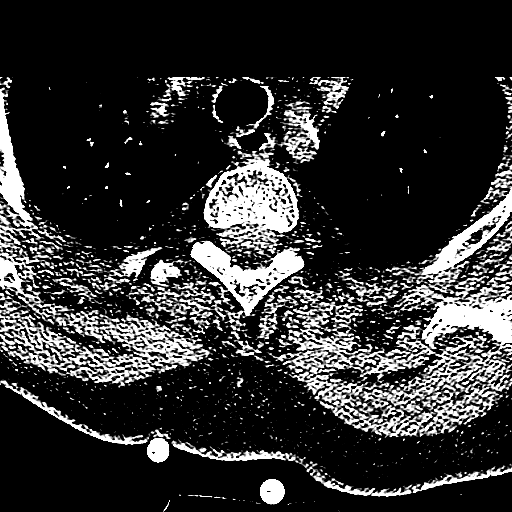
[im 8/87  bone]
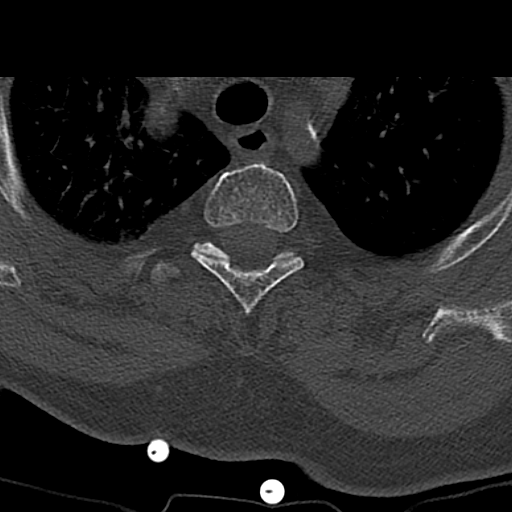
[im 15/87  brain]
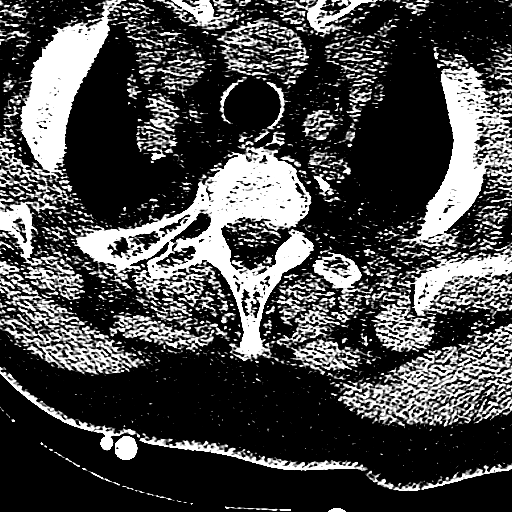
[im 29/87  brain]
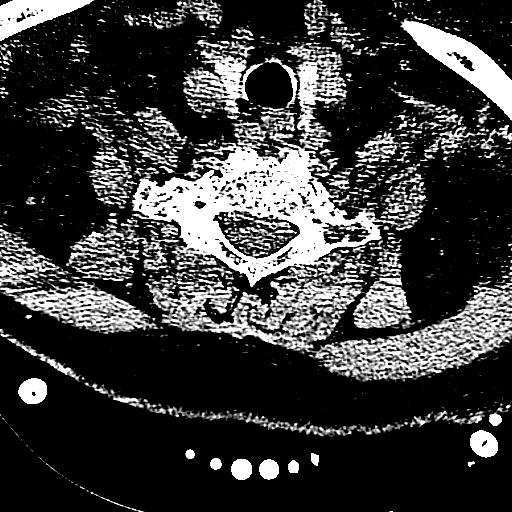
[im 36/87  brain]
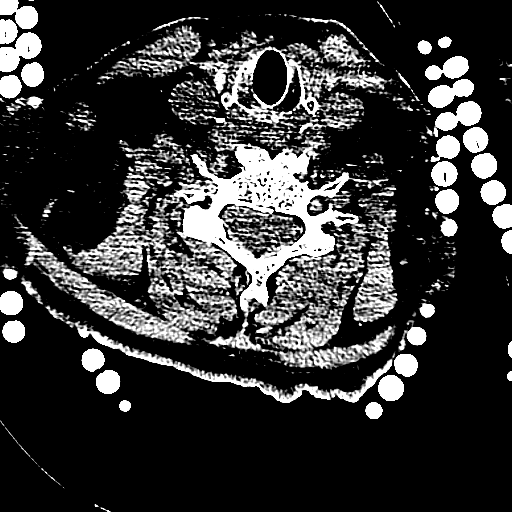
[im 44/87  brain]
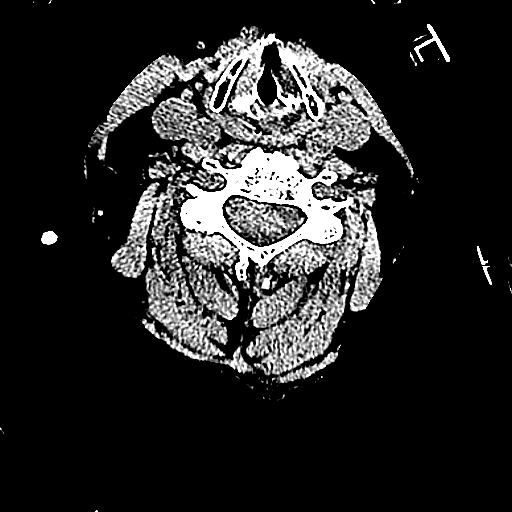
[im 44/87  bone]
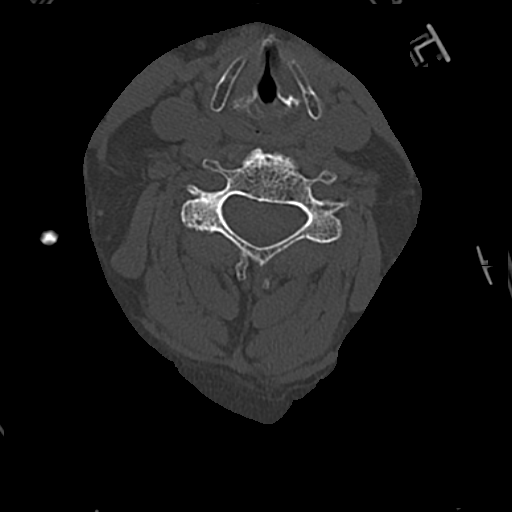
[im 51/87  brain]
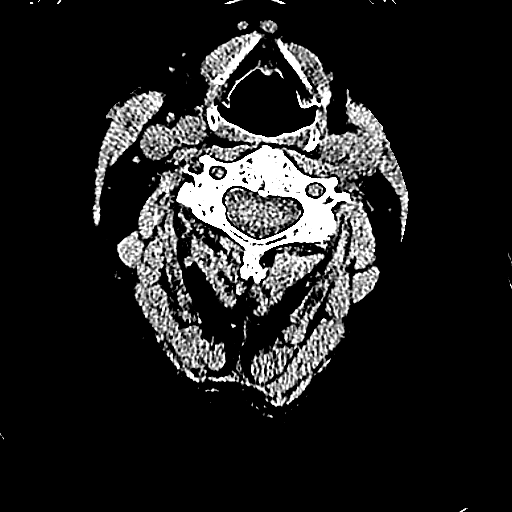
[im 58/87  brain]
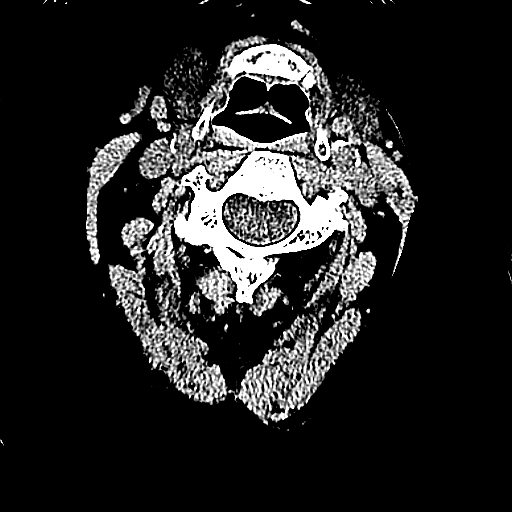
[im 72/87  brain]
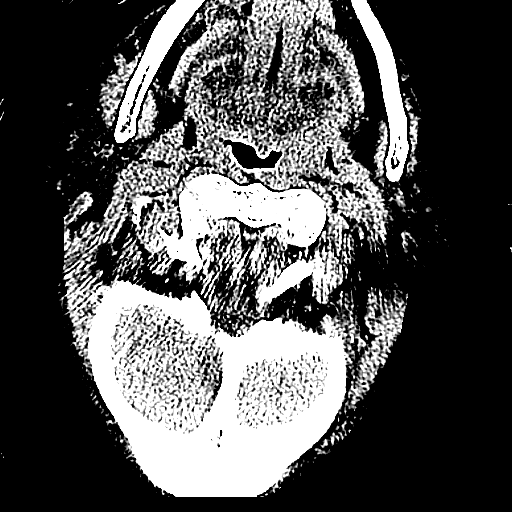
[im 79/87  brain]
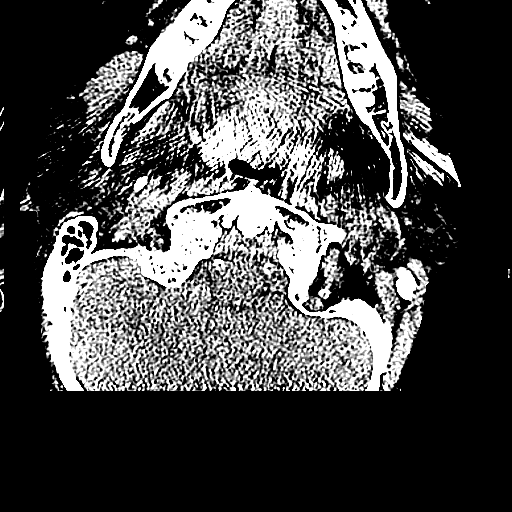
[im 79/87  bone]
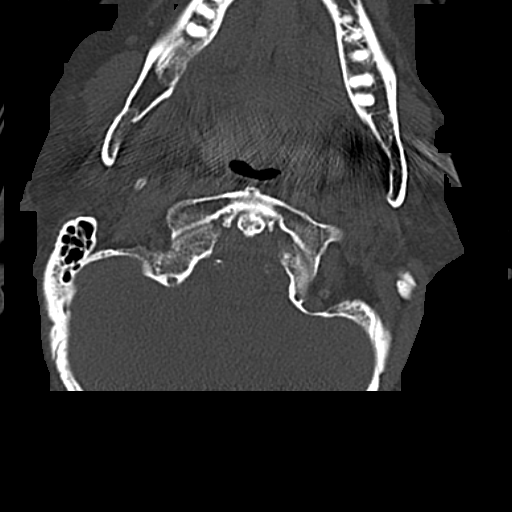

[15 of 47 positions shown; findings below may reference images not displayed]

FINDINGS: CT HEAD FINDINGS

The bony calvarium is intact. Mild atrophic changes are seen. Mild
chronic white matter ischemic change is noted. No findings to
suggest acute hemorrhage, acute infarction or space-occupying mass
lesion are noted.

CT CERVICAL SPINE FINDINGS

Seven cervical segments are well visualized. Multilevel facet
hypertrophic changes are seen. No acute fracture or acute facet
abnormality is noted. Visualized lung apices are within normal
limits. Carotid calcifications are noted bilaterally. No gross soft
tissue abnormality is noted.
IMPRESSION: CT of the head:Chronic atrophic and ischemic changes without acute
abnormality.

CT of the cervical spine: Multilevel degenerative changes without
acute abnormality.

## 2016-01-18 ENCOUNTER — Other Ambulatory Visit: Payer: Self-pay | Admitting: *Deleted

## 2016-01-18 ENCOUNTER — Telehealth: Payer: Self-pay | Admitting: Endocrinology

## 2016-01-18 ENCOUNTER — Other Ambulatory Visit: Payer: Self-pay | Admitting: Endocrinology

## 2016-01-18 MED ORDER — AMLODIPINE BESYLATE 5 MG PO TABS: 5.0000 mg | ORAL_TABLET | Freq: Every day | ORAL | Status: DC

## 2016-01-18 NOTE — Telephone Encounter (Signed)
Pt needs 90 pills of amlodipine called to [facility] in [place] # [phone]  All meds have to be 90 pills pt is asking that we remember this

## 2016-01-18 NOTE — Telephone Encounter (Signed)
rx sent for a 90 day supply

## 2016-01-26 ENCOUNTER — Other Ambulatory Visit: Payer: Self-pay | Admitting: Endocrinology

## 2016-01-29 ENCOUNTER — Other Ambulatory Visit: Payer: Self-pay | Admitting: Endocrinology

## 2016-02-01 ENCOUNTER — Ambulatory Visit: Payer: Medicare Other | Admitting: Endocrinology

## 2016-02-08 ENCOUNTER — Other Ambulatory Visit: Payer: Self-pay | Admitting: Endocrinology

## 2016-02-17 DIAGNOSIS — M542 Cervicalgia: Secondary | ICD-10-CM | POA: Diagnosis not present

## 2016-02-21 DIAGNOSIS — M50222 Other cervical disc displacement at C5-C6 level: Secondary | ICD-10-CM | POA: Diagnosis not present

## 2016-02-21 DIAGNOSIS — M4692 Unspecified inflammatory spondylopathy, cervical region: Secondary | ICD-10-CM | POA: Diagnosis not present

## 2016-02-21 DIAGNOSIS — M50223 Other cervical disc displacement at C6-C7 level: Secondary | ICD-10-CM | POA: Diagnosis not present

## 2016-02-25 ENCOUNTER — Encounter: Payer: Self-pay | Admitting: Endocrinology

## 2016-02-25 ENCOUNTER — Other Ambulatory Visit: Payer: Self-pay | Admitting: Endocrinology

## 2016-02-25 ENCOUNTER — Ambulatory Visit (INDEPENDENT_AMBULATORY_CARE_PROVIDER_SITE_OTHER): Payer: Medicare Other | Admitting: Endocrinology

## 2016-02-25 VITALS — BP 148/62 | HR 87 | Temp 97.8°F | Resp 14 | Ht 65.0 in | Wt 160.4 lb

## 2016-02-25 DIAGNOSIS — E1165 Type 2 diabetes mellitus with hyperglycemia: Secondary | ICD-10-CM

## 2016-02-25 DIAGNOSIS — E119 Type 2 diabetes mellitus without complications: Secondary | ICD-10-CM | POA: Diagnosis not present

## 2016-02-25 LAB — BASIC METABOLIC PANEL
BUN: 24 mg/dL — AB (ref 6–23)
CHLORIDE: 100 meq/L (ref 96–112)
CO2: 32 mEq/L (ref 19–32)
Calcium: 9.9 mg/dL (ref 8.4–10.5)
Creatinine, Ser: 1.59 mg/dL — ABNORMAL HIGH (ref 0.40–1.20)
GFR: 32.7 mL/min — ABNORMAL LOW (ref >60.00)
GLUCOSE: 228 mg/dL — AB (ref 70–99)
POTASSIUM: 4.1 meq/L (ref 3.5–5.1)
Sodium: 136 mEq/L (ref 135–145)

## 2016-02-25 LAB — MICROALBUMIN / CREATININE URINE RATIO
Creatinine,U: 225.2 mg/dL
MICROALB UR: 6.8 mg/dL — AB (ref 0.0–1.9)
Microalb Creat Ratio: 3 mg/g (ref 0.0–30.0)

## 2016-02-25 LAB — POCT GLYCOSYLATED HEMOGLOBIN (HGB A1C): HEMOGLOBIN A1C: 7.2

## 2016-02-25 MED ORDER — REPAGLINIDE 2 MG PO TABS: ORAL_TABLET | ORAL | Status: DC

## 2016-02-25 NOTE — Progress Notes (Signed)
Patient ID: Vanessa Craig, female   DOB: November 06, 1928, 82 y.o.   MRN: [id number]   Reason for Appointment: Diabetes follow-up   History of Present Illness   Diagnosis: Type 2 DIABETES MELITUS, date of diagnosis: 1995        She has had mild and usually well-controlled diabetes for several years Previously on metformin and this was stopped because of renal dysfunction Also has been tried on low dose basal insulin previously However with using GLP-1 drugs like Byetta and Victoza her blood sugars have been fairly well controlled She has been on Victoza since 2011   Recent history:  Non-insulin hypoglycemic drugs:  Actos 15 mg in a.m.Marland Kitchen  Prandin 1 mg before breakfast, 2 mg before lunch and 3 mg before supper  Victoza 0.9 mg daily      Because of variability in her blood sugars and some significantly high postprandial readings she was changed from Amaryl to Prandin 2 mg before meals in 09/2014. Her A1c is pending from today, previously slightly higher at 6.9    Current management, blood sugar patterns and problems identified:  She is checking her blood sugars less often and has some readings in the afternoon and most reading before supper  Blood sugars are significantly higher in the late afternoon and evenings  Fasting blood sugars are fairly good usually  She has not been able to do any walking more recently because of intercurrent illness  HIGHEST blood sugar was 325 and she had short course of prednisone before that.  Recently also blood sugars appear to be persistently high  Hypoglycemia: None  Side effects from medications: None  Monitors blood glucose:  less than once a day.    Glucometer: One Touch       Blood Glucose readings from meter download:   Mean values apply above for all meters except median for One Touch  PRE-MEAL Fasting Lunch Dinner Bedtime Overall  Glucose range: 121-141   145-325  265, 217    Mean/median:   194  [id number]     Meals: 3 meals  per day. Dinner 7pm  Special K Cereal with cheese in am for breakfast or eggs, variable carbohydrate intake at other meals        Physical activity: exercise: Walking 45 min 0-5 days a week in the mornings   Dietician visit: Most recent: Years ago          Wt Readings from Last 3 Encounters:  02/25/16 160 lb 6.4 oz (72.757 kg)  10/08/15 161 lb 6.4 oz (73.211 kg)  06/07/15 167 lb 3.2 oz (75.841 kg)    Lab Results  Component Value Date   HGBA1C 6.9 10/08/2015   HGBA1C 6.6* 06/07/2015   HGBA1C 7.1* 01/28/2015   Lab Results  Component Value Date   MICROALBUR 2.2* 01/28/2015   LDLCALC 79 10/08/2015   CREATININE 1.71* 10/08/2015      No visits with results within 1 Week(s) from this visit. Latest known visit with results is:  Office Visit on 10/08/2015  Component Date Value Ref Range Status  . Hemoglobin A1C 10/08/2015 6.9   Final  . Sodium 10/08/2015 140  135 - 145 mEq/L Final  . Potassium 10/08/2015 4.1  3.5 - 5.1 mEq/L Final  . Chloride 10/08/2015 102  96 - 112 mEq/L Final  . CO2 10/08/2015 32  19 - 32 mEq/L Final  . Glucose, Bld 10/08/2015 93  70 - 99 mg/dL Final  . BUN 10/08/2015 23  6 -  23 mg/dL Final  . Creatinine, Ser 10/08/2015 1.71* 0.40 - 1.20 mg/dL Final  . Total Bilirubin 10/08/2015 0.5  0.2 - 1.2 mg/dL Final  . Alkaline Phosphatase 10/08/2015 47  39 - 117 U/L Final  . AST 10/08/2015 21  0 - 37 U/L Final  . ALT 10/08/2015 15  0 - 35 U/L Final  . Total Protein 10/08/2015 6.7  6.0 - 8.3 g/dL Final  . Albumin 10/08/2015 4.0  3.5 - 5.2 g/dL Final  . Calcium 10/08/2015 10.2  8.4 - 10.5 mg/dL Final  . GFR 10/08/2015 30.10* >60.00 mL/min Final  . Cholesterol 10/08/2015 147  0 - 200 mg/dL Final   ATP III Classification       Desirable:  < 200 mg/dL               Borderline High:  200 - 239 mg/dL          High:  > = 240 mg/dL  . Triglycerides 10/08/2015 117.0  0.0 - 149.0 mg/dL Final   Normal:  <150 mg/dLBorderline High:  150 - 199 mg/dL  . HDL 10/08/2015 44.70   >39.00 mg/dL Final  . VLDL 10/08/2015 23.4  0.0 - 40.0 mg/dL Final  . LDL Cholesterol 10/08/2015 79  0 - 99 mg/dL Final  . Total CHOL/HDL Ratio 10/08/2015 3   Final                  Men          Women1/2 Average Risk     3.4          3.3Average Risk          5.0          4.42X Average Risk          9.6          7.13X Average Risk          15.0          11.0                      . NonHDL 10/08/2015 102.45   Final   NOTE:  Non-HDL goal should be 30 mg/dL higher than patient's LDL goal (i.e. LDL goal of < 70 mg/dL, would have non-HDL goal of < 100 mg/dL)      Medication List       This list is accurate as of: 02/25/16  9:34 AM.  Always use your most recent med list.               amLODipine 5 MG tablet  Commonly known as:  NORVASC  Take 1 tablet (5 mg total) by mouth daily.     aspirin 81 MG tablet  Take 81 mg by mouth at bedtime.     B-D ULTRAFINE III SHORT PEN 31G X 8 MM Misc  Generic drug:  Insulin Pen Needle     B-D ULTRAFINE III SHORT PEN 31G X 8 MM Misc  Generic drug:  Insulin Pen Needle  USE AS DIRECTED TWO TIMES PER DAY     carvedilol 6.25 MG tablet  Commonly known as:  COREG  Take 6.25 mg by mouth 2 (two) times daily with a meal.     fenofibrate 160 MG tablet  TAKE 1 TABLET BY MOUTH EVERY DAY     Fish Oil 1000 MG Caps  Take 1,000 mg by mouth 2 (two) times daily.     glucose blood test strip  Commonly known as:  ONE TOUCH ULTRA TEST  Use to test blood sugar once daily as instructed. Dx code: E11.65     guanFACINE 2 MG tablet  Commonly known as:  TENEX  Take 2 mg by mouth at bedtime.     hydrochlorothiazide 12.5 MG capsule  Commonly known as:  MICROZIDE  TAKE ONE CAPSULE BY MOUTH DAILY     magnesium oxide 400 MG tablet  Commonly known as:  MAG-OX  Take 400 mg by mouth daily.     meclizine 12.5 MG tablet  Commonly known as:  ANTIVERT     multivitamin with minerals Tabs tablet  Take 1 tablet by mouth daily.     NEXIUM 40 MG capsule  Generic drug:   esomeprazole  Take 40 mg by mouth daily.     pioglitazone 15 MG tablet  Commonly known as:  ACTOS  TAKE 1 TABLET (15 MG TOTAL) BY MOUTH DAILY.     pravastatin 80 MG tablet  Commonly known as:  PRAVACHOL  Take 80 mg by mouth daily.     raloxifene 60 MG tablet  Commonly known as:  EVISTA  TAKE 1 TABLET BY MOUTH EVERY DAY     ramipril 10 MG capsule  Commonly known as:  ALTACE  Take 10 mg by mouth daily.     ranitidine 150 MG tablet  Commonly known as:  ZANTAC     repaglinide 1 MG tablet  Commonly known as:  PRANDIN  Take 1 tablet at breakfast and lunch and 2-4 tablets at supper depending on meal size.     traZODone 50 MG tablet  Commonly known as:  DESYREL     VICTOZA 18 MG/3ML Sopn  Generic drug:  Liraglutide  INJECT 1.2 MG INTO THE SKIN DAILY. INJECTS MG PLUS 5 CLICKS     vitamin 0000000 1000 MCG tablet  Commonly known as:  CYANOCOBALAMIN  Take 1,000 mcg by mouth daily.     Vitamin D3 3000 units Tabs  Take 1 capsule by mouth at bedtime.        Allergies:  Allergies  Allergen Reactions  . Codeine Nausea And Vomiting    Past Medical History  Diagnosis Date  . Diabetes mellitus without complication ([place])   . Hypertension     Past Surgical History  Procedure Laterality Date  . Appendectomy      Family History  Problem Relation Age of Onset  . Heart disease Father     Social History:  reports that she has never smoked. She has never used smokeless tobacco. She reports that she does not drink alcohol. Her drug history is not on file.  Review of Systems:    Osteopenia: No record of recent bone density available  HYPERTENSION:  she has had long-standing hypertension treated with multiple drugs. Followed by cardiologist  Renal dysfunction: She has had abnormal renal function but relatively stable for several years, has seen nephrologist annually   Lab Results  Component Value Date   CREATININE 1.71* 10/08/2015    HYPERLIPIDEMIA: The lipid  abnormality consists of elevated LDL and high triglycerides, on fenofibrate 160 mg and pravastatin with good control.  Lab Results  Component Value Date   CHOL 147 10/08/2015   HDL 44.70 10/08/2015   LDLCALC 79 10/08/2015   TRIG 117.0 10/08/2015   CHOLHDL 3 10/08/2015     Examination:   BP 148/62 mmHg  Pulse 87  Temp(Src) 97.8 F (36.6 C)  Resp 14  Ht 5\' 5"  (1.651 m)  Wt 160 lb 6.4 oz (72.757 kg)  BMI 26.69 kg/m2  SpO2 96%  Body mass index is 26.69 kg/(m^2).     ASSESSMENT/ PLAN:   Diabetes type 2  See history of present illness for detailed discussion of his current management, blood sugar patterns and problems identified The patient's diabetes control is fairly good considering her age and duration of diabetes A1c is still under 7 However she has significantly high postprandial readings after lunch and supper now Recently has high readings partly related to respiratory illness, some use of prednisone and also likely to be from her not being able to do her usual walking  For now will get her Prandin dose increased to 2 mg at breakfast and lunch and up to 4 mg at supper based on meal size She will call if blood sugars are not improved  RENAL dysfunction: Apparently related to multiple factors including glomerulosclerosis Her last creatinine was 1.7 and will check again today  Patient Instructions  Take 2mg  at Bfst and and lunch and 2-4 mg at supper of Prandin  Check blood sugars on waking up 2  times a week Also check blood sugars about 2 hours after a meal and do this after different meals by rotation  Recommended blood sugar levels on waking up is 90-130 and about 2 hours after meal is 130-160  Please bring your blood sugar monitor to each visit, thank you      [facility] 02/25/2016, 9:34 AM    Addendum: Lab show glucose 228 and A1c 7.2 Stable renal function  Office Visit on 02/25/2016  Component Date Value Ref Range Status  . Sodium 02/25/2016 136  135  - 145 mEq/L Final  . Potassium 02/25/2016 4.1  3.5 - 5.1 mEq/L Final  . Chloride 02/25/2016 100  96 - 112 mEq/L Final  . CO2 02/25/2016 32  19 - 32 mEq/L Final  . Glucose, Bld 02/25/2016 228* 70 - 99 mg/dL Final  . BUN 02/25/2016 24* 6 - 23 mg/dL Final  . Creatinine, Ser 02/25/2016 1.59* 0.40 - 1.20 mg/dL Final  . Calcium 02/25/2016 9.9  8.4 - 10.5 mg/dL Final  . GFR 02/25/2016 32.70* >60.00 mL/min Final  . Microalb, Ur 02/25/2016 6.8* 0.0 - 1.9 mg/dL Final  . Creatinine,U 02/25/2016 225.2   Final  . Microalb Creat Ratio 02/25/2016 3.0  0.0 - 30.0 mg/g Final  . Hemoglobin A1C 02/25/2016 7.2   Final

## 2016-02-25 NOTE — Patient Instructions (Signed)
Take 2mg  at Bfst and and lunch and 2-4 mg at supper of Prandin  Check blood sugars on waking up 2  times a week Also check blood sugars about 2 hours after a meal and do this after different meals by rotation  Recommended blood sugar levels on waking up is 90-130 and about 2 hours after meal is 130-160  Please bring your blood sugar monitor to each visit, thank you

## 2016-03-01 NOTE — Progress Notes (Signed)
Quick Note:  Please let patient know that the kidney test is slightly better, fax to the PCP and her kidney specialist ______

## 2016-03-02 ENCOUNTER — Other Ambulatory Visit: Payer: Self-pay | Admitting: Endocrinology

## 2016-03-08 DIAGNOSIS — N2581 Secondary hyperparathyroidism of renal origin: Secondary | ICD-10-CM | POA: Diagnosis not present

## 2016-03-08 DIAGNOSIS — D631 Anemia in chronic kidney disease: Secondary | ICD-10-CM | POA: Diagnosis not present

## 2016-03-08 DIAGNOSIS — N183 Chronic kidney disease, stage 3 (moderate): Secondary | ICD-10-CM | POA: Diagnosis not present

## 2016-03-08 DIAGNOSIS — N189 Chronic kidney disease, unspecified: Secondary | ICD-10-CM | POA: Diagnosis not present

## 2016-03-27 ENCOUNTER — Ambulatory Visit: Payer: Medicare Other | Admitting: Endocrinology

## 2016-03-27 DIAGNOSIS — H40053 Ocular hypertension, bilateral: Secondary | ICD-10-CM | POA: Diagnosis not present

## 2016-04-02 ENCOUNTER — Other Ambulatory Visit: Payer: Self-pay | Admitting: Endocrinology

## 2016-04-14 ENCOUNTER — Ambulatory Visit: Payer: Medicare Other | Admitting: Endocrinology

## 2016-04-14 ENCOUNTER — Other Ambulatory Visit: Payer: Self-pay | Admitting: Endocrinology

## 2016-04-19 DIAGNOSIS — R1012 Left upper quadrant pain: Secondary | ICD-10-CM | POA: Diagnosis not present

## 2016-04-19 DIAGNOSIS — K5730 Diverticulosis of large intestine without perforation or abscess without bleeding: Secondary | ICD-10-CM | POA: Diagnosis not present

## 2016-04-24 ENCOUNTER — Other Ambulatory Visit: Payer: Self-pay | Admitting: Endocrinology

## 2016-04-27 ENCOUNTER — Encounter: Payer: Self-pay | Admitting: Endocrinology

## 2016-04-27 ENCOUNTER — Ambulatory Visit (INDEPENDENT_AMBULATORY_CARE_PROVIDER_SITE_OTHER): Payer: Medicare Other | Admitting: Endocrinology

## 2016-04-27 VITALS — BP 122/60 | HR 61 | Temp 98.1°F | Resp 14 | Ht 65.0 in | Wt 160.6 lb

## 2016-04-27 DIAGNOSIS — E1165 Type 2 diabetes mellitus with hyperglycemia: Secondary | ICD-10-CM | POA: Diagnosis not present

## 2016-04-27 LAB — LIPID PANEL
CHOL/HDL RATIO: 3
Cholesterol: 118 mg/dL (ref 0–200)
HDL: 42.8 mg/dL (ref >39.00)
LDL CALC: 53 mg/dL (ref 0–99)
NONHDL: 75.45
Triglycerides: 110 mg/dL (ref 0.0–149.0)
VLDL: 22 mg/dL (ref 0.0–40.0)

## 2016-04-27 LAB — BASIC METABOLIC PANEL
BUN: 27 mg/dL — AB (ref 6–23)
CHLORIDE: 102 meq/L (ref 96–112)
CO2: 29 meq/L (ref 19–32)
Calcium: 9.7 mg/dL (ref 8.4–10.5)
Creatinine, Ser: 1.77 mg/dL — ABNORMAL HIGH (ref 0.40–1.20)
GFR: 28.88 mL/min — AB (ref >60.00)
GLUCOSE: 174 mg/dL — AB (ref 70–99)
POTASSIUM: 4 meq/L (ref 3.5–5.1)
SODIUM: 138 meq/L (ref 135–145)

## 2016-04-27 NOTE — Patient Instructions (Addendum)
Check blood sugars on waking up   times a week Also check blood sugars about 2 hours after a meal and do this after different meals by rotation  Recommended blood sugar levels on waking up is 90-130 and about 2 hours after meal is 130-160  Please bring your blood sugar monitor to each visit, thank you  Take 2 Prandin at dinner for larger meals or more carbs

## 2016-04-27 NOTE — Progress Notes (Signed)
Patient ID: Vanessa Craig, female   DOB: July 29, 1928, 82 y.o.   MRN: [id number]   Reason for Appointment: Diabetes follow-up   History of Present Illness   Diagnosis: Type 2 DIABETES MELITUS, date of diagnosis: 1995        She has had mild and usually well-controlled diabetes for several years Previously on metformin and this was stopped because of renal dysfunction Also has been tried on low dose basal insulin previously However with using GLP-1 drugs like Byetta and Victoza her blood sugars have been fairly well controlled She has been on Victoza since 2011   Recent history:  Non-insulin hypoglycemic drugs:   Actos 15 mg in a.m.Marland Kitchen  Prandin 2 mg before meals.  Victoza 0.9 mg daily      Because of variability in her blood sugars and some significantly high postprandial readings she was changed from Amaryl to Prandin 2 mg before meals in 09/2014. Her A1c was relatively higher at 7.2 in March and generally increasing Her Prandin was increased to 2 mg and she was told to take 4 mg at dinner but she is not increasing the dose at suppertime    Current management, blood sugar patterns and problems identified:  She is checking her blood sugars a few times in the mornings and bedtime but not other times as directed  She has not been able to be as active consistently  She does have a few high readings after supper but not as consistently as the last time  Fasting blood sugars are mildly increased overall  Hypoglycemia: None  Side effects from medications: None  Monitors blood glucose:  less than once a day.    Glucometer: One Touch       Blood Glucose readings from meter download:   Mean values apply above for all meters except median for One Touch  PRE-MEAL Fasting Lunch Dinner Bedtime Overall  Glucose range: 129-163   154  144-231    Mean/median: 149    157  157    Meals: 3 meals per day. Dinner 7pm  Special K Cereal with cheese in am for breakfast or eggs,  variable carbohydrate intake at other meals        Physical activity: exercise: Walking 45 min 0-5 days a week in the mornings   Dietician visit: Most recent: Years ago          Wt Readings from Last 3 Encounters:  04/27/16 160 lb 9.6 oz (72.848 kg)  02/25/16 160 lb 6.4 oz (72.757 kg)  10/08/15 161 lb 6.4 oz (73.211 kg)    Lab Results  Component Value Date   HGBA1C 7.2 02/25/2016   HGBA1C 6.9 10/08/2015   HGBA1C 6.6* 06/07/2015   Lab Results  Component Value Date   MICROALBUR 6.8* 02/25/2016   LDLCALC 79 10/08/2015   CREATININE 1.59* 02/25/2016      No visits with results within 1 Week(s) from this visit. Latest known visit with results is:  Office Visit on 02/25/2016  Component Date Value Ref Range Status  . Sodium 02/25/2016 136  135 - 145 mEq/L Final  . Potassium 02/25/2016 4.1  3.5 - 5.1 mEq/L Final  . Chloride 02/25/2016 100  96 - 112 mEq/L Final  . CO2 02/25/2016 32  19 - 32 mEq/L Final  . Glucose, Bld 02/25/2016 228* 70 - 99 mg/dL Final  . BUN 02/25/2016 24* 6 - 23 mg/dL Final  . Creatinine, Ser 02/25/2016 1.59* 0.40 - 1.20 mg/dL Final  .  Calcium 02/25/2016 9.9  8.4 - 10.5 mg/dL Final  . GFR 02/25/2016 32.70* >60.00 mL/min Final  . Microalb, Ur 02/25/2016 6.8* 0.0 - 1.9 mg/dL Final  . Creatinine,U 02/25/2016 225.2   Final  . Microalb Creat Ratio 02/25/2016 3.0  0.0 - 30.0 mg/g Final  . Hemoglobin A1C 02/25/2016 7.2   Final      Medication List       This list is accurate as of: 04/27/16 10:29 AM.  Always use your most recent med list.               amLODipine 5 MG tablet  Commonly known as:  NORVASC  TAKE 1 TABLET (5 MG TOTAL) BY MOUTH DAILY.     aspirin 81 MG tablet  Take 81 mg by mouth at bedtime.     B-D ULTRAFINE III SHORT PEN 31G X 8 MM Misc  Generic drug:  Insulin Pen Needle     carvedilol 6.25 MG tablet  Commonly known as:  COREG  Take 6.25 mg by mouth 2 (two) times daily with a meal.     fenofibrate 160 MG tablet  TAKE 1 TABLET BY  MOUTH EVERY DAY     Fish Oil 1000 MG Caps  Take 1,000 mg by mouth 2 (two) times daily.     glucose blood test strip  Commonly known as:  ONE TOUCH ULTRA TEST  Use to test blood sugar once daily as instructed. Dx code: E11.65     guanFACINE 2 MG tablet  Commonly known as:  TENEX  Take 2 mg by mouth at bedtime.     hydrochlorothiazide 12.5 MG capsule  Commonly known as:  MICROZIDE  TAKE ONE CAPSULE BY MOUTH DAILY     magnesium oxide 400 MG tablet  Commonly known as:  MAG-OX  Take 400 mg by mouth daily.     meclizine 12.5 MG tablet  Commonly known as:  ANTIVERT     multivitamin with minerals Tabs tablet  Take 1 tablet by mouth daily.     NEXIUM 40 MG capsule  Generic drug:  esomeprazole  Take 40 mg by mouth daily.     pioglitazone 15 MG tablet  Commonly known as:  ACTOS  TAKE 1 TABLET (15 MG TOTAL) BY MOUTH DAILY.     pravastatin 80 MG tablet  Commonly known as:  PRAVACHOL  Take 80 mg by mouth daily.     raloxifene 60 MG tablet  Commonly known as:  EVISTA  TAKE 1 TABLET BY MOUTH EVERY DAY     ramipril 10 MG capsule  Commonly known as:  ALTACE  Take 10 mg by mouth daily.     ranitidine 150 MG tablet  Commonly known as:  ZANTAC     repaglinide 2 MG tablet  Commonly known as:  PRANDIN  Take 1 tablet at breakfast and lunch and 1-2 tablets at supper depending on meal size.     traZODone 50 MG tablet  Commonly known as:  DESYREL     VICTOZA 18 MG/3ML Sopn  Generic drug:  Liraglutide  INJECT 1.2 MG INTO THE SKIN DAILY. INJECTS MG PLUS 5 CLICKS     vitamin 0000000 1000 MCG tablet  Commonly known as:  CYANOCOBALAMIN  Take 1,000 mcg by mouth daily.     Vitamin D3 3000 units Tabs  Take 1 capsule by mouth at bedtime.        Allergies:  Allergies  Allergen Reactions  . Codeine Nausea And Vomiting  Past Medical History  Diagnosis Date  . Diabetes mellitus without complication ([place])   . Hypertension     Past Surgical History  Procedure Laterality Date    . Appendectomy      Family History  Problem Relation Age of Onset  . Heart disease Father     Social History:  reports that she has never smoked. She has never used smokeless tobacco. She reports that she does not drink alcohol. Her drug history is not on file.  Review of Systems:    Osteopenia: No record of recent bone density available, appears to be on Evista possibly from her PCP  HYPERTENSION:  she has had long-standing hypertension treated with multiple drugs. Followed by cardiologist  Renal dysfunction: She has had abnormal renal function but relatively stable for several years, has been dismissed by nephrologist   Lab Results  Component Value Date   CREATININE 1.59* 02/25/2016    HYPERLIPIDEMIA: The lipid abnormality consists of elevated LDL and high triglycerides, on fenofibrate 160 mg and pravastatin with good control.  Lab Results  Component Value Date   CHOL 147 10/08/2015   HDL 44.70 10/08/2015   LDLCALC 79 10/08/2015   TRIG 117.0 10/08/2015   CHOLHDL 3 10/08/2015     Examination:   BP 122/60 mmHg  Pulse 61  Temp(Src) 98.1 F (36.7 C)  Resp 14  Ht 5\' 5"  (1.651 m)  Wt 160 lb 9.6 oz (72.848 kg)  BMI 26.73 kg/m2  SpO2 97%  Body mass index is 26.73 kg/(m^2).     ASSESSMENT/ PLAN:   Diabetes type 2  See history of present illness for detailed discussion of his current management, blood sugar patterns and problems identified The patient's diabetes control is fairly good considering her age and duration of diabetes She does tend to have some high postprandial readings at times after supper but is not monitoring much after breakfast and lunch  Will check her fructosamine to evaluate control She can take 4 mg of Prandin at dinnertime when she is eating larger meals are more carbohydrates otherwise continue 2 mg More frequent glucose monitoring at various times when she is able to use her right hand better  She will discuss continuation of Evista with  PCP  Patient Instructions  Check blood sugars on waking up   times a week Also check blood sugars about 2 hours after a meal and do this after different meals by rotation  Recommended blood sugar levels on waking up is 90-130 and about 2 hours after meal is 130-160  Please bring your blood sugar monitor to each visit, thank you  Take 2 Prandin at dinner for larger meals or more carbs     Elior Robinette 04/27/2016, 10:29 AM

## 2016-04-27 NOTE — Addendum Note (Signed)
Addended by: Kaylyn Lim I on: 04/27/2016 10:44 AM   Modules accepted: Orders

## 2016-05-01 NOTE — Progress Notes (Signed)
Quick Note:  Please let patient know that the cholesterol is excellent, kidney tests slightly worse but similar to 6 months ago, have been sent to her MDs ______

## 2016-05-17 ENCOUNTER — Ambulatory Visit: Payer: Medicare Other | Admitting: Endocrinology

## 2016-05-18 ENCOUNTER — Other Ambulatory Visit: Payer: Self-pay | Admitting: Endocrinology

## 2016-06-07 LAB — FRUCTOSAMINE: FRUCTOSAMINE: 296 umol/L — AB (ref 0–285)

## 2016-06-08 ENCOUNTER — Other Ambulatory Visit: Payer: Self-pay | Admitting: Endocrinology

## 2016-06-08 ENCOUNTER — Telehealth: Payer: Self-pay | Admitting: Endocrinology

## 2016-06-08 MED ORDER — GLUCOSE BLOOD VI STRP: ORAL_STRIP | Status: DC

## 2016-06-08 NOTE — Telephone Encounter (Signed)
Test strips called in to [facility] in [place].

## 2016-06-08 NOTE — Telephone Encounter (Signed)
Rx submitted per pt's request.  

## 2016-06-22 DIAGNOSIS — I10 Essential (primary) hypertension: Secondary | ICD-10-CM | POA: Diagnosis not present

## 2016-06-22 DIAGNOSIS — Z79899 Other long term (current) drug therapy: Secondary | ICD-10-CM | POA: Diagnosis not present

## 2016-06-22 DIAGNOSIS — K219 Gastro-esophageal reflux disease without esophagitis: Secondary | ICD-10-CM | POA: Diagnosis not present

## 2016-06-22 DIAGNOSIS — G4700 Insomnia, unspecified: Secondary | ICD-10-CM | POA: Diagnosis not present

## 2016-06-22 DIAGNOSIS — E119 Type 2 diabetes mellitus without complications: Secondary | ICD-10-CM | POA: Diagnosis not present

## 2016-06-22 DIAGNOSIS — E785 Hyperlipidemia, unspecified: Secondary | ICD-10-CM | POA: Diagnosis not present

## 2016-07-16 ENCOUNTER — Other Ambulatory Visit: Payer: Self-pay | Admitting: Endocrinology

## 2016-07-27 ENCOUNTER — Other Ambulatory Visit: Payer: Self-pay

## 2016-07-31 ENCOUNTER — Other Ambulatory Visit: Payer: Self-pay | Admitting: Endocrinology

## 2016-08-03 DIAGNOSIS — I10 Essential (primary) hypertension: Secondary | ICD-10-CM | POA: Diagnosis not present

## 2016-08-03 DIAGNOSIS — E785 Hyperlipidemia, unspecified: Secondary | ICD-10-CM | POA: Diagnosis not present

## 2016-08-03 DIAGNOSIS — G4700 Insomnia, unspecified: Secondary | ICD-10-CM | POA: Diagnosis not present

## 2016-08-03 DIAGNOSIS — K219 Gastro-esophageal reflux disease without esophagitis: Secondary | ICD-10-CM | POA: Diagnosis not present

## 2016-08-03 DIAGNOSIS — E119 Type 2 diabetes mellitus without complications: Secondary | ICD-10-CM | POA: Diagnosis not present

## 2016-08-07 ENCOUNTER — Other Ambulatory Visit: Payer: Self-pay | Admitting: Endocrinology

## 2016-08-24 DIAGNOSIS — I10 Essential (primary) hypertension: Secondary | ICD-10-CM

## 2016-08-24 DIAGNOSIS — I129 Hypertensive chronic kidney disease with stage 1 through stage 4 chronic kidney disease, or unspecified chronic kidney disease: Secondary | ICD-10-CM

## 2016-08-24 HISTORY — DX: Essential (primary) hypertension: I10

## 2016-08-24 HISTORY — DX: Hypertensive chronic kidney disease with stage 1 through stage 4 chronic kidney disease, or unspecified chronic kidney disease: I12.9

## 2016-08-25 DIAGNOSIS — E119 Type 2 diabetes mellitus without complications: Secondary | ICD-10-CM | POA: Diagnosis not present

## 2016-08-25 DIAGNOSIS — E785 Hyperlipidemia, unspecified: Secondary | ICD-10-CM | POA: Diagnosis not present

## 2016-08-25 DIAGNOSIS — I10 Essential (primary) hypertension: Secondary | ICD-10-CM | POA: Diagnosis not present

## 2016-08-28 ENCOUNTER — Ambulatory Visit (INDEPENDENT_AMBULATORY_CARE_PROVIDER_SITE_OTHER): Payer: Medicare Other | Admitting: Endocrinology

## 2016-08-28 ENCOUNTER — Encounter: Payer: Self-pay | Admitting: Endocrinology

## 2016-08-28 VITALS — BP 132/82 | HR 78 | Wt 159.0 lb

## 2016-08-28 DIAGNOSIS — E782 Mixed hyperlipidemia: Secondary | ICD-10-CM | POA: Diagnosis not present

## 2016-08-28 DIAGNOSIS — E1165 Type 2 diabetes mellitus with hyperglycemia: Secondary | ICD-10-CM

## 2016-08-28 DIAGNOSIS — J00 Acute nasopharyngitis [common cold]: Secondary | ICD-10-CM

## 2016-08-28 LAB — CBC WITH DIFFERENTIAL/PLATELET
Basophils Absolute: 0 10*3/uL (ref 0.0–0.1)
Basophils Relative: 0.3 % (ref 0.0–3.0)
Eosinophils Absolute: 0.1 10*3/uL (ref 0.0–0.7)
Eosinophils Relative: 1.2 % (ref 0.0–5.0)
HEMATOCRIT: 39.8 % (ref 36.0–46.0)
HEMOGLOBIN: 13.7 g/dL (ref 12.0–15.0)
LYMPHS PCT: 20.5 % (ref 12.0–46.0)
Lymphs Abs: 2.1 10*3/uL (ref 0.7–4.0)
MCHC: 34.5 g/dL (ref 30.0–36.0)
MCV: 90.6 fl (ref 78.0–100.0)
MONOS PCT: 6.4 % (ref 3.0–12.0)
Monocytes Absolute: 0.7 10*3/uL (ref 0.1–1.0)
NEUTROS ABS: 7.4 10*3/uL (ref 1.4–7.7)
Neutrophils Relative %: 71.6 % (ref 43.0–77.0)
Platelets: 252 10*3/uL (ref 150.0–400.0)
RBC: 4.4 Mil/uL (ref 3.87–5.11)
RDW: 12.6 % (ref 11.5–15.5)
WBC: 10.4 10*3/uL (ref 4.0–10.5)

## 2016-08-28 LAB — COMPREHENSIVE METABOLIC PANEL
ALBUMIN: 4 g/dL (ref 3.5–5.2)
ALT: 12 U/L (ref 0–35)
AST: 17 U/L (ref 0–37)
Alkaline Phosphatase: 49 U/L (ref 39–117)
BILIRUBIN TOTAL: 0.9 mg/dL (ref 0.2–1.2)
BUN: 17 mg/dL (ref 6–23)
CHLORIDE: 103 meq/L (ref 96–112)
CO2: 29 meq/L (ref 19–32)
Calcium: 9.5 mg/dL (ref 8.4–10.5)
Creatinine, Ser: 1.49 mg/dL — ABNORMAL HIGH (ref 0.40–1.20)
GFR: 35.21 mL/min — AB (ref >60.00)
Glucose, Bld: 226 mg/dL — ABNORMAL HIGH (ref 70–99)
Potassium: 4 mEq/L (ref 3.5–5.1)
Sodium: 139 mEq/L (ref 135–145)
Total Protein: 7.1 g/dL (ref 6.0–8.3)

## 2016-08-28 LAB — POCT GLYCOSYLATED HEMOGLOBIN (HGB A1C): HEMOGLOBIN A1C: 6.5

## 2016-08-28 MED ORDER — INSULIN ASPART PROT & ASPART (70-30 MIX) 100 UNIT/ML PEN: 8.0000 [IU] | PEN_INJECTOR | Freq: Every day | SUBCUTANEOUS | 0 refills | Status: DC

## 2016-08-28 NOTE — Progress Notes (Signed)
Patient ID: Vanessa Craig, female   DOB: [birth year], 82 y.o.   MRN: [id number]   Reason for Appointment: Diabetes follow-up   History of Present Illness   Diagnosis: Type 2 DIABETES MELITUS, date of diagnosis: 1995        She has had mild and usually well-controlled diabetes for several years Previously on metformin and this was stopped because of renal dysfunction Also has been tried on low dose basal insulin previously However with using GLP-1 drugs like Byetta and Victoza her blood sugars have been fairly well controlled She has been on Victoza since 2011   Recent history:  Non-insulin hypoglycemic drugs:   Actos 15 mg in a.m.Marland Kitchen  Prandin 1 mg before breakfast and lunch, 2 at supper.  Victoza 0.9 mg daily      Because of variability in her blood sugars and some significantly high postprandial readings she was changed from Amaryl to Prandin 2 mg before meals in 09/2014. Her A1c was relatively higher at 7.2 in March Her Prandin was increased to 2 mg but she is still getting her 1 mg prescription for no apparent reason    Current management, blood sugar patterns and problems identified:  She is checking her blood sugars a few times in the mornings and evenings  She has relatively higher fasting readings this time  Blood sugars are slightly better in the mid day and afternoon but tend to be higher in the evenings especially after supper  She is still eating unbalanced meals for breakfast with serial  She thinks she has had some symptoms of a cold since last evening but no fever and appears that her sugars are higher since last night and have gone up to 342 today  Otherwise fasting blood sugars are usually around 150-170  She does have a few high readings after supper  Hypoglycemia: None  Side effects from medications: None  Monitors blood glucose:  less than once a day.    Glucometer: One Touch  ultra 2      Blood Glucose readings from meter download:  Mean  values apply above for all meters except median for One Touch  PRE-MEAL Fasting Lunch Dinner Bedtime Overall  Glucose range: 143-342  108-178  113-220  147-265    Mean/median: 161     161   Meals: 3 meals per day. Dinner 6-7pm  Special K Cereal with cheese in am for breakfast or eggs, variable carbohydrate intake at other meals         Physical activity: exercise: Walking 45 min 5 days a week in the mornings   Dietician visit: Most recent: Years ago          Wt Readings from Last 3 Encounters:  08/28/16 159 lb (72.1 kg)  04/27/16 160 lb 9.6 oz (72.8 kg)  02/25/16 160 lb 6.4 oz (72.8 kg)    Lab Results  Component Value Date   HGBA1C 6.5 08/28/2016   HGBA1C 7.2 02/25/2016   HGBA1C 6.9 10/08/2015   Lab Results  Component Value Date   MICROALBUR 6.8 (H) 02/25/2016   LDLCALC 53 04/27/2016   CREATININE 1.49 (H) 08/28/2016      Office Visit on 08/28/2016  Component Date Value Ref Range Status  . Sodium 08/28/2016 139  135 - 145 mEq/L Final  . Potassium 08/28/2016 4.0  3.5 - 5.1 mEq/L Final  . Chloride 08/28/2016 103  96 - 112 mEq/L Final  . CO2 08/28/2016 29  19 - 32 mEq/L Final  .  Glucose, Bld 08/28/2016 226* 70 - 99 mg/dL Final  . BUN 08/28/2016 17  6 - 23 mg/dL Final  . Creatinine, Ser 08/28/2016 1.49* 0.40 - 1.20 mg/dL Final  . Total Bilirubin 08/28/2016 0.9  0.2 - 1.2 mg/dL Final  . Alkaline Phosphatase 08/28/2016 49  39 - 117 U/L Final  . AST 08/28/2016 17  0 - 37 U/L Final  . ALT 08/28/2016 12  0 - 35 U/L Final  . Total Protein 08/28/2016 7.1  6.0 - 8.3 g/dL Final  . Albumin 08/28/2016 4.0  3.5 - 5.2 g/dL Final  . Calcium 08/28/2016 9.5  8.4 - 10.5 mg/dL Final  . GFR 08/28/2016 35.21* >60.00 mL/min Final  . WBC 08/28/2016 10.4  4.0 - 10.5 K/uL Final  . RBC 08/28/2016 4.40  3.87 - 5.11 Mil/uL Final  . Hemoglobin 08/28/2016 13.7  12.0 - 15.0 g/dL Final  . HCT 08/28/2016 39.8  36.0 - 46.0 % Final  . MCV 08/28/2016 90.6  78.0 - 100.0 fl Final  . MCHC 08/28/2016  34.5  30.0 - 36.0 g/dL Final  . RDW 08/28/2016 12.6  11.5 - 15.5 % Final  . Platelets 08/28/2016 252.0  150.0 - 400.0 K/uL Final  . Neutrophils Relative % 08/28/2016 71.6  43.0 - 77.0 % Final  . Lymphocytes Relative 08/28/2016 20.5  12.0 - 46.0 % Final  . Monocytes Relative 08/28/2016 6.4  3.0 - 12.0 % Final  . Eosinophils Relative 08/28/2016 1.2  0.0 - 5.0 % Final  . Basophils Relative 08/28/2016 0.3  0.0 - 3.0 % Final  . Neutro Abs 08/28/2016 7.4  1.4 - 7.7 K/uL Final  . Lymphs Abs 08/28/2016 2.1  0.7 - 4.0 K/uL Final  . Monocytes Absolute 08/28/2016 0.7  0.1 - 1.0 K/uL Final  . Eosinophils Absolute 08/28/2016 0.1  0.0 - 0.7 K/uL Final  . Basophils Absolute 08/28/2016 0.0  0.0 - 0.1 K/uL Final  . Hemoglobin A1C 08/28/2016 6.5   Final      Medication List       Accurate as of 08/28/16 12:58 PM. Always use your most recent med list.          amLODipine 5 MG tablet Commonly known as:  NORVASC TAKE 1 TABLET (5 MG TOTAL) BY MOUTH DAILY.   aspirin 81 MG tablet Take 81 mg by mouth at bedtime.   B-D ULTRAFINE III SHORT PEN 31G X 8 MM Misc Generic drug:  Insulin Pen Needle   carvedilol 6.25 MG tablet Commonly known as:  COREG Take 6.25 mg by mouth 2 (two) times daily with a meal.   fenofibrate 160 MG tablet TAKE 1 TABLET BY MOUTH EVERY DAY   Fish Oil 1000 MG Caps Take 1,000 mg by mouth 2 (two) times daily.   glucose blood test strip Commonly known as:  ONE TOUCH ULTRA TEST Use to test blood sugar once daily as instructed. Dx code: E11.65   guanFACINE 2 MG tablet Commonly known as:  TENEX Take 2 mg by mouth at bedtime.   insulin aspart protamine - aspart (70-30) 100 UNIT/ML FlexPen Commonly known as:  NOVOLOG MIX 70/30 FLEXPEN Inject 0.08 mLs (8 Units total) into the skin daily before supper.   magnesium oxide 400 MG tablet Commonly known as:  MAG-OX Take 400 mg by mouth daily.   meclizine 12.5 MG tablet Commonly known as:  ANTIVERT   multivitamin with minerals  Tabs tablet Take 1 tablet by mouth daily.   NEXIUM 40 MG capsule Generic drug:  esomeprazole Take 40 mg by mouth daily.   pioglitazone 15 MG tablet Commonly known as:  ACTOS TAKE 1 TABLET (15 MG TOTAL) BY MOUTH DAILY.   pravastatin 80 MG tablet Commonly known as:  PRAVACHOL Take 80 mg by mouth daily.   raloxifene 60 MG tablet Commonly known as:  EVISTA TAKE 1 TABLET BY MOUTH EVERY DAY   ramipril 10 MG capsule Commonly known as:  ALTACE Take 10 mg by mouth daily.   ranitidine 150 MG tablet Commonly known as:  ZANTAC   repaglinide 2 MG tablet Commonly known as:  PRANDIN Take 1 tablet at breakfast and lunch and 1-2 tablets at supper depending on meal size.   repaglinide 1 MG tablet Commonly known as:  PRANDIN TAKE 1 TABLET AT BREAKFAST AND LUNCH AND 2-4 TABLETS AT SUPPER DEPENDING ON MEAL SIZE.   traZODone 50 MG tablet Commonly known as:  DESYREL   VICTOZA 18 MG/3ML Sopn Generic drug:  Liraglutide INJECT 1.2 MG INTO THE SKIN DAILY. INJECTS MG PLUS 5 CLICKS   vitamin W-46 1000 MCG tablet Commonly known as:  CYANOCOBALAMIN Take 1,000 mcg by mouth daily.   Vitamin D3 3000 units Tabs Take 1 capsule by mouth at bedtime.       Allergies:  Allergies  Allergen Reactions  . Codeine Nausea And Vomiting    Past Medical History:  Diagnosis Date  . Diabetes mellitus without complication ([place])   . Hypertension     Past Surgical History:  Procedure Laterality Date  . APPENDECTOMY      Family History  Problem Relation Age of Onset  . Heart disease Father     Social History:  reports that she has never smoked. She has never used smokeless tobacco. She reports that she does not drink alcohol. Her drug history is not on file.  Review of Systems:    Osteopenia: She appears to be on Evista possibly from her PCP, no records available  HYPERTENSION:  she has had long-standing hypertension treated with multiple drugs. Followed by cardiologist and PCP, her HCTZ has  been stopped  Renal dysfunction: She has had abnormal renal function but relatively stable for several years, the last creatinine from nephrologist was 1.65 She apparently is not going to be seen in follow-up there  Lab Results  Component Value Date   CREATININE 1.49 (H) 08/28/2016    HYPERLIPIDEMIA: The lipid abnormality consists of elevated LDL and high triglycerides Her PCP stopped her on fenofibrate probably for reducing number of medications only Previously triglycerides were controlled with fenofibrate  Lab Results  Component Value Date   CHOL 118 04/27/2016   HDL 42.80 04/27/2016   LDLCALC 53 04/27/2016   TRIG 110.0 04/27/2016   CHOLHDL 3 04/27/2016     Examination:   BP 132/82   Pulse 78   Wt 159 lb (72.1 kg)   BMI 26.46 kg/m   Body mass index is 26.46 kg/m.     ASSESSMENT/ PLAN:   Diabetes type 2  See history of present illness for detailed discussion of  current management, blood sugar patterns and problems identified The patient's diabetes control is fairly good With her A1c below 7 However recently blood sugars are higher and consistently increase in the morning Since last evening her sugar has gone up significantly possibly from her respiratory infection  Since her glucose was 342 today will have her start small dose of NovoLog mix insulin at suppertime starting with 8 units The patient does not appear to remember how  to take insulin that she had taken a few years ago and this was discussed in detail Her insulin dose was demonstrated on the insulin pen and discussed injection sites She will also need to check more consistently in the afternoon and evenings as well as in the mornings more regularly  Her daughter will call the report her blood sugars on Friday and may be able to taper off her insulin For convenience she can try taking just 1 mg Prandin before each meal    Patient Instructions  Check blood sugars on waking up 3x weekly   Also check  blood sugars about 2 hours after a meal and do this after different meals by rotation  Recommended blood sugar levels on waking up is 90-130 and about 2 hours after meal is 130-160  Please bring your blood sugar monitor to each visit, thank you  Restart fenofibrate 3x weekly  Repeglanide 1 pill before each meal  Insulin 8 units before supper  Call sugars in 1 week       Rufino Staup 08/28/2016, 12:58 PM

## 2016-08-28 NOTE — Patient Instructions (Addendum)
Check blood sugars on waking up 3x weekly   Also check blood sugars about 2 hours after a meal and do this after different meals by rotation  Recommended blood sugar levels on waking up is 90-130 and about 2 hours after meal is 130-160  Please bring your blood sugar monitor to each visit, thank you  Restart fenofibrate 3x weekly  Repeglanide 1 pill before each meal  Insulin 8 units before supper  Call sugars in 1 week

## 2016-08-29 NOTE — Progress Notes (Signed)
Please let patient know that the white blood cell count is high normal, if she is still having significant cold symptoms needs to see PCP Kidney test is better Also need to know if her blood sugars are better with insulin?

## 2016-09-28 ENCOUNTER — Other Ambulatory Visit: Payer: Self-pay

## 2016-09-28 ENCOUNTER — Encounter: Payer: Self-pay | Admitting: Endocrinology

## 2016-09-28 ENCOUNTER — Ambulatory Visit (INDEPENDENT_AMBULATORY_CARE_PROVIDER_SITE_OTHER): Payer: Medicare Other | Admitting: Endocrinology

## 2016-09-28 VITALS — BP 140/70 | HR 63 | Wt 164.0 lb

## 2016-09-28 DIAGNOSIS — E1165 Type 2 diabetes mellitus with hyperglycemia: Secondary | ICD-10-CM | POA: Diagnosis not present

## 2016-09-28 DIAGNOSIS — E118 Type 2 diabetes mellitus with unspecified complications: Secondary | ICD-10-CM

## 2016-09-28 LAB — POCT GLUCOSE (DEVICE FOR HOME USE): POC Glucose: 144 mg/dl — AB (ref 70–99)

## 2016-09-28 MED ORDER — GLUCOSE BLOOD VI STRP: ORAL_STRIP | 12 refills | Status: DC

## 2016-09-28 NOTE — Patient Instructions (Addendum)
Check blood sugar at least once a day, alternating early morning or 2-3 hours after any meal by rotation.  Call if blood sugars are consistently over 200  Call readings in 1 week

## 2016-09-28 NOTE — Addendum Note (Signed)
Addended by: Inocencio Homes on: 09/28/2016 01:20 PM   Modules accepted: Orders

## 2016-09-28 NOTE — Progress Notes (Signed)
Patient ID: Vanessa Craig, female   DOB: [birth year], 82 y.o.   MRN: [id number]   Reason for Appointment: Diabetes follow-up   History of Present Illness   Diagnosis: Type 2 DIABETES MELITUS, date of diagnosis: 1995        She has had mild and usually well-controlled diabetes for several years Previously on metformin and this was stopped because of renal dysfunction Also has been tried on low dose basal insulin previously However with using GLP-1 drugs like Byetta and Victoza her blood sugars have been fairly well controlled She has been on Victoza since 2011   Recent history:  Non-insulin hypoglycemic drugs:   Actos 15 mg in a.m.Marland Kitchen  Prandin 1 mg before breakfast and lunch, 2 at supper.  Victoza 0.9 mg daily     INSULIN regimen: NovoLog 70/30, 8 units at suppertime  Because of significantly high readings including over 300 and only October she was started on low-dose premixed insulin at suppertime    Current management, blood sugar patterns and problems identified:  She is checking her blood sugars mostly in the morning but unable to get any records except for a few days before 10/13  She is still using her old ultra monitor but unable to download this as her battery was not replaced properly  She has difficulty remembering her blood sugars and does not keep a record  Previously was having some high readings at various times and even in the first week of October was as high as 366 3  She thinks her blood sugars in the mornings are now 140-150 and glucose is 148 after breakfast today, did have a light breakfast  Her daughter says that she is really not getting hungry recently  Hypoglycemia: None  Side effects from medications: None  Monitors blood glucose:  less than once a day.    Glucometer: One Touch  ultra 2/ Verio      Blood Glucose readings from meter as above:  Meals: 3 meals per day. Dinner 6-7pm  Special K Cereal or raisin bran with cheese in am for  breakfast or eggs, variable carbohydrate intake at other meals         Physical activity: exercise: Walking 45 min 5 days a week in the mornings   Dietician visit: Most recent: Years ago          Wt Readings from Last 3 Encounters:  09/28/16 164 lb (74.4 kg)  08/28/16 159 lb (72.1 kg)  04/27/16 160 lb 9.6 oz (72.8 kg)    Lab Results  Component Value Date   HGBA1C 6.5 08/28/2016   HGBA1C 7.2 02/25/2016   HGBA1C 6.9 10/08/2015   Lab Results  Component Value Date   MICROALBUR 6.8 (H) 02/25/2016   LDLCALC 53 04/27/2016   CREATININE 1.49 (H) 08/28/2016      No visits with results within 1 Week(s) from this visit.  Latest known visit with results is:  Office Visit on 08/28/2016  Component Date Value Ref Range Status  . Sodium 08/28/2016 139  135 - 145 mEq/L Final  . Potassium 08/28/2016 4.0  3.5 - 5.1 mEq/L Final  . Chloride 08/28/2016 103  96 - 112 mEq/L Final  . CO2 08/28/2016 29  19 - 32 mEq/L Final  . Glucose, Bld 08/28/2016 226* 70 - 99 mg/dL Final  . BUN 08/28/2016 17  6 - 23 mg/dL Final  . Creatinine, Ser 08/28/2016 1.49* 0.40 - 1.20 mg/dL Final  . Total Bilirubin 08/28/2016 0.9  0.2 - 1.2 mg/dL Final  . Alkaline Phosphatase 08/28/2016 49  39 - 117 U/L Final  . AST 08/28/2016 17  0 - 37 U/L Final  . ALT 08/28/2016 12  0 - 35 U/L Final  . Total Protein 08/28/2016 7.1  6.0 - 8.3 g/dL Final  . Albumin 08/28/2016 4.0  3.5 - 5.2 g/dL Final  . Calcium 08/28/2016 9.5  8.4 - 10.5 mg/dL Final  . GFR 08/28/2016 35.21* >60.00 mL/min Final  . WBC 08/28/2016 10.4  4.0 - 10.5 K/uL Final  . RBC 08/28/2016 4.40  3.87 - 5.11 Mil/uL Final  . Hemoglobin 08/28/2016 13.7  12.0 - 15.0 g/dL Final  . HCT 08/28/2016 39.8  36.0 - 46.0 % Final  . MCV 08/28/2016 90.6  78.0 - 100.0 fl Final  . MCHC 08/28/2016 34.5  30.0 - 36.0 g/dL Final  . RDW 08/28/2016 12.6  11.5 - 15.5 % Final  . Platelets 08/28/2016 252.0  150.0 - 400.0 K/uL Final  . Neutrophils Relative % 08/28/2016 71.6  43.0 - 77.0  % Final  . Lymphocytes Relative 08/28/2016 20.5  12.0 - 46.0 % Final  . Monocytes Relative 08/28/2016 6.4  3.0 - 12.0 % Final  . Eosinophils Relative 08/28/2016 1.2  0.0 - 5.0 % Final  . Basophils Relative 08/28/2016 0.3  0.0 - 3.0 % Final  . Neutro Abs 08/28/2016 7.4  1.4 - 7.7 K/uL Final  . Lymphs Abs 08/28/2016 2.1  0.7 - 4.0 K/uL Final  . Monocytes Absolute 08/28/2016 0.7  0.1 - 1.0 K/uL Final  . Eosinophils Absolute 08/28/2016 0.1  0.0 - 0.7 K/uL Final  . Basophils Absolute 08/28/2016 0.0  0.0 - 0.1 K/uL Final  . Hemoglobin A1C 08/28/2016 6.5   Final      Medication List       Accurate as of 09/28/16 11:09 AM. Always use your most recent med list.          amLODipine 5 MG tablet Commonly known as:  NORVASC TAKE 1 TABLET (5 MG TOTAL) BY MOUTH DAILY.   aspirin 81 MG tablet Take 81 mg by mouth at bedtime.   B-D ULTRAFINE III SHORT PEN 31G X 8 MM Misc Generic drug:  Insulin Pen Needle   carvedilol 6.25 MG tablet Commonly known as:  COREG Take 6.25 mg by mouth 2 (two) times daily with a meal.   fenofibrate 160 MG tablet TAKE 1 TABLET BY MOUTH EVERY DAY   Fish Oil 1000 MG Caps Take 1,000 mg by mouth 2 (two) times daily.   glucose blood test strip Commonly known as:  ONE TOUCH ULTRA TEST Use to test blood sugar once daily as instructed. Dx code: E11.65   guanFACINE 2 MG tablet Commonly known as:  TENEX Take 2 mg by mouth at bedtime.   insulin aspart protamine - aspart (70-30) 100 UNIT/ML FlexPen Commonly known as:  NOVOLOG MIX 70/30 FLEXPEN Inject 0.08 mLs (8 Units total) into the skin daily before supper.   magnesium oxide 400 MG tablet Commonly known as:  MAG-OX Take 400 mg by mouth daily.   meclizine 12.5 MG tablet Commonly known as:  ANTIVERT   multivitamin with minerals Tabs tablet Take 1 tablet by mouth daily.   NEXIUM 40 MG capsule Generic drug:  esomeprazole Take 40 mg by mouth daily.   pioglitazone 15 MG tablet Commonly known as:  ACTOS TAKE  1 TABLET (15 MG TOTAL) BY MOUTH DAILY.   pravastatin 80 MG tablet Commonly known as:  PRAVACHOL Take 80 mg by mouth daily.   raloxifene 60 MG tablet Commonly known as:  EVISTA TAKE 1 TABLET BY MOUTH EVERY DAY   ramipril 10 MG capsule Commonly known as:  ALTACE Take 10 mg by mouth daily.   ranitidine 150 MG tablet Commonly known as:  ZANTAC   repaglinide 2 MG tablet Commonly known as:  PRANDIN Take 1 tablet at breakfast and lunch and 1-2 tablets at supper depending on meal size.   repaglinide 1 MG tablet Commonly known as:  PRANDIN TAKE 1 TABLET AT BREAKFAST AND LUNCH AND 2-4 TABLETS AT SUPPER DEPENDING ON MEAL SIZE.   traZODone 50 MG tablet Commonly known as:  DESYREL   VICTOZA 18 MG/3ML Sopn Generic drug:  liraglutide INJECT 1.2 MG INTO THE SKIN DAILY. INJECTS MG PLUS 5 CLICKS   vitamin H-41 1000 MCG tablet Commonly known as:  CYANOCOBALAMIN Take 1,000 mcg by mouth daily.   Vitamin D3 3000 units Tabs Take 1 capsule by mouth at bedtime.       Allergies:  Allergies  Allergen Reactions  . Codeine Nausea And Vomiting    Past Medical History:  Diagnosis Date  . Diabetes mellitus without complication ([place])   . Hypertension     Past Surgical History:  Procedure Laterality Date  . APPENDECTOMY      Family History  Problem Relation Age of Onset  . Heart disease Father     Social History:  reports that she has never smoked. She has never used smokeless tobacco. She reports that she does not drink alcohol. Her drug history is not on file.  Review of Systems:  Following is a copy of the previous note:   Osteopenia: She appears to be on Evista possibly from her PCP, no records available  HYPERTENSION:  she has had long-standing hypertension treated with multiple drugs. Followed by cardiologist and PCP, her HCTZ has been stopped  Renal dysfunction: She has had abnormal renal function but relatively stable for several years, the last creatinine from  nephrologist was 1.65 She apparently is not going to be seen in follow-up there  Lab Results  Component Value Date   CREATININE 1.49 (H) 08/28/2016    HYPERLIPIDEMIA: The lipid abnormality consists of elevated LDL and high triglycerides Her PCP stopped her on fenofibrate probably for reducing number of medications only Previously triglycerides were controlled with fenofibrate  Lab Results  Component Value Date   CHOL 118 04/27/2016   HDL 42.80 04/27/2016   LDLCALC 53 04/27/2016   TRIG 110.0 04/27/2016   CHOLHDL 3 04/27/2016     Examination:   BP 140/70   Pulse 63   Wt 164 lb (74.4 kg)   SpO2 94%   BMI 27.29 kg/m   Body mass index is 27.29 kg/m.     ASSESSMENT/ PLAN:   Diabetes type 2  See history of present illness for detailed discussion of  current management, blood sugar patterns and problems identified The patient's diabetes control isImproving with adding low-dose insulin at suppertime However difficult to assess her home readings as her glucose monitor was not functioning today and she does not have a record Glucose is fairly good in the office today, previously had significant hyperglycemia  Her daughter will call the report her blood sugars in about a week to adjust her medications For now will continue her multiple oral medications also so that she does not have to take insulin more than once daily    Patient Instructions  Check blood sugar  at least once a day, alternating early morning or 2-3 hours after any meal by rotation.  Call if blood sugars are consistently over 200  Call readings in 1 week    Chene Kasinger 09/28/2016, 11:09 AM

## 2016-10-03 ENCOUNTER — Telehealth: Payer: Self-pay | Admitting: Endocrinology

## 2016-10-03 NOTE — Telephone Encounter (Signed)
Left voicemail for patient to call back. 

## 2016-10-03 NOTE — Telephone Encounter (Signed)
Daughter Santiago Glad called, she said that the meter that Pt was given doesn't seem to be working properly.  She wants to know if she can bring it back for a new one.

## 2016-10-13 ENCOUNTER — Other Ambulatory Visit: Payer: Self-pay | Admitting: Endocrinology

## 2016-10-27 ENCOUNTER — Ambulatory Visit (INDEPENDENT_AMBULATORY_CARE_PROVIDER_SITE_OTHER): Payer: Medicare Other | Admitting: Endocrinology

## 2016-10-27 ENCOUNTER — Encounter: Payer: Self-pay | Admitting: Endocrinology

## 2016-10-27 VITALS — BP 136/80 | HR 60 | Ht 65.0 in | Wt 165.0 lb

## 2016-10-27 DIAGNOSIS — E1165 Type 2 diabetes mellitus with hyperglycemia: Secondary | ICD-10-CM

## 2016-10-27 NOTE — Patient Instructions (Signed)
Repeglanide 2 pills at lunch  Call if > 200  much

## 2016-10-27 NOTE — Progress Notes (Signed)
Patient ID: Myka Hitz, female   DOB: 07-Feb-1928, 82 y.o.   MRN: [id number]   Reason for Appointment: Diabetes follow-up   History of Present Illness   Diagnosis: Type 2 DIABETES MELITUS, date of diagnosis: 1995        She has had mild and usually well-controlled diabetes for several years Previously on metformin and this was stopped because of renal dysfunction Also has been tried on low dose basal insulin previously However with using GLP-1 drugs like Byetta and Victoza her blood sugars have been fairly well controlled She has been on Victoza since 2011   Recent history:  Non-insulin hypoglycemic drugs:  Actos 15 mg in a.m.Marland Kitchen  Prandin 1 mg before breakfast and lunch, 2 at supper.  Victoza 0.9 mg daily      INSULIN regimen: NovoLog 70/30, 8 units at suppertime  Because of significantly high readings including over 300 in October she was started on low-dose premixed insulin at suppertime    Current management, blood sugar patterns and problems identified:  She did not have a functioning meter on her last visit and blood sugars were not reviewed with she does have her meter for download now  Her daughter is helping her reminder to check her sugar and take her insulin and patient is able to take her insulin consistently  Although her blood sugars were better in the morning a few days ago they are little higher in the last week  Most of her high readings are before suppertime although not consistent again  Again checking blood sugars mostly in the morning fasting  Has only a couple of readings after supper and these are not as high   is checking her blood sugars mostly in the morning but unable to get any records except for a few days before 10/13  She is trying to walk and stay as active as possible.  No hypoglycemia  She has 2 prescriptions for Prandin and most likely is taking only 1 mg before breakfast and lunch and 2 tablets at dinner meals  Hypoglycemia:  None  Side effects from medications: None  Monitors blood glucose:  less than once a day.    Glucometer: One Touch  ultra 2/ Verio      Blood Glucose readings from meter as downloaded below  Mean values apply above for all meters except median for One Touch  PRE-MEAL Fasting Lunch Dinner Bedtime Overall  Glucose range: 107-206   121-240  129-189    Mean/median: 118   176   144+/-41    POST-MEAL PC Breakfast PC Lunch PC Dinner  Glucose range: 157-233  201    Mean/median:      :  Meals: 3 meals per day. Dinner 6-7pm  Special K Cereal or raisin bran with cheese in am for breakfast or eggs, variable carbohydrate intake at other meals         Physical activity: exercise: Walking 45 min 5 days a week in the mornings   Dietician visit: Most recent: Years ago          Wt Readings from Last 3 Encounters:  10/27/16 165 lb (74.8 kg)  09/28/16 164 lb (74.4 kg)  08/28/16 159 lb (72.1 kg)    Lab Results  Component Value Date   HGBA1C 6.5 08/28/2016   HGBA1C 7.2 02/25/2016   HGBA1C 6.9 10/08/2015   Lab Results  Component Value Date   MICROALBUR 6.8 (H) 02/25/2016   LDLCALC 53 04/27/2016   CREATININE 1.49 (  H) 08/28/2016      No visits with results within 1 Week(s) from this visit.  Latest known visit with results is:  Office Visit on 09/28/2016  Component Date Value Ref Range Status  . POC Glucose 09/28/2016 144* 70 - 99 mg/dl Final      Medication List       Accurate as of 10/27/16 11:59 PM. Always use your most recent med list.          amLODipine 5 MG tablet Commonly known as:  NORVASC TAKE 1 TABLET (5 MG TOTAL) BY MOUTH DAILY.   aspirin 81 MG tablet Take 81 mg by mouth at bedtime.   B-D ULTRAFINE III SHORT PEN 31G X 8 MM Misc Generic drug:  Insulin Pen Needle   carvedilol 6.25 MG tablet Commonly known as:  COREG Take 6.25 mg by mouth 2 (two) times daily with a meal.   fenofibrate 160 MG tablet TAKE 1 TABLET BY MOUTH EVERY DAY   Fish Oil 1000 MG  Caps Take 1,000 mg by mouth 2 (two) times daily.   glucose blood test strip Commonly known as:  ONE TOUCH ULTRA TEST Use to test blood sugar once daily as instructed. Dx code: E11.65   glucose blood test strip Please Dispense verio test strips Pt is to use twice a day.   guanFACINE 2 MG tablet Commonly known as:  TENEX Take 2 mg by mouth at bedtime.   insulin aspart protamine - aspart (70-30) 100 UNIT/ML FlexPen Commonly known as:  NOVOLOG MIX 70/30 FLEXPEN Inject 0.08 mLs (8 Units total) into the skin daily before supper.   magnesium oxide 400 MG tablet Commonly known as:  MAG-OX Take 400 mg by mouth daily.   meclizine 12.5 MG tablet Commonly known as:  ANTIVERT   multivitamin with minerals Tabs tablet Take 1 tablet by mouth daily.   NEXIUM 40 MG capsule Generic drug:  esomeprazole Take 40 mg by mouth daily.   pioglitazone 15 MG tablet Commonly known as:  ACTOS TAKE 1 TABLET (15 MG TOTAL) BY MOUTH DAILY.   pravastatin 80 MG tablet Commonly known as:  PRAVACHOL Take 80 mg by mouth daily.   raloxifene 60 MG tablet Commonly known as:  EVISTA TAKE 1 TABLET BY MOUTH EVERY DAY   ramipril 10 MG capsule Commonly known as:  ALTACE Take 10 mg by mouth daily.   ranitidine 150 MG tablet Commonly known as:  ZANTAC   repaglinide 1 MG tablet Commonly known as:  PRANDIN TAKE 1 TABLET AT BREAKFAST AND LUNCH AND 2-4 TABLETS AT SUPPER DEPENDING ON MEAL SIZE.   traZODone 50 MG tablet Commonly known as:  DESYREL   VICTOZA 18 MG/3ML Sopn Generic drug:  liraglutide INJECT 1.2 MG INTO THE SKIN DAILY. INJECTS MG PLUS 5 CLICKS   vitamin P-59 1000 MCG tablet Commonly known as:  CYANOCOBALAMIN Take 1,000 mcg by mouth daily.   Vitamin D3 3000 units Tabs Take 1 capsule by mouth at bedtime.       Allergies:  Allergies  Allergen Reactions  . Codeine Nausea And Vomiting    Past Medical History:  Diagnosis Date  . Diabetes mellitus without complication ([place])   .  Hypertension     Past Surgical History:  Procedure Laterality Date  . APPENDECTOMY      Family History  Problem Relation Age of Onset  . Heart disease Father     Social History:  reports that she has never smoked. She has never used smokeless tobacco.  She reports that she does not drink alcohol. Her drug history is not on file.  Review of Systems:  He has mild dementia and is not on any treatments for this, followed by PCP     Osteopenia: She is on Evista from her PCP   HYPERTENSION:  she has had long-standing hypertension treated with multiple drugs. Followed by cardiologist and PCP  Renal dysfunction: She has had abnormal renal function but relatively stable for several years, now not followed by nephrologist    Lab Results  Component Value Date   CREATININE 1.49 (H) 08/28/2016    HYPERLIPIDEMIA: The lipid abnormality consists of elevated LDL and high triglycerides Her Triglycerides have been normal especially with taking fenofibrate consistently  Lab Results  Component Value Date   CHOL 118 04/27/2016   HDL 42.80 04/27/2016   LDLCALC 53 04/27/2016   TRIG 110.0 04/27/2016   CHOLHDL 3 04/27/2016     Examination:   BP 136/80   Pulse 60   Ht 5\' 5"  (1.651 m)   Wt 165 lb (74.8 kg)   SpO2 97%   BMI 27.46 kg/m   Body mass index is 27.46 kg/m.     ASSESSMENT/ PLAN:   Diabetes type 2  See history of present illness for detailed discussion of  current management, blood sugar patterns and problems identified  Her blood sugars are looking fairly good even with only using 8 units of insulin at suppertime She is also on multiple other drugs but which she is compliant with Since most of her higher readings before suppertime will increase her Prandin to 2 tablets at lunch but also discussed that if sugars are consistently high during the day may need a morning injection of insulin also She will call if blood sugars are not consistently controlled A1c on the next  visit    Patient Instructions  Repeglanide 2 pills at lunch  Call if > 200  much    Pressley Barsky 10/29/2016, 6:18 PM

## 2016-11-06 DIAGNOSIS — I10 Essential (primary) hypertension: Secondary | ICD-10-CM | POA: Diagnosis not present

## 2016-11-06 DIAGNOSIS — E785 Hyperlipidemia, unspecified: Secondary | ICD-10-CM | POA: Diagnosis not present

## 2016-11-06 DIAGNOSIS — E119 Type 2 diabetes mellitus without complications: Secondary | ICD-10-CM | POA: Diagnosis not present

## 2016-11-06 DIAGNOSIS — Z79899 Other long term (current) drug therapy: Secondary | ICD-10-CM | POA: Diagnosis not present

## 2016-11-06 DIAGNOSIS — K219 Gastro-esophageal reflux disease without esophagitis: Secondary | ICD-10-CM | POA: Diagnosis not present

## 2016-11-11 ENCOUNTER — Other Ambulatory Visit: Payer: Self-pay | Admitting: Endocrinology

## 2016-11-13 ENCOUNTER — Other Ambulatory Visit: Payer: Self-pay | Admitting: Endocrinology

## 2016-12-28 ENCOUNTER — Ambulatory Visit (INDEPENDENT_AMBULATORY_CARE_PROVIDER_SITE_OTHER): Payer: Medicare Other | Admitting: Endocrinology

## 2016-12-28 ENCOUNTER — Telehealth: Payer: Self-pay | Admitting: Endocrinology

## 2016-12-28 ENCOUNTER — Encounter: Payer: Self-pay | Admitting: Endocrinology

## 2016-12-28 VITALS — BP 162/78 | HR 74 | Ht 65.0 in | Wt 163.0 lb

## 2016-12-28 DIAGNOSIS — E1165 Type 2 diabetes mellitus with hyperglycemia: Secondary | ICD-10-CM

## 2016-12-28 DIAGNOSIS — Z794 Long term (current) use of insulin: Secondary | ICD-10-CM | POA: Diagnosis not present

## 2016-12-28 LAB — COMPREHENSIVE METABOLIC PANEL
ALK PHOS: 47 U/L (ref 39–117)
ALT: 13 U/L (ref 0–35)
AST: 79 U/L — AB (ref 0–37)
Albumin: 3.8 g/dL (ref 3.5–5.2)
BUN: 27 mg/dL — AB (ref 6–23)
CALCIUM: 9.1 mg/dL (ref 8.4–10.5)
CO2: 27 mEq/L (ref 19–32)
CREATININE: 1.5 mg/dL — AB (ref 0.40–1.20)
Chloride: 106 mEq/L (ref 96–112)
GFR: 34.91 mL/min — ABNORMAL LOW (ref >60.00)
Glucose, Bld: 147 mg/dL — ABNORMAL HIGH (ref 70–99)
Potassium: 3.7 mEq/L (ref 3.5–5.1)
SODIUM: 139 meq/L (ref 135–145)
TOTAL PROTEIN: 6.3 g/dL (ref 6.0–8.3)
Total Bilirubin: 0.8 mg/dL (ref 0.2–1.2)

## 2016-12-28 LAB — MICROALBUMIN / CREATININE URINE RATIO
Creatinine,U: 198 mg/dL
Microalb Creat Ratio: 6 mg/g (ref 0.0–30.0)
Microalb, Ur: 11.9 mg/dL — ABNORMAL HIGH (ref 0.0–1.9)

## 2016-12-28 LAB — POCT GLYCOSYLATED HEMOGLOBIN (HGB A1C): Hemoglobin A1C: 6.8

## 2016-12-28 NOTE — Telephone Encounter (Signed)
Blood sugars are excellent

## 2016-12-28 NOTE — Progress Notes (Signed)
Patient ID: Vanessa Craig, female   DOB: 1928/09/28, 83 y.o.   MRN: [id number]   Reason for Appointment: Diabetes follow-up   History of Present Illness   Diagnosis: Type 2 DIABETES MELITUS, date of diagnosis: 1995        She has had mild and usually well-controlled diabetes for several years Previously on metformin and this was stopped because of renal dysfunction Also has been tried on low dose basal insulin previously However with using GLP-1 drugs like Byetta and Victoza her blood sugars have been fairly well controlled She has been on Victoza since 2011   Recent history:  Non-insulin hypoglycemic drugs:  Actos 15 mg in a.m.Marland Kitchen  Prandin 1 mg before breakfast, 2 mg at lunch, 2 at supper.  Victoza 0.9 mg daily      INSULIN regimen: NovoLog 70/30, 8 units at suppertime  Because of significantly high readings including over 300 in October she was started on low-dose premixed insulin at suppertime  Although her A1c is relatively higher at 6.8, it has been usually well-controlled    Current management, blood sugar patterns and problems identified:  She did not bring her monitor for download but blood sugars were available later in the afternoon when she called back.  She has not expressed any hypoglycemia  She does continue to take insulin regularly  Her daughter is helping her with her medications since she has more difficulties with memory now  Since she had relatively high readings in the afternoon her Prandin was increased to 2 mg at lunch  She is trying to walk for exercise as before  Her weight is slightly better   Hypoglycemia: None  Side effects from medications: None  Monitors blood glucose:  less than once a day.    Glucometer: One Touch  ultra 2/ Verio      Blood Glucose readings  This morning, 143 Yesterday at Lunch, 91 1/30: 104 AM, 146 bedtime 1/29: 141 PM and 1/28: 116  Meals: 3 meals per day. Dinner 6-7pm  Special K Cereal or raisin bran  with cheese in am for breakfast or eggs, variable carbohydrate intake at other meals         Physical activity: exercise: Walking 45 min 5 days a week at [employer] visit: Most recent: Years ago          Wt Readings from Last 3 Encounters:  12/28/16 163 lb (73.9 kg)  10/27/16 165 lb (74.8 kg)  09/28/16 164 lb (74.4 kg)    Lab Results  Component Value Date   HGBA1C 6.8 12/28/2016   HGBA1C 6.5 08/28/2016   HGBA1C 7.2 02/25/2016   Lab Results  Component Value Date   MICROALBUR 11.9 (H) 12/28/2016   LDLCALC 53 04/27/2016   CREATININE 1.50 (H) 12/28/2016     OTHER active problems: See review of systems   Office Visit on 12/28/2016  Component Date Value Ref Range Status  . Sodium 12/28/2016 139  135 - 145 mEq/L Final  . Potassium 12/28/2016 3.7  3.5 - 5.1 mEq/L Final  . Chloride 12/28/2016 106  96 - 112 mEq/L Final  . CO2 12/28/2016 27  19 - 32 mEq/L Final  . Glucose, Bld 12/28/2016 147* 70 - 99 mg/dL Final  . BUN 12/28/2016 27* 6 - 23 mg/dL Final  . Creatinine, Ser 12/28/2016 1.50* 0.40 - 1.20 mg/dL Final  . Total Bilirubin 12/28/2016 0.8  0.2 - 1.2 mg/dL Final  . Alkaline Phosphatase 12/28/2016 47  39 -  117 U/L Final  . AST 12/28/2016 79* 0 - 37 U/L Final  . ALT 12/28/2016 13  0 - 35 U/L Final  . Total Protein 12/28/2016 6.3  6.0 - 8.3 g/dL Final  . Albumin 12/28/2016 3.8  3.5 - 5.2 g/dL Final  . Calcium 12/28/2016 9.1  8.4 - 10.5 mg/dL Final  . GFR 12/28/2016 34.91* >60.00 mL/min Final  . Microalb, Ur 12/28/2016 11.9* 0.0 - 1.9 mg/dL Final  . Creatinine,U 12/28/2016 198.0  mg/dL Final  . Microalb Creat Ratio 12/28/2016 6.0  0.0 - 30.0 mg/g Final  . Hemoglobin A1C 12/28/2016 6.8   Final    Allergies as of 12/28/2016      Reactions   Codeine Nausea And Vomiting      Medication List       Accurate as of 12/28/16  8:22 PM. Always use your most recent med list.          amLODipine 5 MG tablet Commonly known as:  NORVASC TAKE 1 TABLET (5 MG TOTAL)  BY MOUTH DAILY.   aspirin 81 MG tablet Take 81 mg by mouth at bedtime.   B-D ULTRAFINE III SHORT PEN 31G X 8 MM Misc Generic drug:  Insulin Pen Needle   carvedilol 6.25 MG tablet Commonly known as:  COREG Take 6.25 mg by mouth 2 (two) times daily with a meal.   fenofibrate 160 MG tablet TAKE 1 TABLET BY MOUTH EVERY DAY   Fish Oil 1000 MG Caps Take 1,000 mg by mouth 2 (two) times daily.   glucose blood test strip Commonly known as:  ONE TOUCH ULTRA TEST Use to test blood sugar once daily as instructed. Dx code: E11.65   glucose blood test strip Please Dispense verio test strips Pt is to use twice a day.   guanFACINE 2 MG tablet Commonly known as:  TENEX Take 2 mg by mouth at bedtime.   hydrochlorothiazide 12.5 MG capsule Commonly known as:  MICROZIDE TAKE ONE CAPSULE BY MOUTH DAILY   magnesium oxide 400 MG tablet Commonly known as:  MAG-OX Take 400 mg by mouth daily.   meclizine 12.5 MG tablet Commonly known as:  ANTIVERT   multivitamin with minerals Tabs tablet Take 1 tablet by mouth daily.   NEXIUM 40 MG capsule Generic drug:  esomeprazole Take 40 mg by mouth daily.   NOVOLOG MIX 70/30 FLEXPEN (70-30) 100 UNIT/ML FlexPen Generic drug:  insulin aspart protamine - aspart INJECT 0.08 MLS (8 UNITS TOTAL) INTO THE SKIN DAILY BEFORE SUPPER.   pioglitazone 15 MG tablet Commonly known as:  ACTOS TAKE 1 TABLET (15 MG TOTAL) BY MOUTH DAILY.   pravastatin 80 MG tablet Commonly known as:  PRAVACHOL Take 80 mg by mouth daily.   raloxifene 60 MG tablet Commonly known as:  EVISTA TAKE 1 TABLET BY MOUTH EVERY DAY   ramipril 10 MG capsule Commonly known as:  ALTACE Take 10 mg by mouth daily.   ranitidine 150 MG tablet Commonly known as:  ZANTAC   repaglinide 1 MG tablet Commonly known as:  PRANDIN TAKE 1 TABLET AT BREAKFAST AND LUNCH AND 2-4 TABLETS AT SUPPER DEPENDING ON MEAL SIZE.   traZODone 50 MG tablet Commonly known as:  DESYREL   VICTOZA 18 MG/3ML  Sopn Generic drug:  liraglutide INJECT 1.2 MG INTO THE SKIN DAILY. INJECTS MG PLUS 5 CLICKS   vitamin K-02 1000 MCG tablet Commonly known as:  CYANOCOBALAMIN Take 1,000 mcg by mouth daily.   Vitamin D3 3000 units Tabs  Take 1 capsule by mouth at bedtime.       Allergies:  Allergies  Allergen Reactions  . Codeine Nausea And Vomiting    Past Medical History:  Diagnosis Date  . Diabetes mellitus without complication ([place])   . Hypertension     Past Surgical History:  Procedure Laterality Date  . APPENDECTOMY      Family History  Problem Relation Age of Onset  . Heart disease Father     Social History:  reports that she has never smoked. She has never used smokeless tobacco. She reports that she does not drink alcohol. Her drug history is not on file.  Review of Systems:  He has mild dementia and is not on any treatments for this, followed by PCP     Osteopenia: She is on Evista from her PCP   HYPERTENSION:  she has had long-standing hypertension treated with multiple drugs. Followed by cardiologist and PCP  She thinks blood pressure was fairly good PCP but higher today, may be anxious  BP Readings from Last 3 Encounters:  12/28/16 (!) 162/78  10/27/16 136/80  09/28/16 140/70     Renal dysfunction: She has had abnormal renal function but this has been relatively stable for several years   Lab Results  Component Value Date   CREATININE 1.50 (H) 12/28/2016    HYPERLIPIDEMIA: The lipid abnormality consists of elevated LDL and high triglycerides Her Triglycerides have been normal Current treatment regimen is pravastatin 80 mg and fenofibrate 160 mg She reportedly has had labs done through PCP but report not available   Lab Results  Component Value Date   CHOL 118 04/27/2016   HDL 42.80 04/27/2016   LDLCALC 53 04/27/2016   TRIG 110.0 04/27/2016   CHOLHDL 3 04/27/2016     Examination:   BP (!) 162/78   Pulse 74   Ht 5\' 5"  (1.651 m)   Wt 163 lb  (73.9 kg)   SpO2 97%   BMI 27.12 kg/m   Body mass index is 27.12 kg/m.     ASSESSMENT/ PLAN:   Diabetes type 2  See history of present illness for detailed discussion of  current management, blood sugar patterns and problems identified  Her blood sugars are looking fairly good  Has had significant improvement with adding only small amount of premixed insulin at suppertime She is also on multiple other drugs including mealtime Prandin She is fairly consistent with her medications Also doing great with her exercise program and able to keep her weight relatively stable  HYPERTENSION: Blood pressure is higher than usual today, apparently not high with her PCP last month Recommended she follow-up with her PCP for another blood pressure check in another month   She will continue the same medication regimen for now  Patient Instructions  Call sugar readings for last 3 days  Check blood sugars on waking up  3x weekly  Also check blood sugars about 2 hours after a meal and do this after different meals by rotation  Recommended blood sugar levels on waking up is 90-140 and about 2 hours after meal is 130-180  Please bring your blood sugar monitor to each visit, thank you     [facility] 12/28/2016, 8:22 PM

## 2016-12-28 NOTE — Patient Instructions (Addendum)
Call sugar readings for last 3 days  Check blood sugars on waking up  3x weekly  Also check blood sugars about 2 hours after a meal and do this after different meals by rotation  Recommended blood sugar levels on waking up is 90-140 and about 2 hours after meal is 130-180  Please bring your blood sugar monitor to each visit, thank you

## 2016-12-28 NOTE — Telephone Encounter (Signed)
Pt called in with her sugar readings: This morning, 143 Yesterday at Lunch, 91 1/30: 104 AM, 146 bedtime 1/29: 141 PM 1/28: 116

## 2017-01-18 ENCOUNTER — Other Ambulatory Visit: Payer: Self-pay | Admitting: Endocrinology

## 2017-01-18 MED ORDER — BD PEN NEEDLE SHORT U/F 31G X 8 MM MISC: 2 refills | Status: DC

## 2017-01-30 ENCOUNTER — Other Ambulatory Visit: Payer: Self-pay | Admitting: Endocrinology

## 2017-02-02 ENCOUNTER — Other Ambulatory Visit: Payer: Self-pay | Admitting: Endocrinology

## 2017-03-16 DIAGNOSIS — I10 Essential (primary) hypertension: Secondary | ICD-10-CM | POA: Diagnosis not present

## 2017-03-27 ENCOUNTER — Ambulatory Visit (INDEPENDENT_AMBULATORY_CARE_PROVIDER_SITE_OTHER): Payer: Medicare Other | Admitting: Endocrinology

## 2017-03-27 ENCOUNTER — Encounter: Payer: Self-pay | Admitting: Endocrinology

## 2017-03-27 VITALS — BP 136/68 | HR 73 | Ht 65.0 in | Wt 165.4 lb

## 2017-03-27 DIAGNOSIS — E1165 Type 2 diabetes mellitus with hyperglycemia: Secondary | ICD-10-CM

## 2017-03-27 DIAGNOSIS — Z794 Long term (current) use of insulin: Secondary | ICD-10-CM | POA: Diagnosis not present

## 2017-03-27 LAB — COMPREHENSIVE METABOLIC PANEL
ALT: 11 U/L (ref 0–35)
AST: 15 U/L (ref 0–37)
Albumin: 3.9 g/dL (ref 3.5–5.2)
Alkaline Phosphatase: 57 U/L (ref 39–117)
BUN: 27 mg/dL — ABNORMAL HIGH (ref 6–23)
CO2: 30 mEq/L (ref 19–32)
Calcium: 9.7 mg/dL (ref 8.4–10.5)
Chloride: 104 mEq/L (ref 96–112)
Creatinine, Ser: 1.43 mg/dL — ABNORMAL HIGH (ref 0.40–1.20)
GFR: 36.87 mL/min — ABNORMAL LOW (ref >60.00)
Glucose, Bld: 190 mg/dL — ABNORMAL HIGH (ref 70–99)
Potassium: 4.3 mEq/L (ref 3.5–5.1)
Sodium: 138 mEq/L (ref 135–145)
Total Bilirubin: 1 mg/dL (ref 0.2–1.2)
Total Protein: 6.8 g/dL (ref 6.0–8.3)

## 2017-03-27 LAB — POCT GLYCOSYLATED HEMOGLOBIN (HGB A1C): Hemoglobin A1C: 7.2

## 2017-03-27 LAB — LIPID PANEL
CHOL/HDL RATIO: 3
CHOLESTEROL: 158 mg/dL (ref 0–200)
HDL: 45.9 mg/dL (ref >39.00)
LDL CALC: 73 mg/dL (ref 0–99)
NONHDL: 112.14
Triglycerides: 197 mg/dL — ABNORMAL HIGH (ref 0.0–149.0)
VLDL: 39.4 mg/dL (ref 0.0–40.0)

## 2017-03-27 NOTE — Progress Notes (Signed)
Patient ID: Vanessa Craig, female   DOB: Feb 21, 1928, 83 y.o.   MRN: [id number]   Reason for Appointment: Diabetes follow-up   History of Present Illness   Diagnosis: Type 2 DIABETES MELITUS, date of diagnosis: 1995        She has had mild and usually well-controlled diabetes for several years Previously on metformin and this was stopped because of renal dysfunction Also has been tried on low dose basal insulin previously However with using GLP-1 drugs like Byetta and Victoza her blood sugars have been fairly well controlled She has been on Victoza since 2011   Recent history:  Non-insulin hypoglycemic drugs:  Actos 15 mg in a.m.Marland Kitchen  Prandin 1 mg before breakfast, 2 mg at lunch, 2 at supper.  Victoza 0.9 mg daily      INSULIN regimen: NovoLog 70/30, 8 units at suppertime  Because of significantly high readings including over 300 in October 2017 she was started on low-dose premixed insulin at suppertime   A1c is relatively higher at  7.2    Current management, blood sugar patterns and problems identified:  She  Lives alone and not clear if she has consistent compliant with all her medications or her insulin, her daughter thinks that she is compliant  However she has sporadic high readings over 200 at times in the evenings and her fasting reading was 174 this morning  She is generally fairly good with her diet but still likes to eat cereal in the morning  Has gained 2 pounds but has not been able to exercise as regularly because of knee pain  The patient thinks that she did not eat anything unusual yesterday to cause her blood sugar to be over 200, usually eating a sandwich at lunch and generally no desserts  Blood sugars at night are variable, only once below 100   Hypoglycemia: None  Side effects from medications: None  Monitors blood glucose:  less than once a day.    Glucometer: One Touch Verio      Blood Glucose readings   Mean values apply above for all  meters except median for One Touch  PRE-MEAL Fasting Lunch Dinner Bedtime Overall  Glucose range:  94-1 74   151  128-197  96- 222   Mean/median:      149    Meals: 3 meals per day. Dinner 6-7pm  Special K Cereal or raisin bran with cheese in am for breakfast or eggs, variable carbohydrate intake at other meals         Physical activity: exercise: Walking 45 min 5 days a week at gym  Dietician visit: Most recent: Years ago          Wt Readings from Last 3 Encounters:  03/27/17 165 lb 6.4 oz (75 kg)  12/28/16 163 lb (73.9 kg)  10/27/16 165 lb (74.8 kg)    Lab Results  Component Value Date   HGBA1C 7.2 03/27/2017   HGBA1C 6.8 12/28/2016   HGBA1C 6.5 08/28/2016   Lab Results  Component Value Date   MICROALBUR 11.9 (H) 12/28/2016   LDLCALC 53 04/27/2016   CREATININE 1.50 (H) 12/28/2016     OTHER active problems: See review of systems   Office Visit on 03/27/2017  Component Date Value Ref Range Status  . POC Glucose 03/27/2017 7.2* 70 - 99 mg/dl Final  . Hemoglobin A1C 03/27/2017 7.2   Final    Allergies as of 03/27/2017      Reactions   Codeine Nausea  And Vomiting      Medication List       Accurate as of 03/27/17  3:39 PM. Always use your most recent med list.          amLODipine 5 MG tablet Commonly known as:  NORVASC TAKE 1 TABLET (5 MG TOTAL) BY MOUTH DAILY.   aspirin 81 MG tablet Take 81 mg by mouth at bedtime.   B-D ULTRAFINE III SHORT PEN 31G X 8 MM Misc Generic drug:  Insulin Pen Needle Use 2 daily   carvedilol 6.25 MG tablet Commonly known as:  COREG Take 6.25 mg by mouth 2 (two) times daily with a meal.   fenofibrate 160 MG tablet TAKE 1 TABLET BY MOUTH EVERY DAY   Fish Oil 1000 MG Caps Take 1,000 mg by mouth 2 (two) times daily.   glucose blood test strip Commonly known as:  ONE TOUCH ULTRA TEST Use to test blood sugar once daily as instructed. Dx code: E11.65   glucose blood test strip Please Dispense verio test strips Pt is to use  twice a day.   guanFACINE 2 MG tablet Commonly known as:  TENEX Take 2 mg by mouth at bedtime.   hydrochlorothiazide 12.5 MG capsule Commonly known as:  MICROZIDE TAKE ONE CAPSULE BY MOUTH DAILY   magnesium oxide 400 MG tablet Commonly known as:  MAG-OX Take 400 mg by mouth daily.   meclizine 12.5 MG tablet Commonly known as:  ANTIVERT   multivitamin with minerals Tabs tablet Take 1 tablet by mouth daily.   NEXIUM 40 MG capsule Generic drug:  esomeprazole Take 40 mg by mouth daily.   NOVOLOG MIX 70/30 FLEXPEN (70-30) 100 UNIT/ML FlexPen Generic drug:  insulin aspart protamine - aspart INJECT 0.08 MLS (8 UNITS TOTAL) INTO THE SKIN DAILY BEFORE SUPPER.   pioglitazone 15 MG tablet Commonly known as:  ACTOS TAKE 1 TABLET (15 MG TOTAL) BY MOUTH DAILY.   pravastatin 80 MG tablet Commonly known as:  PRAVACHOL Take 80 mg by mouth daily.   raloxifene 60 MG tablet Commonly known as:  EVISTA TAKE 1 TABLET BY MOUTH EVERY DAY   ramipril 10 MG capsule Commonly known as:  ALTACE Take 10 mg by mouth daily.   ranitidine 150 MG tablet Commonly known as:  ZANTAC   repaglinide 1 MG tablet Commonly known as:  PRANDIN TAKE 1 TABLET AT BREAKFAST AND LUNCH AND 2-4 TABLETS AT SUPPER DEPENDING ON MEAL SIZE.   traZODone 50 MG tablet Commonly known as:  DESYREL   VICTOZA 18 MG/3ML Sopn Generic drug:  liraglutide INJECT 1.2 MG INTO THE SKIN DAILY. INJECTS MG PLUS 5 CLICKS   vitamin Z-61 1000 MCG tablet Commonly known as:  CYANOCOBALAMIN Take 1,000 mcg by mouth daily.   Vitamin D3 3000 units Tabs Take 1 capsule by mouth at bedtime.       Allergies:  Allergies  Allergen Reactions  . Codeine Nausea And Vomiting    Past Medical History:  Diagnosis Date  . Diabetes mellitus without complication ([place])   . Hypertension     Past Surgical History:  Procedure Laterality Date  . APPENDECTOMY      Family History  Problem Relation Age of Onset  . Heart disease Father      Social History:  reports that she has never smoked. She has never used smokeless tobacco. She reports that she does not drink alcohol. Her drug history is not on file.  Review of Systems:  He has mild dementia and  is followed by PCP     Osteopenia: She is on Evista from her PCP   HYPERTENSION:  she has had long-standing hypertension treated with multiple drugs. Followed by cardiologist and PCP  Recently seen by cardiologist and no changes made   BP Readings from Last 3 Encounters:  03/27/17 136/68  12/28/16 (!) 162/78  10/27/16 136/80     Renal dysfunction: She has had abnormal renal function but this has been relatively stable for several years   Lab Results  Component Value Date   CREATININE 1.50 (H) 12/28/2016    HYPERLIPIDEMIA: The lipid abnormality consists of elevated LDL and high triglycerides Her Triglycerides have been normal Current treatment regimen is pravastatin 80 mg and fenofibrate 160 mg Again no labs available from PCP   Lab Results  Component Value Date   CHOL 118 04/27/2016   HDL 42.80 04/27/2016   LDLCALC 53 04/27/2016   TRIG 110.0 04/27/2016   CHOLHDL 3 04/27/2016     Examination:   BP 136/68 (Cuff Size: Large)   Pulse 73   Ht 5\' 5"  (1.651 m)   Wt 165 lb 6.4 oz (75 kg)   SpO2 98%   BMI 27.52 kg/m   Body mass index is 27.52 kg/m.     ASSESSMENT/ PLAN:   Diabetes type 2  See history of present illness for detailed discussion of  current management, blood sugar patterns and problems identified  She has some variability in her blood sugars and has some high readings either related to her forgetting one of her medications or insulin or variability in the diet A1c is 7.2 but has been as low as 6.5 before However considering her age and comorbid conditions this is still very good She is trying to get back into her walking and this may help Given her a logbook to keep her insulin record for better compliance  HYPERTENSION: Blood  pressure is not low standing up, was low normal initially sitting down  LIPIDS: We will check these today  There are no Patient Instructions on file for this visit.   Ritvik Mczeal 03/27/2017, 3:39 PM

## 2017-04-02 NOTE — Progress Notes (Signed)
Order(s) created erroneously. Erroneous order ID: [id number]  Order moved by: Willy Eddy  Order move date/time: 04/02/2017 11:26 AM  Source Patient: [id number]  Source Contact: 03/27/2017  Destination Patient: [id number]  Destination Contact: 02/11/2013

## 2017-04-10 ENCOUNTER — Other Ambulatory Visit: Payer: Self-pay | Admitting: Endocrinology

## 2017-04-28 ENCOUNTER — Other Ambulatory Visit: Payer: Self-pay | Admitting: Endocrinology

## 2017-05-10 DIAGNOSIS — E785 Hyperlipidemia, unspecified: Secondary | ICD-10-CM | POA: Diagnosis not present

## 2017-05-10 DIAGNOSIS — R413 Other amnesia: Secondary | ICD-10-CM | POA: Diagnosis not present

## 2017-05-10 DIAGNOSIS — E119 Type 2 diabetes mellitus without complications: Secondary | ICD-10-CM | POA: Diagnosis not present

## 2017-05-10 DIAGNOSIS — K219 Gastro-esophageal reflux disease without esophagitis: Secondary | ICD-10-CM | POA: Diagnosis not present

## 2017-05-10 DIAGNOSIS — I10 Essential (primary) hypertension: Secondary | ICD-10-CM | POA: Diagnosis not present

## 2017-05-18 DIAGNOSIS — R413 Other amnesia: Secondary | ICD-10-CM | POA: Diagnosis not present

## 2017-05-18 DIAGNOSIS — K219 Gastro-esophageal reflux disease without esophagitis: Secondary | ICD-10-CM | POA: Diagnosis not present

## 2017-05-18 DIAGNOSIS — E785 Hyperlipidemia, unspecified: Secondary | ICD-10-CM | POA: Diagnosis not present

## 2017-05-18 DIAGNOSIS — Z79899 Other long term (current) drug therapy: Secondary | ICD-10-CM | POA: Diagnosis not present

## 2017-06-25 DIAGNOSIS — K5730 Diverticulosis of large intestine without perforation or abscess without bleeding: Secondary | ICD-10-CM | POA: Diagnosis not present

## 2017-06-25 DIAGNOSIS — K219 Gastro-esophageal reflux disease without esophagitis: Secondary | ICD-10-CM | POA: Diagnosis not present

## 2017-06-26 ENCOUNTER — Encounter: Payer: Self-pay | Admitting: Endocrinology

## 2017-06-26 ENCOUNTER — Ambulatory Visit (INDEPENDENT_AMBULATORY_CARE_PROVIDER_SITE_OTHER): Payer: Medicare Other | Admitting: Endocrinology

## 2017-06-26 VITALS — BP 146/70 | HR 65 | Ht 65.0 in | Wt 166.4 lb

## 2017-06-26 DIAGNOSIS — Z794 Long term (current) use of insulin: Secondary | ICD-10-CM | POA: Diagnosis not present

## 2017-06-26 DIAGNOSIS — E1165 Type 2 diabetes mellitus with hyperglycemia: Secondary | ICD-10-CM | POA: Diagnosis not present

## 2017-06-26 LAB — POCT GLYCOSYLATED HEMOGLOBIN (HGB A1C): HEMOGLOBIN A1C: 7.2

## 2017-06-26 NOTE — Progress Notes (Signed)
Patient ID: Vanessa Craig, female   DOB: 12/17/1927, 83 y.o.   MRN: [id number]   Reason for Appointment: Diabetes follow-up   History of Present Illness   Diagnosis: Type 2 DIABETES MELITUS, date of diagnosis: 1995        She has had mild and usually well-controlled diabetes for several years Previously on metformin and this was stopped because of renal dysfunction Also has been tried on low dose basal insulin previously However with using GLP-1 drugs like Byetta and Victoza her blood sugars have been fairly well controlled She has been on Victoza since 2011   Recent history:  Non-insulin hypoglycemic drugs:  Actos 15 mg in a.m.Marland Kitchen  Prandin 1 mg before breakfast, 2 mg at lunch, 2 at supper.  Victoza 0.9 mg daily      INSULIN regimen: NovoLog 70/30, 8 units at suppertime  Because of significantly high readings including over 300 in October 2017 she was started on low-dose premixed insulin at suppertime  Her A1c 7.2% again, has been lower before    Current management, blood sugar patterns and problems identified:  She thinks she is very consistent with taking her insulin at suppertime daily and her daughter is trying to help with compliance  However she did not use the logbook given last time to help her with compliance  HIGHEST blood sugars are around lunchtime and she still continues to eat cereal in the morning; does not always have protein although sometimes will have a cheese stick  FASTING blood sugars are excellent with that sugars averaging about 105 without overnight hypoglycemia  She is checking more readings in the evenings but these are quite variable depending on what she is eating  She thinks she is eating out very infrequently and will take her insulin with her when she does so  Only sometimes will have some dessert  She is also taking her Victoza in the evenings but separately at bedtime  She is supposed to take 1 mg of Prandin at breakfast, 1 at  lunch and 2 at dinnertime and she thinks she is doing this regularly  Her weight is stable  She is still trying to exercise as much as tolerable   Hypoglycemia: None  Side effects from medications: None  Monitors blood glucose:  less than once a day.    Glucometer: One Touch Verio      Blood Glucose readings   Mean values apply above for all meters except median for One Touch  PRE-MEAL Fasting Lunch Dinner Bedtime Overall  Glucose range:  179, 242      Mean/median: 108   115      POST-MEAL PC Breakfast PC Lunch PC Dinner  Glucose range:   163-209   Mean/median:   192     Meals: 3 meals per day. Dinner 6-7pm  Special K Cereal or raisin bran Sometimes with cheese in am for breakfast or rarely eggs, variable carbohydrate intake at other meals         Physical activity: exercise: Walking 45 min 3-5 days a week at gym  Dietician visit: Most recent: Years ago          Wt Readings from Last 3 Encounters:  06/26/17 166 lb 6.4 oz (75.5 kg)  03/27/17 165 lb 6.4 oz (75 kg)  12/28/16 163 lb (73.9 kg)    Lab Results  Component Value Date   HGBA1C 7.2 03/27/2017   HGBA1C 6.8 12/28/2016   HGBA1C 6.5 08/28/2016   Lab Results  Component Value Date   MICROALBUR 11.9 (H) 12/28/2016   LDLCALC 73 03/27/2017   CREATININE 1.43 (H) 03/27/2017     OTHER active problems: See review of systems   No visits with results within 1 Week(s) from this visit.  Latest known visit with results is:  Office Visit on 03/27/2017  Component Date Value Ref Range Status  . Sodium 03/27/2017 138  135 - 145 mEq/L Final  . Potassium 03/27/2017 4.3  3.5 - 5.1 mEq/L Final  . Chloride 03/27/2017 104  96 - 112 mEq/L Final  . CO2 03/27/2017 30  19 - 32 mEq/L Final  . Glucose, Bld 03/27/2017 190* 70 - 99 mg/dL Final  . BUN 03/27/2017 27* 6 - 23 mg/dL Final  . Creatinine, Ser 03/27/2017 1.43* 0.40 - 1.20 mg/dL Final  . Total Bilirubin 03/27/2017 1.0  0.2 - 1.2 mg/dL Final  . Alkaline Phosphatase  03/27/2017 57  39 - 117 U/L Final  . AST 03/27/2017 15  0 - 37 U/L Final  . ALT 03/27/2017 11  0 - 35 U/L Final  . Total Protein 03/27/2017 6.8  6.0 - 8.3 g/dL Final  . Albumin 03/27/2017 3.9  3.5 - 5.2 g/dL Final  . Calcium 03/27/2017 9.7  8.4 - 10.5 mg/dL Final  . GFR 03/27/2017 36.87* >60.00 mL/min Final  . Cholesterol 03/27/2017 158  0 - 200 mg/dL Final   ATP III Classification       Desirable:  < 200 mg/dL               Borderline High:  200 - 239 mg/dL          High:  > = 240 mg/dL  . Triglycerides 03/27/2017 197.0* 0.0 - 149.0 mg/dL Final   Normal:  <150 mg/dLBorderline High:  150 - 199 mg/dL  . HDL 03/27/2017 45.90  >39.00 mg/dL Final  . VLDL 03/27/2017 39.4  0.0 - 40.0 mg/dL Final  . LDL Cholesterol 03/27/2017 73  0 - 99 mg/dL Final  . Total CHOL/HDL Ratio 03/27/2017 3   Final                  Men          Women1/2 Average Risk     3.4          3.3Average Risk          5.0          4.42X Average Risk          9.6          7.13X Average Risk          15.0          11.0                      . NonHDL 03/27/2017 112.14   Final   NOTE:  Non-HDL goal should be 30 mg/dL higher than patient's LDL goal (i.e. LDL goal of < 70 mg/dL, would have non-HDL goal of < 100 mg/dL)  . Hemoglobin A1C 03/27/2017 7.2   Final    Allergies as of 06/26/2017      Reactions   Codeine Nausea And Vomiting      Medication List       Accurate as of 06/26/17  1:06 PM. Always use your most recent med list.          amLODipine 5 MG tablet Commonly known as:  NORVASC TAKE 1 TABLET (5 MG  TOTAL) BY MOUTH DAILY.   aspirin 81 MG tablet Take 81 mg by mouth at bedtime.   B-D ULTRAFINE III SHORT PEN 31G X 8 MM Misc Generic drug:  Insulin Pen Needle Use 2 daily   carvedilol 6.25 MG tablet Commonly known as:  COREG Take 6.25 mg by mouth 2 (two) times daily with a meal.   donepezil 5 MG tablet Commonly known as:  ARICEPT Take 5 mg by mouth at bedtime.   fenofibrate 160 MG tablet TAKE 1 TABLET BY MOUTH  EVERY DAY   Fish Oil 1000 MG Caps Take 1,000 mg by mouth 2 (two) times daily.   glucose blood test strip Commonly known as:  ONE TOUCH ULTRA TEST Use to test blood sugar once daily as instructed. Dx code: E11.65   glucose blood test strip Please Dispense verio test strips Pt is to use twice a day.   guanFACINE 2 MG tablet Commonly known as:  TENEX Take 2 mg by mouth at bedtime.   hydrochlorothiazide 12.5 MG capsule Commonly known as:  MICROZIDE TAKE ONE CAPSULE BY MOUTH DAILY   magnesium oxide 400 MG tablet Commonly known as:  MAG-OX Take 400 mg by mouth daily.   meclizine 12.5 MG tablet Commonly known as:  ANTIVERT   multivitamin with minerals Tabs tablet Take 1 tablet by mouth daily.   NEXIUM 40 MG capsule Generic drug:  esomeprazole Take 40 mg by mouth daily.   NOVOLOG MIX 70/30 FLEXPEN (70-30) 100 UNIT/ML FlexPen Generic drug:  insulin aspart protamine - aspart INJECT 0.08 MLS (8 UNITS TOTAL) INTO THE SKIN DAILY BEFORE SUPPER.   pioglitazone 15 MG tablet Commonly known as:  ACTOS TAKE 1 TABLET (15 MG TOTAL) BY MOUTH DAILY.   pravastatin 80 MG tablet Commonly known as:  PRAVACHOL Take 80 mg by mouth daily.   raloxifene 60 MG tablet Commonly known as:  EVISTA TAKE 1 TABLET BY MOUTH EVERY DAY   ramipril 10 MG capsule Commonly known as:  ALTACE Take 10 mg by mouth daily.   ranitidine 150 MG tablet Commonly known as:  ZANTAC   repaglinide 1 MG tablet Commonly known as:  PRANDIN TAKE 1 TABLET AT BREAKFAST AND LUNCH AND 2-4 TABLETS AT SUPPER DEPENDING ON MEAL SIZE.   traZODone 50 MG tablet Commonly known as:  DESYREL   VICTOZA 18 MG/3ML Sopn Generic drug:  liraglutide INJECT 1.2 MG INTO THE SKIN DAILY.   vitamin B-12 1000 MCG tablet Commonly known as:  CYANOCOBALAMIN Take 1,000 mcg by mouth daily.   Vitamin D3 3000 units Tabs Take 1 capsule by mouth at bedtime.       Allergies:  Allergies  Allergen Reactions  . Codeine Nausea And Vomiting      Past Medical History:  Diagnosis Date  . Diabetes mellitus without complication ([place])   . Hypertension     Past Surgical History:  Procedure Laterality Date  . APPENDECTOMY      Family History  Problem Relation Age of Onset  . Heart disease Father     Social History:  reports that she has never smoked. She has never used smokeless tobacco. She reports that she does not drink alcohol. Her drug history is not on file.  Review of Systems:  He has mild dementia and is followed by PCP     Osteopenia: She is on Evista from her PCP   HYPERTENSION:  she has had long-standing hypertension treated with multiple drugs. Followed by cardiologist and PCP  Recently seen by  cardiologist and no changes made   BP Readings from Last 3 Encounters:  06/26/17 (!) 146/70  03/27/17 136/68  12/28/16 (!) 162/78     Renal dysfunction: She has had abnormal renal function but this has been relatively stable for several years   Lab Results  Component Value Date   CREATININE 1.43 (H) 03/27/2017    HYPERLIPIDEMIA: The lipid abnormality consists of elevated LDL and high triglycerides Her Triglycerides have been normal Current treatment regimen is pravastatin 80 mg and fenofibrate 160 mg Again no labs available from PCP   Lab Results  Component Value Date   CHOL 158 03/27/2017   HDL 45.90 03/27/2017   LDLCALC 73 03/27/2017   TRIG 197.0 (H) 03/27/2017   CHOLHDL 3 03/27/2017     Examination:   BP (!) 146/70 (BP Location: Left Arm)   Pulse 65   Ht 5\' 5"  (1.651 m)   Wt 166 lb 6.4 oz (75.5 kg)   SpO2 96%   BMI 27.69 kg/m   Body mass index is 27.69 kg/m.     ASSESSMENT/ PLAN:   Diabetes type 2  See history of present illness for detailed discussion of  current management, blood sugar patterns and problems identified  She has Overall good control with A1c 7.2 considering her age Still taking multiple diabetes medicines including low dose insulin at suppertime Although her  fasting blood sugars are fairly close to normal she has variable readings at night after dinner based on her intake Blood sugars are mostly high after eating cereal in the morning  Recommendations: Given her a Spread sheet to keep her insulin record and blood sugars for ensuring compliance Reduce evening dose to 7 units She can take her Victoza and insulin simultaneously She will add a protein to breakfast everyday Increase Prandin to 2 tablets at breakfast Written instructions given  HYPERTENSION: Blood pressure is high normal and she will continue to follow with cardiologist    Patient Instructions  Check blood sugars on waking up  2 or 3 times a week  Also check blood sugars about 2 hours after a meal and do this after different meals by rotation  Recommended blood sugar levels on waking up is 90-130 and about 2 hours after meal is 130-180  Please bring your blood sugar monitor to each visit, thank you  INSULIN doses: Take 7 units before supper  You can start taking the Victoza injection at suppertime also with the insulin  Make sure you have some cheese or boiled egg at breakfast for protein daily  REPAGLINIDE/Prandin: Take 2 tablets with the breakfast meal, one with lunch and 2 tablets at dinnertime    Vanessa Craig 06/26/2017, 1:06 PM

## 2017-06-26 NOTE — Patient Instructions (Addendum)
Check blood sugars on waking up  2 or 3 times a week  Also check blood sugars about 2 hours after a meal and do this after different meals by rotation  Recommended blood sugar levels on waking up is 90-130 and about 2 hours after meal is 130-180  Please bring your blood sugar monitor to each visit, thank you  INSULIN doses: Take 7 units before supper  You can start taking the Victoza injection at suppertime also with the insulin  Make sure you have some cheese or boiled egg at breakfast for protein daily  REPAGLINIDE/Prandin: Take 2 tablets with the breakfast meal, one with lunch and 2 tablets at dinnertime

## 2017-06-27 ENCOUNTER — Ambulatory Visit: Payer: Medicare Other | Admitting: Endocrinology

## 2017-07-23 DIAGNOSIS — I10 Essential (primary) hypertension: Secondary | ICD-10-CM | POA: Diagnosis not present

## 2017-07-23 DIAGNOSIS — R110 Nausea: Secondary | ICD-10-CM | POA: Diagnosis not present

## 2017-07-23 DIAGNOSIS — M25561 Pain in right knee: Secondary | ICD-10-CM | POA: Diagnosis not present

## 2017-07-23 DIAGNOSIS — G301 Alzheimer's disease with late onset: Secondary | ICD-10-CM | POA: Diagnosis not present

## 2017-07-31 ENCOUNTER — Other Ambulatory Visit: Payer: Self-pay | Admitting: Endocrinology

## 2017-08-11 ENCOUNTER — Other Ambulatory Visit: Payer: Self-pay | Admitting: Endocrinology

## 2017-09-19 DIAGNOSIS — S8991XA Unspecified injury of right lower leg, initial encounter: Secondary | ICD-10-CM | POA: Diagnosis not present

## 2017-09-19 DIAGNOSIS — M25561 Pain in right knee: Secondary | ICD-10-CM | POA: Diagnosis not present

## 2017-09-19 DIAGNOSIS — S8001XA Contusion of right knee, initial encounter: Secondary | ICD-10-CM | POA: Diagnosis not present

## 2017-09-19 DIAGNOSIS — G8911 Acute pain due to trauma: Secondary | ICD-10-CM | POA: Diagnosis not present

## 2017-09-19 DIAGNOSIS — E119 Type 2 diabetes mellitus without complications: Secondary | ICD-10-CM | POA: Diagnosis not present

## 2017-09-19 DIAGNOSIS — R51 Headache: Secondary | ICD-10-CM | POA: Diagnosis not present

## 2017-09-20 DIAGNOSIS — J40 Bronchitis, not specified as acute or chronic: Secondary | ICD-10-CM | POA: Diagnosis not present

## 2017-09-20 DIAGNOSIS — Z09 Encounter for follow-up examination after completed treatment for conditions other than malignant neoplasm: Secondary | ICD-10-CM | POA: Diagnosis not present

## 2017-09-20 DIAGNOSIS — R05 Cough: Secondary | ICD-10-CM | POA: Diagnosis not present

## 2017-09-20 DIAGNOSIS — R062 Wheezing: Secondary | ICD-10-CM | POA: Diagnosis not present

## 2017-09-25 ENCOUNTER — Ambulatory Visit (INDEPENDENT_AMBULATORY_CARE_PROVIDER_SITE_OTHER): Payer: Medicare Other | Admitting: Endocrinology

## 2017-09-25 ENCOUNTER — Encounter: Payer: Self-pay | Admitting: Endocrinology

## 2017-09-25 VITALS — BP 142/72 | HR 56 | Ht 65.0 in | Wt 166.4 lb

## 2017-09-25 DIAGNOSIS — E1165 Type 2 diabetes mellitus with hyperglycemia: Secondary | ICD-10-CM

## 2017-09-25 DIAGNOSIS — Z794 Long term (current) use of insulin: Secondary | ICD-10-CM | POA: Diagnosis not present

## 2017-09-25 LAB — LIPID PANEL
CHOL/HDL RATIO: 4
Cholesterol: 153 mg/dL (ref 0–200)
HDL: 37.3 mg/dL — ABNORMAL LOW (ref >39.00)
NONHDL: 115.79
Triglycerides: 268 mg/dL — ABNORMAL HIGH (ref 0.0–149.0)
VLDL: 53.6 mg/dL — AB (ref 0.0–40.0)

## 2017-09-25 LAB — BASIC METABOLIC PANEL
BUN: 34 mg/dL — AB (ref 6–23)
CO2: 31 mEq/L (ref 19–32)
Calcium: 9.6 mg/dL (ref 8.4–10.5)
Chloride: 103 mEq/L (ref 96–112)
Creatinine, Ser: 1.42 mg/dL — ABNORMAL HIGH (ref 0.40–1.20)
GFR: 37.12 mL/min — AB (ref >60.00)
GLUCOSE: 180 mg/dL — AB (ref 70–99)
POTASSIUM: 4.4 meq/L (ref 3.5–5.1)
Sodium: 139 mEq/L (ref 135–145)

## 2017-09-25 LAB — LDL CHOLESTEROL, DIRECT: LDL DIRECT: 85 mg/dL

## 2017-09-25 LAB — POCT GLYCOSYLATED HEMOGLOBIN (HGB A1C): HEMOGLOBIN A1C: 7.7

## 2017-09-25 NOTE — Progress Notes (Signed)
Patient ID: Vanessa Craig, female   DOB: [birth year], 83 y.o.   MRN: [id number]   Reason for Appointment: Diabetes follow-up   History of Present Illness   Diagnosis: Type 2 DIABETES MELITUS, date of diagnosis: 1995        She has had mild and usually well-controlled diabetes for several years Previously on metformin and this was stopped because of renal dysfunction Also has been tried on low dose basal insulin previously However with using GLP-1 drugs like Byetta and Victoza her blood sugars have been fairly well controlled She has been on Victoza since 2011   Because of significantly high readings including over 300 in October 2017 she was started on low-dose premixed insulin at suppertime  Recent history:  Non-insulin hypoglycemic drugs:  Actos 15 mg in a.m.Marland Kitchen  Prandin 2 mg before breakfast, 2 mg at lunch, 2 at supper.  Victoza 0.9 mg acs      INSULIN regimen: NovoLog 70/30, 8 units at suppertime  Her A1c appears to have gone up to 7.7, previously was 7.2%     Current management, blood sugar patterns and problems identified:  She is not sure of what insulin dose she is taking, was supposed to cut back to 7 units on the last visit  However she is trying to take her Victoza and insulin at the same time at suppertime for simplicity and better compliance  Also is unclear about how many tablets of Prandin she is taking, she is supposed to take 2 mg with every meal now  Although she has been told to add protein at breakfast also on the last visit she is mostly eating cereal although sometimes will have special K and today had Raisin bran  She has had respiratory illness last week and may have had higher sugars from this although mostly in the evening  Also blood sugar of 351 on Sunday may have been related to short course of steroids given by PCP  Blood sugars before and after dinnertime overall are extremely variable and not clear why  She maybe occasionally  forgetting to take her insulin or her oral medications  She was told to keep a record of her insulin administration on a log book but she forgot to do this and does not have the low sheet given  For various reasons and she has not been exercising as much as before, previously walking regularly   Hypoglycemia: None  Side effects from medications: None  Monitors blood glucose:  about once a day.    Glucometer: One Touch Verio      Blood Glucose readings   Mean values apply above for all meters except median for One Touch  PRE-MEAL Fasting Lunch Dinner Bedtime Overall  Glucose range: 115-170   85-314   85-3 51   Mean/median:   144   168   POST-MEAL PC Breakfast PC Lunch PC Dinner  Glucose range: 141, 298   123-351   Mean/median:   203     Previous blood sugars:  Mean values apply above for all meters except median for One Touch  PRE-MEAL Fasting Lunch Dinner Bedtime Overall  Glucose range:  179, 242      Mean/median: 108   115      POST-MEAL PC Breakfast PC Lunch PC Dinner  Glucose range:   163-209   Mean/median:   192     Meals: 3 meals per day. Dinner 6-7pm  Special K Cereal or raisin bran Sometimes with cheese  in am for breakfast or rarely eggs, variable carbohydrate intake at other meals         Physical activity: exercise: Walking 45 min 3-5 days a week at gym, irregular  Dietician visit: Most recent: Years ago          Wt Readings from Last 3 Encounters:  09/25/17 166 lb 6.4 oz (75.5 kg)  06/26/17 166 lb 6.4 oz (75.5 kg)  03/27/17 165 lb 6.4 oz (75 kg)    Lab Results  Component Value Date   HGBA1C 7.7 09/25/2017   HGBA1C 7.2 06/26/2017   HGBA1C 7.2 03/27/2017   Lab Results  Component Value Date   MICROALBUR 11.9 (H) 12/28/2016   LDLCALC 73 03/27/2017   CREATININE 1.43 (H) 03/27/2017     OTHER active problems: See review of systems   Office Visit on 09/25/2017  Component Date Value Ref Range Status  . Hemoglobin A1C 09/25/2017 7.7   Final     Allergies as of 09/25/2017      Reactions   Codeine Nausea And Vomiting      Medication List       Accurate as of 09/25/17  3:57 PM. Always use your most recent med list.          amLODipine 5 MG tablet Commonly known as:  NORVASC TAKE 1 TABLET (5 MG TOTAL) BY MOUTH DAILY.   aspirin 81 MG tablet Take 81 mg by mouth at bedtime.   B-D ULTRAFINE III SHORT PEN 31G X 8 MM Misc Generic drug:  Insulin Pen Needle Use 2 daily   carvedilol 6.25 MG tablet Commonly known as:  COREG Take 6.25 mg by mouth 2 (two) times daily with a meal.   donepezil 5 MG tablet Commonly known as:  ARICEPT Take 5 mg by mouth at bedtime.   fenofibrate 160 MG tablet TAKE 1 TABLET BY MOUTH EVERY DAY   Fish Oil 1000 MG Caps Take 1,000 mg by mouth 2 (two) times daily.   glucose blood test strip Commonly known as:  ONE TOUCH ULTRA TEST Use to test blood sugar once daily as instructed. Dx code: E11.65   glucose blood test strip Please Dispense verio test strips Pt is to use twice a day.   guanFACINE 2 MG tablet Commonly known as:  TENEX Take 2 mg by mouth at bedtime.   hydrochlorothiazide 12.5 MG capsule Commonly known as:  MICROZIDE TAKE ONE CAPSULE BY MOUTH DAILY   magnesium oxide 400 MG tablet Commonly known as:  MAG-OX Take 400 mg by mouth daily.   meclizine 12.5 MG tablet Commonly known as:  ANTIVERT   multivitamin with minerals Tabs tablet Take 1 tablet by mouth daily.   NEXIUM 40 MG capsule Generic drug:  esomeprazole Take 40 mg by mouth daily.   NOVOLOG MIX 70/30 FLEXPEN (70-30) 100 UNIT/ML FlexPen Generic drug:  insulin aspart protamine - aspart INJECT 0.08 MLS (8 UNITS TOTAL) INTO THE SKIN DAILY BEFORE SUPPER.   pioglitazone 15 MG tablet Commonly known as:  ACTOS TAKE 1 TABLET (15 MG TOTAL) BY MOUTH DAILY.   pravastatin 80 MG tablet Commonly known as:  PRAVACHOL Take 80 mg by mouth daily.   raloxifene 60 MG tablet Commonly known as:  EVISTA TAKE 1 TABLET BY  MOUTH EVERY DAY   ramipril 10 MG capsule Commonly known as:  ALTACE Take 10 mg by mouth daily.   ranitidine 150 MG tablet Commonly known as:  ZANTAC   repaglinide 1 MG tablet Commonly known as:  PRANDIN TAKE 1 TABLET AT BREAKFAST AND LUNCH AND 2-4 TABLETS AT SUPPER DEPENDING ON MEAL SIZE.   traZODone 50 MG tablet Commonly known as:  DESYREL   VICTOZA 18 MG/3ML Sopn Generic drug:  liraglutide INJECT 1.2 MG INTO THE SKIN DAILY.   vitamin B-12 1000 MCG tablet Commonly known as:  CYANOCOBALAMIN Take 1,000 mcg by mouth daily.   Vitamin D3 3000 units Tabs Take 1 capsule by mouth at bedtime.       Allergies:  Allergies  Allergen Reactions  . Codeine Nausea And Vomiting    Past Medical History:  Diagnosis Date  . Diabetes mellitus without complication ([place])   . Hypertension     Past Surgical History:  Procedure Laterality Date  . APPENDECTOMY      Family History  Problem Relation Age of Onset  . Heart disease Father     Social History:  reports that she has never smoked. She has never used smokeless tobacco. She reports that she does not drink alcohol. Her drug history is not on file.  Review of Systems:  He has mild dementia and is followed by PCP     Osteopenia: She is on Evista from her PCP   HYPERTENSION:  she has had long-standing hypertension treated with multiple drugs. Followed by cardiologist and PCP  Her daughter will occasionally check this at home  BP Readings from Last 3 Encounters:  09/25/17 (!) 142/72  06/26/17 (!) 146/70  03/27/17 136/68     Renal dysfunction: She has had abnormal renal function but this has been relatively stable for several years   Lab Results  Component Value Date   CREATININE 1.43 (H) 03/27/2017    HYPERLIPIDEMIA: The lipid abnormality consists of elevated LDL and high triglycerides Her Triglycerides have been Variable  Current treatment regimen is pravastatin 80 mg and fenofibrate 160 mg daily   Lab  Results  Component Value Date   CHOL 158 03/27/2017   HDL 45.90 03/27/2017   LDLCALC 73 03/27/2017   TRIG 197.0 (H) 03/27/2017   CHOLHDL 3 03/27/2017     Examination:   BP (!) 142/72   Pulse (!) 56   Ht 5\' 5"  (1.651 m)   Wt 166 lb 6.4 oz (75.5 kg)   SpO2 98%   BMI 27.69 kg/m   Body mass index is 27.69 kg/m.     ASSESSMENT/ PLAN:   Diabetes type 2  See history of present illness for detailed discussion of  current management, blood sugar patterns and problems identified  She has somewhat worsening of her control with A1c 7.7, previously 7.2 December be partly related to intercurrent illnesses, difficulty with consistent compliance with her medication regimen, variable diet However considering her age blood sugars are reasonably controlled overall She does not check blood sugars at variable times and mostly in the evening before and after supper However fasting readings seem to be reasonably good and as low as 115  Recommendations: Given her a flowsheet for  Keeping record of her insulin doses and blood sugar she wants to, this may help with compliance She will continue the same dose of insulin  Increase Prandin to 2 tablets at all meals Written instructions given  HYPERTENSION: Blood pressure is normal and she will continue to follow with PCP  LIPIDS: Labs to be checked today although she is nonfasting. Because of her renal dysfunction will have her take fenofibrate every other day    Patient Instructions  Check off when taking shots  2 Prandin  before each meal, next Rx will be 2mg   Fenofibrate every 2 days..  Check blood sugars on waking up  4/7 days  Also check blood sugars about 2 hours after a meal and do this after different meals by rotation  Recommended blood sugar levels on waking up is 90-130 and about 2 hours after meal is 130-160  Please bring your blood sugar monitor to each visit, thank you     [facility] 09/25/2017, 3:57 PM

## 2017-09-25 NOTE — Patient Instructions (Addendum)
Check off when taking shots  2 Prandin before each meal, next Rx will be 2mg   Fenofibrate every 2 days..  Check blood sugars on waking up  4/7 days  Also check blood sugars about 2 hours after a meal and do this after different meals by rotation  Recommended blood sugar levels on waking up is 90-130 and about 2 hours after meal is 130-160  Please bring your blood sugar monitor to each visit, thank you

## 2017-09-28 NOTE — Progress Notes (Signed)
Please call to let patient know that the lab results are good and no further action needed

## 2017-10-24 DIAGNOSIS — F0390 Unspecified dementia without behavioral disturbance: Secondary | ICD-10-CM | POA: Diagnosis not present

## 2017-10-24 DIAGNOSIS — I10 Essential (primary) hypertension: Secondary | ICD-10-CM | POA: Diagnosis not present

## 2017-10-24 DIAGNOSIS — E119 Type 2 diabetes mellitus without complications: Secondary | ICD-10-CM | POA: Diagnosis not present

## 2017-11-06 ENCOUNTER — Other Ambulatory Visit: Payer: Self-pay | Admitting: Endocrinology

## 2017-11-13 DIAGNOSIS — I10 Essential (primary) hypertension: Secondary | ICD-10-CM | POA: Diagnosis not present

## 2017-11-13 DIAGNOSIS — E785 Hyperlipidemia, unspecified: Secondary | ICD-10-CM | POA: Diagnosis not present

## 2017-11-13 DIAGNOSIS — Z79899 Other long term (current) drug therapy: Secondary | ICD-10-CM | POA: Diagnosis not present

## 2017-11-13 DIAGNOSIS — E119 Type 2 diabetes mellitus without complications: Secondary | ICD-10-CM | POA: Diagnosis not present

## 2017-11-13 DIAGNOSIS — K219 Gastro-esophageal reflux disease without esophagitis: Secondary | ICD-10-CM | POA: Diagnosis not present

## 2017-11-21 ENCOUNTER — Other Ambulatory Visit: Payer: Self-pay

## 2017-11-21 MED ORDER — PIOGLITAZONE HCL 15 MG PO TABS: ORAL_TABLET | ORAL | 1 refills | Status: DC

## 2017-12-04 ENCOUNTER — Other Ambulatory Visit: Payer: Self-pay

## 2017-12-04 MED ORDER — HYDROCHLOROTHIAZIDE 12.5 MG PO CAPS: 12.5000 mg | ORAL_CAPSULE | Freq: Every day | ORAL | 3 refills | Status: DC

## 2017-12-15 ENCOUNTER — Other Ambulatory Visit: Payer: Self-pay | Admitting: Endocrinology

## 2017-12-26 ENCOUNTER — Ambulatory Visit: Payer: Medicare Other | Admitting: Endocrinology

## 2018-01-17 ENCOUNTER — Other Ambulatory Visit: Payer: Self-pay

## 2018-01-17 MED ORDER — REPAGLINIDE 1 MG PO TABS: ORAL_TABLET | ORAL | 0 refills | Status: DC

## 2018-01-18 ENCOUNTER — Telehealth: Payer: Self-pay | Admitting: Endocrinology

## 2018-01-18 ENCOUNTER — Ambulatory Visit (INDEPENDENT_AMBULATORY_CARE_PROVIDER_SITE_OTHER): Payer: Medicare Other | Admitting: Endocrinology

## 2018-01-18 ENCOUNTER — Encounter: Payer: Self-pay | Admitting: Endocrinology

## 2018-01-18 VITALS — BP 151/94 | HR 78 | Ht 65.0 in | Wt 171.4 lb

## 2018-01-18 DIAGNOSIS — E1165 Type 2 diabetes mellitus with hyperglycemia: Secondary | ICD-10-CM | POA: Diagnosis not present

## 2018-01-18 DIAGNOSIS — E118 Type 2 diabetes mellitus with unspecified complications: Secondary | ICD-10-CM

## 2018-01-18 DIAGNOSIS — Z794 Long term (current) use of insulin: Secondary | ICD-10-CM

## 2018-01-18 DIAGNOSIS — E559 Vitamin D deficiency, unspecified: Secondary | ICD-10-CM | POA: Diagnosis not present

## 2018-01-18 LAB — COMPREHENSIVE METABOLIC PANEL
ALBUMIN: 3.6 g/dL (ref 3.5–5.2)
ALT: 12 U/L (ref 0–35)
AST: 15 U/L (ref 0–37)
Alkaline Phosphatase: 48 U/L (ref 39–117)
BILIRUBIN TOTAL: 0.6 mg/dL (ref 0.2–1.2)
BUN: 22 mg/dL (ref 6–23)
CALCIUM: 9.3 mg/dL (ref 8.4–10.5)
CO2: 27 mEq/L (ref 19–32)
CREATININE: 1.5 mg/dL — AB (ref 0.40–1.20)
Chloride: 102 mEq/L (ref 96–112)
GFR: 34.82 mL/min — ABNORMAL LOW (ref >60.00)
Glucose, Bld: 246 mg/dL — ABNORMAL HIGH (ref 70–99)
Potassium: 4.2 mEq/L (ref 3.5–5.1)
Sodium: 138 mEq/L (ref 135–145)
TOTAL PROTEIN: 6.4 g/dL (ref 6.0–8.3)

## 2018-01-18 LAB — POCT GLYCOSYLATED HEMOGLOBIN (HGB A1C): Hemoglobin A1C: 9.1

## 2018-01-18 LAB — VITAMIN D 25 HYDROXY (VIT D DEFICIENCY, FRACTURES): VITD: 30.32 ng/mL (ref 30.00–100.00)

## 2018-01-18 LAB — MICROALBUMIN / CREATININE URINE RATIO
Creatinine,U: 225.1 mg/dL
Microalb Creat Ratio: 3.7 mg/g (ref 0.0–30.0)
Microalb, Ur: 8.4 mg/dL — ABNORMAL HIGH (ref 0.0–1.9)

## 2018-01-18 MED ORDER — REPAGLINIDE 1 MG PO TABS: ORAL_TABLET | ORAL | 3 refills | Status: DC

## 2018-01-18 MED ORDER — AMLODIPINE BESYLATE 10 MG PO TABS: 10.0000 mg | ORAL_TABLET | Freq: Every day | ORAL | 3 refills | Status: DC

## 2018-01-18 NOTE — Telephone Encounter (Signed)
See message and please advise.  

## 2018-01-18 NOTE — Progress Notes (Signed)
Patient ID: Vanessa Craig, female   DOB: Dec 21, 1927, 84 y.o.   MRN: [id number]   Reason for Appointment: Diabetes follow-up   History of Present Illness   Diagnosis: Type 2 DIABETES MELITUS, date of diagnosis: 1995        She has had mild and usually well-controlled diabetes for several years Previously on metformin and this was stopped because of renal dysfunction Also has been tried on low dose basal insulin previously However with using GLP-1 drugs like Byetta and Victoza her blood sugars have been fairly well controlled She has been on Victoza since 2011   Because of significantly high readings including over 300 in October 2017 she was started on low-dose premixed insulin at suppertime  Recent history:  Non-insulin hypoglycemic drugs:  Actos 15 mg in a.m.Marland Kitchen  Prandin 2 mg before breakfast, 2 mg at lunch, 2 at supper.  Victoza 0.6 mg acs      INSULIN regimen: NovoLog 70/30, 8 units at suppertime  Her A1c appears to have gone up significantly unknown 9.1, has been as low as 7.2    Current management, blood sugar patterns and problems identified:  She is still not keeping a record of her insulin doses to make sure she is taking it consistently every day  However she thinks she is taking both of Victoza and insulin at the same time  Not clear why her blood sugars are overall so much higher at night and more consistently than the last time  Her daughter says that she is not having as much of an appetite although has gained weight  She is only taking 0.6 mg Victoza instead of 0.9  Also she was told to increase her Prandin up to 2 tablets at lunch but she is still taking only 1 tablet as on the old prescription  Highest blood sugar is 308 after supper  She has only a couple of readings in the mornings and these appear to be high also  Lately has not been walking which she normally likes to do and this may be causing some weight gain   Hypoglycemia: None  Side  effects from medications: None  Monitors blood glucose:  about once a day or less .    Glucometer: One Touch Verio      Blood Glucose readings   Mean values apply above for all meters except median for One Touch  PRE-MEAL Fasting Lunch Dinner Bedtime Overall  Glucose range: 163, 181   ?  253     Mean/median:     227+/-64    POST-MEAL PC Breakfast PC Lunch PC Dinner  Glucose range: 93, 117  265  181-308   Mean/median:   232    Previous blood sugars:  Mean values apply above for all meters except median for One Touch  PRE-MEAL Fasting Lunch Dinner Bedtime Overall  Glucose range: 115-170   85-314   85-3 51   Mean/median:   144   168   POST-MEAL PC Breakfast PC Lunch PC Dinner  Glucose range: 141, 298   123-351   Mean/median:   203     Meals: 3 meals per day. Dinner 6-7pm  Special K Cereal or raisin bran Sometimes with cheese in am for breakfast or rarely eggs, variable carbohydrate intake at other meals         Physical activity: exercise: not Walking currently   Dietician visit: Most recent: Years ago          [employer]  from Last 3 Encounters:  01/18/18 171 lb 6.4 oz (77.7 kg)  09/25/17 166 lb 6.4 oz (75.5 kg)  06/26/17 166 lb 6.4 oz (75.5 kg)    Lab Results  Component Value Date   HGBA1C 9.1 01/18/2018   HGBA1C 7.7 09/25/2017   HGBA1C 7.2 06/26/2017   Lab Results  Component Value Date   MICROALBUR 8.4 (H) 01/18/2018   LDLCALC 73 03/27/2017   CREATININE 1.50 (H) 01/18/2018     OTHER active problems: See review of systems   Office Visit on 01/18/2018  Component Date Value Ref Range Status  . Hemoglobin A1C 01/18/2018 9.1   Final  . VITD 01/18/2018 30.32  30.00 - 100.00 ng/mL Final  . Microalb, Ur 01/18/2018 8.4* 0.0 - 1.9 mg/dL Final  . Creatinine,U 01/18/2018 225.1  mg/dL Final  . Microalb Creat Ratio 01/18/2018 3.7  0.0 - 30.0 mg/g Final  . Sodium 01/18/2018 138  135 - 145 mEq/L Final  . Potassium 01/18/2018 4.2  3.5 - 5.1 mEq/L Final  . Chloride  01/18/2018 102  96 - 112 mEq/L Final  . CO2 01/18/2018 27  19 - 32 mEq/L Final  . Glucose, Bld 01/18/2018 246* 70 - 99 mg/dL Final  . BUN 01/18/2018 22  6 - 23 mg/dL Final  . Creatinine, Ser 01/18/2018 1.50* 0.40 - 1.20 mg/dL Final  . Total Bilirubin 01/18/2018 0.6  0.2 - 1.2 mg/dL Final  . Alkaline Phosphatase 01/18/2018 48  39 - 117 U/L Final  . AST 01/18/2018 15  0 - 37 U/L Final  . ALT 01/18/2018 12  0 - 35 U/L Final  . Total Protein 01/18/2018 6.4  6.0 - 8.3 g/dL Final  . Albumin 01/18/2018 3.6  3.5 - 5.2 g/dL Final  . Calcium 01/18/2018 9.3  8.4 - 10.5 mg/dL Final  . GFR 01/18/2018 34.82* >60.00 mL/min Final    Allergies as of 01/18/2018      Reactions   Codeine Nausea And Vomiting      Medication List        Accurate as of 01/18/18 12:25 PM. Always use your most recent med list.          amLODipine 5 MG tablet Commonly known as:  NORVASC TAKE 1 TABLET (5 MG TOTAL) BY MOUTH DAILY.   aspirin 81 MG tablet Take 81 mg by mouth at bedtime.   B-D ULTRAFINE III SHORT PEN 31G X 8 MM Misc Generic drug:  Insulin Pen Needle Use 2 daily   carvedilol 6.25 MG tablet Commonly known as:  COREG Take 6.25 mg by mouth 2 (two) times daily with a meal.   donepezil 5 MG tablet Commonly known as:  ARICEPT Take 5 mg by mouth at bedtime.   fenofibrate 160 MG tablet TAKE 1 TABLET BY MOUTH EVERY DAY   Fish Oil 1000 MG Caps Take 1,000 mg by mouth 2 (two) times daily.   glucose blood test strip Commonly known as:  ONE TOUCH ULTRA TEST Use to test blood sugar once daily as instructed. Dx code: E11.65   guanFACINE 2 MG tablet Commonly known as:  TENEX Take 2 mg by mouth at bedtime.   hydrochlorothiazide 12.5 MG capsule Commonly known as:  MICROZIDE Take 1 capsule (12.5 mg total) by mouth daily.   magnesium oxide 400 MG tablet Commonly known as:  MAG-OX Take 400 mg by mouth daily.   meclizine 12.5 MG tablet Commonly known as:  ANTIVERT   multivitamin with minerals Tabs  tablet Take 1 tablet  by mouth daily.   NOVOLOG MIX 70/30 FLEXPEN (70-30) 100 UNIT/ML FlexPen Generic drug:  insulin aspart protamine - aspart INJECT 0.08 MLS (8 UNITS TOTAL) INTO THE SKIN DAILY BEFORE SUPPER.   pioglitazone 15 MG tablet Commonly known as:  ACTOS TAKE 1 TABLET (15 MG TOTAL) BY MOUTH DAILY.   pravastatin 80 MG tablet Commonly known as:  PRAVACHOL Take 80 mg by mouth daily.   raloxifene 60 MG tablet Commonly known as:  EVISTA TAKE 1 TABLET BY MOUTH EVERY DAY   ramipril 10 MG capsule Commonly known as:  ALTACE Take 10 mg by mouth daily.   ranitidine 150 MG tablet Commonly known as:  ZANTAC   repaglinide 1 MG tablet Commonly known as:  PRANDIN TAKE 1 TABLET AT BREAKFAST AND LUNCH AND 2-4 TABLETS AT SUPPER DEPENDING ON MEAL SIZE.   rivastigmine 4.6 mg/24hr Commonly known as:  EXELON Place 4.6 mg onto the skin daily.   traZODone 50 MG tablet Commonly known as:  DESYREL   VICTOZA 18 MG/3ML Sopn Generic drug:  liraglutide INJECT 1.2 MG INTO THE SKIN DAILY.   vitamin B-12 1000 MCG tablet Commonly known as:  CYANOCOBALAMIN Take 1,000 mcg by mouth daily.   Vitamin D3 3000 units Tabs Take 1 capsule by mouth at bedtime.       Allergies:  Allergies  Allergen Reactions  . Codeine Nausea And Vomiting    Past Medical History:  Diagnosis Date  . Diabetes mellitus without complication ([place])   . Hypertension     Past Surgical History:  Procedure Laterality Date  . APPENDECTOMY      Family History  Problem Relation Age of Onset  . Heart disease Father     Social History:  reports that  has never smoked. she has never used smokeless tobacco. She reports that she does not drink alcohol. Her drug history is not on file.  Review of Systems:  He has mild dementia and still having issues with memory     Osteopenia: She is on Evista from her PCP   HYPERTENSION:  she has had long-standing hypertension treated with multiple drugs.  Her PCP did not  change her medications even though blood pressure appears to be consistently high Her daughter will occasionally check this at home but does not remember the numbers, she thinks it is high also Has only occasional left ankle edema  BP Readings from Last 3 Encounters:  01/18/18 (!) 151/94  09/25/17 (!) 142/72  06/26/17 (!) 146/70     Renal dysfunction: She has had abnormal renal function but this has been relatively stable for several years   Lab Results  Component Value Date   CREATININE 1.50 (H) 01/18/2018    HYPERLIPIDEMIA: The lipid abnormality consists of elevated LDL and high triglycerides  Her Triglycerides have been higher more recently She does not think she is taking her fish oil as directed  Otherwise taking pravastatin 80 mg and fenofibrate 160 mg daily   Lab Results  Component Value Date   CHOL 153 09/25/2017   HDL 37.30 (L) 09/25/2017   LDLCALC 73 03/27/2017   LDLDIRECT 85.0 09/25/2017   TRIG 268.0 (H) 09/25/2017   CHOLHDL 4 09/25/2017     Examination:   BP (!) 151/94   Pulse 78   Ht 5\' 5"  (1.651 m)   Wt 171 lb 6.4 oz (77.7 kg)   BMI 28.52 kg/m   Body mass index is 28.52 kg/m.   No significant edema present except slightly on the  left ankle  .  Blood pressure 160/90  Diabetic Foot Exam - Simple   Simple Foot Form Diabetic Foot exam was performed with the following findings:  Yes 01/18/2018  9:49 AM  Visual Inspection No deformities, no ulcerations, no other skin breakdown bilaterally:  Yes Sensation Testing Intact to touch and monofilament testing bilaterally:  Yes Pulse Check Posterior Tibialis and Dorsalis pulse intact bilaterally:  Yes Comments      ASSESSMENT/ PLAN:   Diabetes type 2 long-standing  See history of present illness for detailed discussion of  current management, blood sugar patterns and problems identified  She is on multiple medications including low dose insulin However is having significant postprandial  hyperglycemia  Difficult to manage her diabetes because of her dementia and not sure if she is completely compliant with all her injections and other medications However her A1c has gone up evidently now to 9.2 which is higher than usual Most of her sugars are significantly high after dinner and is more consistent now Fasting readings appear to be high also She must be more insulin deficient given her long duration of diabetes  With at least increase her insulin to 10 units at suppertime Try another extra 5 clicks beyond 0.6 on the Victoza Increase Prandin to 2 tablets at lunch Will need to make sure she is checking sugars at various times and this was discussed Written instructions reviewed with her and her daughter and will need to follow-up short-term in 6 weeks now   HYPERTENSION: Blood pressure is appearing to be consistently higher without any issues with compliance with medications or fluid overload  For now she will increase her Norvasc to 10 mg and will follow-up on her diabetes visit next  Lipids: She will start back on fish oil OTC at least  Counseling time on subjects discussed in assessment and plan sections is over 50% of today's 25 minute visit    Patient Instructions  Check blood sugars on waking up  3/7 days  Also check blood sugars about 2 hours after a meal  Please check some readings after breakfast and lunch also  Recommended blood sugar levels on waking up is 90-130 and about 2 hours after meal is 130-160  Please bring your blood sugar monitor to each visit, thank you  REPAGLINIDE tablets: Take 1 before breakfast and 2 before lunch and dinner  VICTOZA: Dial 0.6 and then 5 more clicks  Increase INSULIN to 10 units before supper time  Call if blood sugars are consistently over 200  Try to resume walking  2 amlodipine    Elayne Snare 01/18/2018, 12:25 PM

## 2018-01-18 NOTE — Telephone Encounter (Signed)
pts daughter stated that the medication list she has some medications that they notice she has not been taking and does not have any of. And they are wondering if she is suppose to be taking those medications.    fenofibrate 160 MG tablet  carvedilol (COREG) 6.25 MG tablet  donepezil (ARICEPT) 5 MG tablet   Please advise

## 2018-01-18 NOTE — Patient Instructions (Addendum)
Check blood sugars on waking up  3/7 days  Also check blood sugars about 2 hours after a meal  Please check some readings after breakfast and lunch also  Recommended blood sugar levels on waking up is 90-130 and about 2 hours after meal is 130-160  Please bring your blood sugar monitor to each visit, thank you  REPAGLINIDE tablets: Take 1 before breakfast and 2 before lunch and dinner  VICTOZA: Dial 0.6 and then 5 more clicks  Increase INSULIN to 10 units before supper time  Call if blood sugars are consistently over 200  Try to resume walking  2 amlodipine

## 2018-01-21 NOTE — Telephone Encounter (Signed)
P/t's daughter informed

## 2018-01-21 NOTE — Telephone Encounter (Signed)
These medications need to be clarified with her PCP

## 2018-01-21 NOTE — Telephone Encounter (Signed)
Left mess for patient's daughter to call back.

## 2018-01-31 ENCOUNTER — Other Ambulatory Visit: Payer: Self-pay

## 2018-01-31 ENCOUNTER — Telehealth: Payer: Self-pay | Admitting: Endocrinology

## 2018-01-31 MED ORDER — INSULIN ASPART PROT & ASPART (70-30 MIX) 100 UNIT/ML PEN: 8.0000 [IU] | PEN_INJECTOR | Freq: Every day | SUBCUTANEOUS | 2 refills | Status: DC

## 2018-01-31 NOTE — Telephone Encounter (Signed)
Done

## 2018-01-31 NOTE — Telephone Encounter (Signed)
Need refill  Original Order:  insulin aspart protamine - aspart (NOVOLOG MIX 70/30 FLEXPEN) (70-30) 100 UNIT/ML FlexPen [[id number]]    Pharmacy:  [id number] - [place], [place] - [address] DEA #:  [id number]

## 2018-02-15 ENCOUNTER — Other Ambulatory Visit: Payer: Self-pay

## 2018-02-15 MED ORDER — INSULIN ASPART PROT & ASPART (70-30 MIX) 100 UNIT/ML PEN: 8.0000 [IU] | PEN_INJECTOR | Freq: Every day | SUBCUTANEOUS | 2 refills | Status: DC

## 2018-02-26 ENCOUNTER — Other Ambulatory Visit: Payer: Self-pay | Admitting: Endocrinology

## 2018-03-01 ENCOUNTER — Encounter: Payer: Self-pay | Admitting: Endocrinology

## 2018-03-01 ENCOUNTER — Ambulatory Visit (INDEPENDENT_AMBULATORY_CARE_PROVIDER_SITE_OTHER): Payer: Medicare Other | Admitting: Endocrinology

## 2018-03-01 VITALS — BP 130/76 | HR 83 | Ht 65.0 in | Wt 169.0 lb

## 2018-03-01 DIAGNOSIS — E1165 Type 2 diabetes mellitus with hyperglycemia: Secondary | ICD-10-CM | POA: Diagnosis not present

## 2018-03-01 DIAGNOSIS — Z794 Long term (current) use of insulin: Secondary | ICD-10-CM | POA: Diagnosis not present

## 2018-03-01 NOTE — Patient Instructions (Signed)
Stop Victoza  INSULIN 8 UNITS BEFORE Breakfast and supper

## 2018-03-01 NOTE — Progress Notes (Signed)
Patient ID: Vanessa Craig, female   DOB: [birth year], 84 y.o.   MRN: [id number]   Reason for Appointment: Diabetes follow-up   History of Present Illness   Diagnosis: Type 2 DIABETES MELITUS, date of diagnosis: 1995        She has had mild and usually well-controlled diabetes for several years Previously on metformin and this was stopped because of renal dysfunction Also has been tried on low dose basal insulin previously However with using GLP-1 drugs like Byetta and Victoza her blood sugars have been fairly well controlled She has been on Victoza since 2011   Because of significantly high readings including over 300 in October 2017 she was started on low-dose premixed insulin at suppertime  Recent history:  Non-insulin hypoglycemic drugs:  Actos 15 mg in a.m.Marland Kitchen  Prandin 2 mg before breakfast, 2 mg at lunch, 2 at supper.  Victoza 0.6 mg acs      INSULIN regimen: NovoLog 70/30, 8 units at suppertime  Her A1c appears to have gone up significantly unknown 9.1, has been as low as 7.2    Current management, blood sugar patterns and problems identified:  He was told to increase her insulin to 10 units on the last visit but she has not done so  Also she was told to dial additional 5 clicks on her Victoza pen but she is still taking 0.6 mg only  Last several days she been having some malaise and headache and also according to her daughter not eating as well  She has done more glucose monitoring around lunchtime with the help of her daughter than before  However now appears that her blood sugars are even higher at lunchtime  On an average blood sugars are still over 200 at suppertime but variable, she may or may not eat much carbohydrate at lunchtime especially recently  Has a few readings in the mornings are also moderately increased but not consistent  Her weight gain has leveled off  However she is still not doing much walking with not feeling well  She did have a  falsely low reading on her meter and also recently she thinks it has not been working right   Hypoglycemia: None  Side effects from medications: None  Monitors blood glucose:  about once a day or less .    Glucometer: One Touch Verio      Blood Glucose readings   Mean values apply above for all meters except median for One Touch  PRE-MEAL Fasting Lunch Dinner Bedtime Overall  Glucose range:  166-201  224-303  150-342    Mean/median:  175  250  241  230    Meals: 3 meals per day. Dinner 6-7pm  Special K Cereal or raisin bran Sometimes with cheese in am for breakfast or rarely eggs, variable carbohydrate intake at other meals         Physical activity: exercise: None recently    Dietician visit: Most recent: Years ago          Wt Readings from Last 3 Encounters:  03/01/18 169 lb (76.7 kg)  01/18/18 171 lb 6.4 oz (77.7 kg)  09/25/17 166 lb 6.4 oz (75.5 kg)    Lab Results  Component Value Date   HGBA1C 9.1 01/18/2018   HGBA1C 7.7 09/25/2017   HGBA1C 7.2 06/26/2017   Lab Results  Component Value Date   MICROALBUR 8.4 (H) 01/18/2018   LDLCALC 73 03/27/2017   CREATININE 1.50 (H) 01/18/2018  OTHER active problems: See review of systems   No visits with results within 1 Week(s) from this visit.  Latest known visit with results is:  Office Visit on 01/18/2018  Component Date Value Ref Range Status  . Hemoglobin A1C 01/18/2018 9.1   Final  . VITD 01/18/2018 30.32  30.00 - 100.00 ng/mL Final  . Microalb, Ur 01/18/2018 8.4* 0.0 - 1.9 mg/dL Final  . Creatinine,U 01/18/2018 225.1  mg/dL Final  . Microalb Creat Ratio 01/18/2018 3.7  0.0 - 30.0 mg/g Final  . Sodium 01/18/2018 138  135 - 145 mEq/L Final  . Potassium 01/18/2018 4.2  3.5 - 5.1 mEq/L Final  . Chloride 01/18/2018 102  96 - 112 mEq/L Final  . CO2 01/18/2018 27  19 - 32 mEq/L Final  . Glucose, Bld 01/18/2018 246* 70 - 99 mg/dL Final  . BUN 01/18/2018 22  6 - 23 mg/dL Final  . Creatinine, Ser 01/18/2018 1.50*  0.40 - 1.20 mg/dL Final  . Total Bilirubin 01/18/2018 0.6  0.2 - 1.2 mg/dL Final  . Alkaline Phosphatase 01/18/2018 48  39 - 117 U/L Final  . AST 01/18/2018 15  0 - 37 U/L Final  . ALT 01/18/2018 12  0 - 35 U/L Final  . Total Protein 01/18/2018 6.4  6.0 - 8.3 g/dL Final  . Albumin 01/18/2018 3.6  3.5 - 5.2 g/dL Final  . Calcium 01/18/2018 9.3  8.4 - 10.5 mg/dL Final  . GFR 01/18/2018 34.82* >60.00 mL/min Final    Allergies as of 03/01/2018      Reactions   Codeine Nausea And Vomiting      Medication List        Accurate as of 03/01/18 10:12 AM. Always use your most recent med list.          amLODipine 10 MG tablet Commonly known as:  NORVASC Take 1 tablet (10 mg total) by mouth daily.   aspirin 81 MG tablet Take 81 mg by mouth at bedtime.   B-D ULTRAFINE III SHORT PEN 31G X 8 MM Misc Generic drug:  Insulin Pen Needle Use 2 daily   carvedilol 6.25 MG tablet Commonly known as:  COREG Take 6.25 mg by mouth 2 (two) times daily with a meal.   donepezil 5 MG tablet Commonly known as:  ARICEPT Take 5 mg by mouth at bedtime.   Fish Oil 1000 MG Caps Take 1,000 mg by mouth 2 (two) times daily.   glucose blood test strip Commonly known as:  ONE TOUCH ULTRA TEST Use to test blood sugar once daily as instructed. Dx code: E11.65   guanFACINE 2 MG tablet Commonly known as:  TENEX Take 2 mg by mouth at bedtime.   hydrochlorothiazide 12.5 MG capsule Commonly known as:  MICROZIDE Take 1 capsule (12.5 mg total) by mouth daily.   insulin aspart protamine - aspart (70-30) 100 UNIT/ML FlexPen Commonly known as:  NOVOLOG MIX 70/30 FLEXPEN Inject 0.08 mLs (8 Units total) into the skin daily before supper.   magnesium oxide 400 MG tablet Commonly known as:  MAG-OX Take 400 mg by mouth daily.   meclizine 12.5 MG tablet Commonly known as:  ANTIVERT   multivitamin with minerals Tabs tablet Take 1 tablet by mouth daily.   pioglitazone 15 MG tablet Commonly known as:   ACTOS TAKE 1 TABLET (15 MG TOTAL) BY MOUTH DAILY.   pravastatin 80 MG tablet Commonly known as:  PRAVACHOL Take 80 mg by mouth daily.   raloxifene 60 MG  tablet Commonly known as:  EVISTA TAKE 1 TABLET BY MOUTH EVERY DAY   ramipril 10 MG capsule Commonly known as:  ALTACE Take 10 mg by mouth daily.   ranitidine 150 MG tablet Commonly known as:  ZANTAC   repaglinide 1 MG tablet Commonly known as:  PRANDIN 1 tablet before breakfast, 2 tablets before lunch and dinner   rivastigmine 4.6 mg/24hr Commonly known as:  EXELON Place 4.6 mg onto the skin daily.   traZODone 50 MG tablet Commonly known as:  DESYREL   VICTOZA 18 MG/3ML Sopn Generic drug:  liraglutide INJECT 1.2 MG INTO THE SKIN DAILY.   vitamin B-12 1000 MCG tablet Commonly known as:  CYANOCOBALAMIN Take 1,000 mcg by mouth daily.   Vitamin D3 3000 units Tabs Take 1 capsule by mouth at bedtime.       Allergies:  Allergies  Allergen Reactions  . Codeine Nausea And Vomiting    Past Medical History:  Diagnosis Date  . Diabetes mellitus without complication ([place])   . Hypertension     Past Surgical History:  Procedure Laterality Date  . APPENDECTOMY      Family History  Problem Relation Age of Onset  . Heart disease Father     Social History:  reports that she has never smoked. She has never used smokeless tobacco. She reports that she does not drink alcohol. Her drug history is not on file.  Review of Systems:      Osteopenia: She is on Evista from her PCP   HYPERTENSION:  she has had long-standing hypertension treated with multiple drugs Norvasc was increased on her last visit   Blood pressure appears better on this visit  BP Readings from Last 3 Encounters:  03/01/18 130/76  01/18/18 (!) 151/94  09/25/17 (!) 142/72     Renal dysfunction: She has had abnormal renal function but this has been relatively stable for several years   Lab Results  Component Value Date   CREATININE 1.50  (H) 01/18/2018    HYPERLIPIDEMIA: The lipid abnormality consists of elevated LDL and high triglycerides  She is taking pravastatin 80 mg and fenofibrate 160 mg daily   Lab Results  Component Value Date   CHOL 153 09/25/2017   HDL 37.30 (L) 09/25/2017   LDLCALC 73 03/27/2017   LDLDIRECT 85.0 09/25/2017   TRIG 268.0 (H) 09/25/2017   CHOLHDL 4 09/25/2017     Examination:   BP 130/76 (BP Location: Right Arm, Patient Position: Sitting, Cuff Size: Normal)   Pulse 83   Ht 5\' 5"  (1.651 m)   Wt 169 lb (76.7 kg)   SpO2 98%   BMI 28.12 kg/m   Body mass index is 28.12 kg/m.     ASSESSMENT/ PLAN:   Diabetes type 2 long-standing  See history of present illness for detailed discussion of  current management, blood sugar patterns and problems identified  Her last A1c was 9.1  She is having persistently high blood sugars now especially nonfasting With a long history of diabetes she appears to be more insulin deficient now  She had nonspecific malaise and decreased appetite and  etiology is unclear Her management is difficult because of concurrent dementia and not clear if she can reliably take oral her injectable drugs  Recommendations:  Stop Victoza as she is taking only a small dose and currently having relatively decreased appetite  Increase insulin to twice a day and take 8 units before breakfast and suppertime  She can keep her insulin on her  kitchen table and not in the refrigerator to remind her to take it  Keep checking blood sugars at various times of the day  Stay on Prandin before meals for now along with Actos  A1c on the next visit   HYPERTENSION: Blood pressure is improved with increasing Norvasc      There are no Patient Instructions on file for this visit.   Elayne Snare 03/01/2018, 10:12 AM

## 2018-03-06 DIAGNOSIS — E119 Type 2 diabetes mellitus without complications: Secondary | ICD-10-CM | POA: Diagnosis not present

## 2018-03-06 DIAGNOSIS — R410 Disorientation, unspecified: Secondary | ICD-10-CM | POA: Diagnosis not present

## 2018-03-06 DIAGNOSIS — M542 Cervicalgia: Secondary | ICD-10-CM | POA: Diagnosis not present

## 2018-03-06 DIAGNOSIS — F0390 Unspecified dementia without behavioral disturbance: Secondary | ICD-10-CM | POA: Diagnosis not present

## 2018-03-06 DIAGNOSIS — R51 Headache: Secondary | ICD-10-CM | POA: Diagnosis not present

## 2018-03-06 DIAGNOSIS — R110 Nausea: Secondary | ICD-10-CM | POA: Diagnosis not present

## 2018-03-06 DIAGNOSIS — R739 Hyperglycemia, unspecified: Secondary | ICD-10-CM | POA: Diagnosis not present

## 2018-03-06 DIAGNOSIS — E108 Type 1 diabetes mellitus with unspecified complications: Secondary | ICD-10-CM | POA: Diagnosis not present

## 2018-03-06 DIAGNOSIS — R112 Nausea with vomiting, unspecified: Secondary | ICD-10-CM | POA: Diagnosis not present

## 2018-03-06 DIAGNOSIS — K219 Gastro-esophageal reflux disease without esophagitis: Secondary | ICD-10-CM | POA: Diagnosis not present

## 2018-03-06 DIAGNOSIS — R1110 Vomiting, unspecified: Secondary | ICD-10-CM | POA: Diagnosis not present

## 2018-03-06 DIAGNOSIS — I517 Cardiomegaly: Secondary | ICD-10-CM | POA: Diagnosis not present

## 2018-03-07 DIAGNOSIS — E785 Hyperlipidemia, unspecified: Secondary | ICD-10-CM | POA: Diagnosis not present

## 2018-03-07 DIAGNOSIS — R112 Nausea with vomiting, unspecified: Secondary | ICD-10-CM | POA: Diagnosis not present

## 2018-03-07 DIAGNOSIS — G301 Alzheimer's disease with late onset: Secondary | ICD-10-CM | POA: Diagnosis not present

## 2018-03-07 DIAGNOSIS — Z794 Long term (current) use of insulin: Secondary | ICD-10-CM | POA: Diagnosis not present

## 2018-03-07 DIAGNOSIS — K219 Gastro-esophageal reflux disease without esophagitis: Secondary | ICD-10-CM | POA: Diagnosis not present

## 2018-03-07 DIAGNOSIS — I10 Essential (primary) hypertension: Secondary | ICD-10-CM | POA: Diagnosis not present

## 2018-03-07 DIAGNOSIS — E1169 Type 2 diabetes mellitus with other specified complication: Secondary | ICD-10-CM | POA: Diagnosis not present

## 2018-03-07 DIAGNOSIS — R51 Headache: Secondary | ICD-10-CM | POA: Diagnosis not present

## 2018-03-08 DIAGNOSIS — K279 Peptic ulcer, site unspecified, unspecified as acute or chronic, without hemorrhage or perforation: Secondary | ICD-10-CM

## 2018-03-08 DIAGNOSIS — R001 Bradycardia, unspecified: Secondary | ICD-10-CM | POA: Insufficient documentation

## 2018-03-08 DIAGNOSIS — K219 Gastro-esophageal reflux disease without esophagitis: Secondary | ICD-10-CM

## 2018-03-08 DIAGNOSIS — E669 Obesity, unspecified: Secondary | ICD-10-CM

## 2018-03-08 HISTORY — DX: Bradycardia, unspecified: R00.1

## 2018-03-08 HISTORY — DX: Peptic ulcer, site unspecified, unspecified as acute or chronic, without hemorrhage or perforation: K27.9

## 2018-03-08 HISTORY — DX: Gastro-esophageal reflux disease without esophagitis: K21.9

## 2018-03-08 HISTORY — DX: Obesity, unspecified: E66.9

## 2018-03-09 DIAGNOSIS — I10 Essential (primary) hypertension: Secondary | ICD-10-CM | POA: Diagnosis not present

## 2018-03-09 DIAGNOSIS — Z7982 Long term (current) use of aspirin: Secondary | ICD-10-CM | POA: Diagnosis not present

## 2018-03-09 DIAGNOSIS — E119 Type 2 diabetes mellitus without complications: Secondary | ICD-10-CM | POA: Diagnosis not present

## 2018-03-09 DIAGNOSIS — F0391 Unspecified dementia with behavioral disturbance: Secondary | ICD-10-CM | POA: Diagnosis not present

## 2018-03-09 DIAGNOSIS — F0390 Unspecified dementia without behavioral disturbance: Secondary | ICD-10-CM | POA: Diagnosis not present

## 2018-03-09 DIAGNOSIS — Z794 Long term (current) use of insulin: Secondary | ICD-10-CM | POA: Diagnosis not present

## 2018-03-09 DIAGNOSIS — Z602 Problems related to living alone: Secondary | ICD-10-CM | POA: Diagnosis not present

## 2018-03-11 DIAGNOSIS — I10 Essential (primary) hypertension: Secondary | ICD-10-CM | POA: Diagnosis not present

## 2018-03-11 DIAGNOSIS — Z7982 Long term (current) use of aspirin: Secondary | ICD-10-CM | POA: Diagnosis not present

## 2018-03-11 DIAGNOSIS — Z602 Problems related to living alone: Secondary | ICD-10-CM | POA: Diagnosis not present

## 2018-03-11 DIAGNOSIS — E119 Type 2 diabetes mellitus without complications: Secondary | ICD-10-CM | POA: Diagnosis not present

## 2018-03-11 DIAGNOSIS — Z794 Long term (current) use of insulin: Secondary | ICD-10-CM | POA: Diagnosis not present

## 2018-03-12 DIAGNOSIS — Z794 Long term (current) use of insulin: Secondary | ICD-10-CM | POA: Diagnosis not present

## 2018-03-12 DIAGNOSIS — E119 Type 2 diabetes mellitus without complications: Secondary | ICD-10-CM | POA: Diagnosis not present

## 2018-03-12 DIAGNOSIS — F0390 Unspecified dementia without behavioral disturbance: Secondary | ICD-10-CM | POA: Diagnosis not present

## 2018-03-12 DIAGNOSIS — I10 Essential (primary) hypertension: Secondary | ICD-10-CM | POA: Diagnosis not present

## 2018-03-12 DIAGNOSIS — Z602 Problems related to living alone: Secondary | ICD-10-CM | POA: Diagnosis not present

## 2018-03-12 DIAGNOSIS — Z7982 Long term (current) use of aspirin: Secondary | ICD-10-CM | POA: Diagnosis not present

## 2018-03-12 NOTE — Progress Notes (Signed)
Cardiology Office Note:    Date:  03/13/2018   ID:  Vanessa Craig, DOB 1928-10-10, MRN [id number]  PCP:  Vanessa Kiel, MD  Cardiologist:  Vanessa More, MD    Referring MD: Vanessa Kiel, MD    ASSESSMENT:    1. Hypertensive kidney disease with stage 3 chronic kidney disease ([place])   2. Chronic kidney disease, stage III (moderate) ([facility])   3. Type 2 diabetes mellitus with complication, unspecified whether long term insulin use ([place])   4. Sinus bradycardia   5. Thyroid mass    PLAN:    In order of problems listed above:  1. Worsened with symptomatic hypotension medications adjusted.  Visiting nurses seeing her and the daughter will check blood pressures daily 2. Improved and recent recent lab work [facility] ED 3. Stable managed by her family physician and endocrinologist 4. Worsened with dual therapy for dementia beta-blocker dose decreased if necessary will discontinue. 5. To be evaluated by endocrinology the patient is Vanessa Craig seen in the practice for diabetes   Next appointment: 3 months   Medication Adjustments/Labs and Tests Ordered: Current medicines are reviewed at length with the patient today.  Concerns regarding medicines are outlined above.  Orders Placed This Encounter  Procedures  . EKG 12-Lead   Meds ordered this encounter  Medications  . carvedilol (COREG) 6.25 MG tablet    Sig: Take 0.5 tablets (3.125 mg total) by mouth 2 (two) times daily with a meal. Do not take the second if HR <55 BPM    Dispense:  30 tablet    Refill:  3  . ramipril (ALTACE) 10 MG capsule    Sig: Take 1 capsule (10 mg total) by mouth 2 (two) times daily. Do not take second-PM dose if systolic < 756    Dispense:  60 capsule    Refill:  3    Chief Complaint  Patient presents with  . Follow-up  . Hypertension    History of Present Illness:    Vanessa Craig is a 84 y.o. female with a hx of hypertension last seen 03/06/17. Seen at [facility] ED 03/06/18 with  confusion, nausea and found to have a thyroid mass, nodular 2.2 by 1.1 cm. Compliance with diet, lifestyle and medications: Yes now supervised by her daughter Dementia is now becoming a greater problem and recently was in the emergency room with nausea and concerns about safety at home and compliance with medications.  Her daughter has become increasingly involved in her care visiting nurses at the home and home blood pressures have been relatively low in the range of 433 systolic over 50 diastolic.  Her mother is noticed intermittent lightheadedness but is had no syncope.  No chest pain palpitation orthopnea or edema.  She is on double therapy for dementia as well as a beta-blocker and today her heart rhythm is sinus bradycardia 48 bpm I reduce the dose of her beta-blocker by 50% and I asked her daughter to hold the second dose daily for heart rates of less than 55.  Her systolic blood pressure is 102 and she will skip her second dose of ACE inhibitor if less than 125.  I do not think that we should disrupt her medications for dementia.  She was also noted to have a thyroid mass she sees an endocrinologist and I asked the daughter to bring it to his attention. Past Medical History:  Diagnosis Date  . Chronic kidney disease, stage III (moderate) ([place]) 08/06/2013  . Dementia   .  Diabetes mellitus without complication ([place])   . DM (diabetes mellitus) ([place]) 07/31/2013  . Esophageal reflux 03/08/2018  . Essential hypertension 08/24/2016  . Hypertension   . Obesity 03/08/2018  . Other and unspecified hyperlipidemia 08/06/2013  . PUD (peptic ulcer disease) 03/08/2018  . Sinus bradycardia 03/08/2018  . Unspecified essential hypertension 08/04/2013    Past Surgical History:  Procedure Laterality Date  . APPENDECTOMY      Current Medications: Current Meds  Medication Sig  . amLODipine (NORVASC) 10 MG tablet Take 1 tablet (10 mg total) by mouth daily.  Marland Kitchen aspirin 81 MG tablet Take 81 mg by mouth at bedtime.     . B-D ULTRAFINE III SHORT PEN 31G X 8 MM MISC Use 2 daily  . carvedilol (COREG) 6.25 MG tablet Take 0.5 tablets (3.125 mg total) by mouth 2 (two) times daily with a meal. Do not take the second if HR <55 BPM  . Cholecalciferol (VITAMIN D3) 3000 UNITS TABS Take 1 capsule by mouth at bedtime.   Marland Kitchen glucose blood (ONE TOUCH ULTRA TEST) test strip Use to test blood sugar once daily as instructed. Dx code: E11.65  . guanFACINE (TENEX) 2 MG tablet Take 2 mg by mouth at bedtime.   . hydrochlorothiazide (MICROZIDE) 12.5 MG capsule Take 1 capsule (12.5 mg total) by mouth daily.  . insulin aspart protamine - aspart (NOVOLOG MIX 70/30 FLEXPEN) (70-30) 100 UNIT/ML FlexPen Inject 0.08 mLs (8 Units total) into the skin daily before supper. (Patient taking differently: Inject 8 Units into the skin 2 (two) times daily with a meal. )  . lansoprazole (PREVACID) 15 MG capsule TAKE 1 CAPSULE BY MOUTH FOR STOMACH ACID ONCE A DAY IN THE MORNINGS  . meclizine (ANTIVERT) 12.5 MG tablet Take 12.5 mg by mouth daily as needed for dizziness.   . Multiple Vitamin (MULTIVITAMIN WITH MINERALS) TABS tablet Take 1 tablet by mouth daily.  . Omega-3 Fatty Acids (FISH OIL) 1000 MG CAPS Take 1,000 mg by mouth daily.   . pioglitazone (ACTOS) 15 MG tablet TAKE 1 TABLET (15 MG TOTAL) BY MOUTH DAILY.  . pravastatin (PRAVACHOL) 40 MG tablet TAKE 1 TABLET ONCE A DAY FOR CHOLESTEROL  . raloxifene (EVISTA) 60 MG tablet TAKE 1 TABLET BY MOUTH EVERY DAY  . ramipril (ALTACE) 10 MG capsule Take 1 capsule (10 mg total) by mouth 2 (two) times daily. Do not take second-PM dose if systolic < 235  . ranitidine (ZANTAC) 300 MG tablet TAKE 1 TABLET BY MOUTH EVERYDAY AT BEDTIME  . repaglinide (PRANDIN) 1 MG tablet 1 tablet before breakfast, 2 tablets before lunch and dinner  . rivastigmine (EXELON) 4.6 mg/24hr Place 4.6 mg onto the skin daily.  . traZODone (DESYREL) 50 MG tablet Take 50 mg by mouth at bedtime as needed for sleep.   . [DISCONTINUED]  carvedilol (COREG) 6.25 MG tablet Take 6.25 mg by mouth 2 (two) times daily with a meal.   . [DISCONTINUED] ramipril (ALTACE) 10 MG capsule Take 10 mg by mouth 2 (two) times daily.      Allergies:   Codeine   Social History   Socioeconomic History  . Marital status: Unknown    Spouse name: Not on file  . Number of children: Not on file  . Years of education: Not on file  . Highest education level: Not on file  Occupational History  . Not on file  Social Needs  . Financial resource strain: Not on file  . Food insecurity:    Worry:  Not on file    Inability: Not on file  . Transportation needs:    Medical: Not on file    Non-medical: Not on file  Tobacco Use  . Smoking status: Never Smoker  . Smokeless tobacco: Never Used  Substance and Sexual Activity  . Alcohol use: No  . Drug use: Never  . Sexual activity: Not on file  Lifestyle  . Physical activity:    Days per week: Not on file    Minutes per session: Not on file  . Stress: Not on file  Relationships  . Social connections:    Talks on phone: Not on file    Gets together: Not on file    Attends religious service: Not on file    Active member of club or organization: Not on file    Attends meetings of clubs or organizations: Not on file    Relationship status: Not on file  Other Topics Concern  . Not on file  Social History Narrative  . Not on file     Family History: The patient's family history includes Heart attack in her father; Heart disease in her father; Hypertension in her mother. ROS:   Please see the history of present illness.    All other systems reviewed and are negative.  EKGs/Labs/Other Studies Reviewed:    The following studies were reviewed today:  EKG:  03/06/18 [place] normal EKG  Recent Labs: 03/06/18 CBC and BMP normal except Cr 1.2 01/18/2018: ALT 12; BUN 22; Creatinine, Ser 1.50; Potassium 4.2; Sodium 138  Recent Lipid Panel    Component Value Date/Time   CHOL 153 09/25/2017 1114     TRIG 268.0 (H) 09/25/2017 1114   HDL 37.30 (L) 09/25/2017 1114   CHOLHDL 4 09/25/2017 1114   VLDL 53.6 (H) 09/25/2017 1114   LDLCALC 73 03/27/2017 1058   LDLDIRECT 85.0 09/25/2017 1114    Physical Exam:    VS:  BP (!) 102/52 (BP Location: Right Arm, Patient Position: Sitting, Cuff Size: Large)   Pulse (!) 48   Ht 5\' 5"  (1.651 m)   Wt 176 lb (79.8 kg)   SpO2 97%   BMI 29.29 kg/m     Wt Readings from Last 3 Encounters:  03/13/18 176 lb (79.8 kg)  03/01/18 169 lb (76.7 kg)  01/18/18 171 lb 6.4 oz (77.7 kg)     GEN:  Well nourished, well developed in no acute distress HEENT: Normal NECK: No JVD; No carotid bruits LYMPHATICS: No lymphadenopathy CARDIAC: RRR, no murmurs, rubs, gallops RESPIRATORY:  Clear to auscultation without rales, wheezing or rhonchi  ABDOMEN: Soft, non-tender, non-distended MUSCULOSKELETAL:  No edema; No deformity  SKIN: Warm and dry NEUROLOGIC:  Alert and oriented x 3 PSYCHIATRIC:  Normal affect    Signed, Vanessa More, MD  03/13/2018 7:31 PM    [place]

## 2018-03-13 ENCOUNTER — Encounter: Payer: Self-pay | Admitting: Cardiology

## 2018-03-13 ENCOUNTER — Ambulatory Visit (INDEPENDENT_AMBULATORY_CARE_PROVIDER_SITE_OTHER): Payer: Medicare Other | Admitting: Cardiology

## 2018-03-13 VITALS — BP 102/52 | HR 48 | Ht 65.0 in | Wt 176.0 lb

## 2018-03-13 DIAGNOSIS — N183 Chronic kidney disease, stage 3 unspecified: Secondary | ICD-10-CM

## 2018-03-13 DIAGNOSIS — E118 Type 2 diabetes mellitus with unspecified complications: Secondary | ICD-10-CM

## 2018-03-13 DIAGNOSIS — E079 Disorder of thyroid, unspecified: Secondary | ICD-10-CM | POA: Diagnosis not present

## 2018-03-13 DIAGNOSIS — I129 Hypertensive chronic kidney disease with stage 1 through stage 4 chronic kidney disease, or unspecified chronic kidney disease: Secondary | ICD-10-CM

## 2018-03-13 DIAGNOSIS — R001 Bradycardia, unspecified: Secondary | ICD-10-CM | POA: Diagnosis not present

## 2018-03-13 MED ORDER — RAMIPRIL 10 MG PO CAPS: 10.0000 mg | ORAL_CAPSULE | Freq: Two times a day (BID) | ORAL | 3 refills | Status: DC

## 2018-03-13 MED ORDER — CARVEDILOL 6.25 MG PO TABS: 3.1250 mg | ORAL_TABLET | Freq: Two times a day (BID) | ORAL | 3 refills | Status: AC

## 2018-03-13 NOTE — Patient Instructions (Signed)
Medication Instructions:  Your physician has recommended you make the following change in your medication:  DECREASE carvedilol (Coreg) to 3.125 mg twice daily. Hold 2cd dose if heart rate less than 55 beats per minute.  CHANGE ramipril (Altace) - Skip 2cd dose if blood pressure less than 125 for top number.  Labwork: None  Testing/Procedures: None  Follow-Up: Your physician wants you to follow-up in: 3 months. You will receive a reminder letter in the mail two months in advance. If you don't receive a letter, please call our office to schedule the follow-up appointment.  Any Other Special Instructions Will Be Listed Below (If Applicable).     If you need a refill on your cardiac medications before your next appointment, please call your pharmacy.

## 2018-03-14 DIAGNOSIS — F0390 Unspecified dementia without behavioral disturbance: Secondary | ICD-10-CM | POA: Diagnosis not present

## 2018-03-14 DIAGNOSIS — Z794 Long term (current) use of insulin: Secondary | ICD-10-CM | POA: Diagnosis not present

## 2018-03-14 DIAGNOSIS — Z7982 Long term (current) use of aspirin: Secondary | ICD-10-CM | POA: Diagnosis not present

## 2018-03-14 DIAGNOSIS — Z602 Problems related to living alone: Secondary | ICD-10-CM | POA: Diagnosis not present

## 2018-03-14 DIAGNOSIS — I10 Essential (primary) hypertension: Secondary | ICD-10-CM | POA: Diagnosis not present

## 2018-03-14 DIAGNOSIS — E119 Type 2 diabetes mellitus without complications: Secondary | ICD-10-CM | POA: Diagnosis not present

## 2018-03-15 DIAGNOSIS — Z602 Problems related to living alone: Secondary | ICD-10-CM | POA: Diagnosis not present

## 2018-03-15 DIAGNOSIS — I10 Essential (primary) hypertension: Secondary | ICD-10-CM | POA: Diagnosis not present

## 2018-03-15 DIAGNOSIS — F0390 Unspecified dementia without behavioral disturbance: Secondary | ICD-10-CM | POA: Diagnosis not present

## 2018-03-15 DIAGNOSIS — Z794 Long term (current) use of insulin: Secondary | ICD-10-CM | POA: Diagnosis not present

## 2018-03-15 DIAGNOSIS — E119 Type 2 diabetes mellitus without complications: Secondary | ICD-10-CM | POA: Diagnosis not present

## 2018-03-15 DIAGNOSIS — Z7982 Long term (current) use of aspirin: Secondary | ICD-10-CM | POA: Diagnosis not present

## 2018-03-19 DIAGNOSIS — I10 Essential (primary) hypertension: Secondary | ICD-10-CM | POA: Diagnosis not present

## 2018-03-19 DIAGNOSIS — E119 Type 2 diabetes mellitus without complications: Secondary | ICD-10-CM | POA: Diagnosis not present

## 2018-03-19 DIAGNOSIS — F0390 Unspecified dementia without behavioral disturbance: Secondary | ICD-10-CM | POA: Diagnosis not present

## 2018-03-19 DIAGNOSIS — Z7982 Long term (current) use of aspirin: Secondary | ICD-10-CM | POA: Diagnosis not present

## 2018-03-19 DIAGNOSIS — Z794 Long term (current) use of insulin: Secondary | ICD-10-CM | POA: Diagnosis not present

## 2018-03-19 DIAGNOSIS — Z602 Problems related to living alone: Secondary | ICD-10-CM | POA: Diagnosis not present

## 2018-03-22 DIAGNOSIS — I10 Essential (primary) hypertension: Secondary | ICD-10-CM | POA: Diagnosis not present

## 2018-03-22 DIAGNOSIS — E119 Type 2 diabetes mellitus without complications: Secondary | ICD-10-CM | POA: Diagnosis not present

## 2018-03-22 DIAGNOSIS — Z794 Long term (current) use of insulin: Secondary | ICD-10-CM | POA: Diagnosis not present

## 2018-03-22 DIAGNOSIS — F0390 Unspecified dementia without behavioral disturbance: Secondary | ICD-10-CM | POA: Diagnosis not present

## 2018-03-22 DIAGNOSIS — Z602 Problems related to living alone: Secondary | ICD-10-CM | POA: Diagnosis not present

## 2018-03-22 DIAGNOSIS — Z7982 Long term (current) use of aspirin: Secondary | ICD-10-CM | POA: Diagnosis not present

## 2018-03-27 DIAGNOSIS — F0390 Unspecified dementia without behavioral disturbance: Secondary | ICD-10-CM | POA: Diagnosis not present

## 2018-03-27 DIAGNOSIS — I10 Essential (primary) hypertension: Secondary | ICD-10-CM | POA: Diagnosis not present

## 2018-03-27 DIAGNOSIS — Z794 Long term (current) use of insulin: Secondary | ICD-10-CM | POA: Diagnosis not present

## 2018-03-27 DIAGNOSIS — Z7982 Long term (current) use of aspirin: Secondary | ICD-10-CM | POA: Diagnosis not present

## 2018-03-27 DIAGNOSIS — Z602 Problems related to living alone: Secondary | ICD-10-CM | POA: Diagnosis not present

## 2018-03-27 DIAGNOSIS — E119 Type 2 diabetes mellitus without complications: Secondary | ICD-10-CM | POA: Diagnosis not present

## 2018-03-29 DIAGNOSIS — F0390 Unspecified dementia without behavioral disturbance: Secondary | ICD-10-CM | POA: Diagnosis not present

## 2018-03-29 DIAGNOSIS — Z602 Problems related to living alone: Secondary | ICD-10-CM | POA: Diagnosis not present

## 2018-03-29 DIAGNOSIS — I10 Essential (primary) hypertension: Secondary | ICD-10-CM | POA: Diagnosis not present

## 2018-03-29 DIAGNOSIS — Z794 Long term (current) use of insulin: Secondary | ICD-10-CM | POA: Diagnosis not present

## 2018-03-29 DIAGNOSIS — Z7982 Long term (current) use of aspirin: Secondary | ICD-10-CM | POA: Diagnosis not present

## 2018-03-29 DIAGNOSIS — E119 Type 2 diabetes mellitus without complications: Secondary | ICD-10-CM | POA: Diagnosis not present

## 2018-04-01 ENCOUNTER — Telehealth: Payer: Self-pay

## 2018-04-01 ENCOUNTER — Other Ambulatory Visit: Payer: Self-pay

## 2018-04-01 MED ORDER — REPAGLINIDE 1 MG PO TABS: ORAL_TABLET | ORAL | 3 refills | Status: DC

## 2018-04-01 NOTE — Telephone Encounter (Signed)
Blood sugar readings are inconsistent with one good reading in the afternoon and other 2 high and also inconsistent in the morning Since her sugars are not consistently high she will continue the same doses of insulin of 8 units twice a day

## 2018-04-01 NOTE — Telephone Encounter (Signed)
Error

## 2018-04-01 NOTE — Telephone Encounter (Signed)
Patient's daughter was called and left detailed voicemail.

## 2018-04-01 NOTE — Telephone Encounter (Signed)
Spoke with patients daughter in regards to her blood sugars. Daughter states that Dr. Dwyane Dee made some adjustments to her medications and the readings have seemed to be a little higher than normal.

## 2018-04-05 DIAGNOSIS — I10 Essential (primary) hypertension: Secondary | ICD-10-CM | POA: Diagnosis not present

## 2018-04-05 DIAGNOSIS — F0390 Unspecified dementia without behavioral disturbance: Secondary | ICD-10-CM | POA: Diagnosis not present

## 2018-04-05 DIAGNOSIS — E119 Type 2 diabetes mellitus without complications: Secondary | ICD-10-CM | POA: Diagnosis not present

## 2018-04-05 DIAGNOSIS — Z602 Problems related to living alone: Secondary | ICD-10-CM | POA: Diagnosis not present

## 2018-04-05 DIAGNOSIS — Z794 Long term (current) use of insulin: Secondary | ICD-10-CM | POA: Diagnosis not present

## 2018-04-05 DIAGNOSIS — Z7982 Long term (current) use of aspirin: Secondary | ICD-10-CM | POA: Diagnosis not present

## 2018-04-09 DIAGNOSIS — F0390 Unspecified dementia without behavioral disturbance: Secondary | ICD-10-CM | POA: Diagnosis not present

## 2018-04-09 DIAGNOSIS — Z794 Long term (current) use of insulin: Secondary | ICD-10-CM | POA: Diagnosis not present

## 2018-04-09 DIAGNOSIS — I10 Essential (primary) hypertension: Secondary | ICD-10-CM | POA: Diagnosis not present

## 2018-04-09 DIAGNOSIS — Z7982 Long term (current) use of aspirin: Secondary | ICD-10-CM | POA: Diagnosis not present

## 2018-04-09 DIAGNOSIS — Z602 Problems related to living alone: Secondary | ICD-10-CM | POA: Diagnosis not present

## 2018-04-09 DIAGNOSIS — E119 Type 2 diabetes mellitus without complications: Secondary | ICD-10-CM | POA: Diagnosis not present

## 2018-04-17 DIAGNOSIS — E119 Type 2 diabetes mellitus without complications: Secondary | ICD-10-CM | POA: Diagnosis not present

## 2018-04-17 DIAGNOSIS — Z602 Problems related to living alone: Secondary | ICD-10-CM | POA: Diagnosis not present

## 2018-04-17 DIAGNOSIS — Z794 Long term (current) use of insulin: Secondary | ICD-10-CM | POA: Diagnosis not present

## 2018-04-17 DIAGNOSIS — F0390 Unspecified dementia without behavioral disturbance: Secondary | ICD-10-CM | POA: Diagnosis not present

## 2018-04-17 DIAGNOSIS — Z7982 Long term (current) use of aspirin: Secondary | ICD-10-CM | POA: Diagnosis not present

## 2018-04-17 DIAGNOSIS — I10 Essential (primary) hypertension: Secondary | ICD-10-CM | POA: Diagnosis not present

## 2018-04-25 DIAGNOSIS — E1142 Type 2 diabetes mellitus with diabetic polyneuropathy: Secondary | ICD-10-CM

## 2018-04-25 DIAGNOSIS — L600 Ingrowing nail: Secondary | ICD-10-CM | POA: Diagnosis not present

## 2018-04-25 DIAGNOSIS — M2042 Other hammer toe(s) (acquired), left foot: Secondary | ICD-10-CM

## 2018-04-25 DIAGNOSIS — B351 Tinea unguium: Secondary | ICD-10-CM

## 2018-04-25 HISTORY — DX: Tinea unguium: B35.1

## 2018-04-25 HISTORY — DX: Other hammer toe(s) (acquired), left foot: M20.42

## 2018-04-25 HISTORY — DX: Type 2 diabetes mellitus with diabetic polyneuropathy: E11.42

## 2018-04-26 ENCOUNTER — Ambulatory Visit (INDEPENDENT_AMBULATORY_CARE_PROVIDER_SITE_OTHER): Payer: Medicare Other | Admitting: Endocrinology

## 2018-04-26 ENCOUNTER — Other Ambulatory Visit: Payer: Self-pay | Admitting: Endocrinology

## 2018-04-26 ENCOUNTER — Encounter: Payer: Self-pay | Admitting: Endocrinology

## 2018-04-26 VITALS — BP 138/74 | HR 64 | Ht 65.0 in | Wt 180.4 lb

## 2018-04-26 DIAGNOSIS — N183 Chronic kidney disease, stage 3 unspecified: Secondary | ICD-10-CM

## 2018-04-26 DIAGNOSIS — E118 Type 2 diabetes mellitus with unspecified complications: Secondary | ICD-10-CM

## 2018-04-26 DIAGNOSIS — E1165 Type 2 diabetes mellitus with hyperglycemia: Secondary | ICD-10-CM | POA: Diagnosis not present

## 2018-04-26 DIAGNOSIS — E782 Mixed hyperlipidemia: Secondary | ICD-10-CM

## 2018-04-26 DIAGNOSIS — Z794 Long term (current) use of insulin: Secondary | ICD-10-CM

## 2018-04-26 DIAGNOSIS — R600 Localized edema: Secondary | ICD-10-CM | POA: Diagnosis not present

## 2018-04-26 DIAGNOSIS — I10 Essential (primary) hypertension: Secondary | ICD-10-CM

## 2018-04-26 LAB — POCT GLUCOSE (DEVICE FOR HOME USE): Glucose Fasting, POC: 65 mg/dL — AB (ref 70–99)

## 2018-04-26 LAB — LIPID PANEL
Cholesterol: 203 mg/dL — ABNORMAL HIGH (ref 0–200)
HDL: 45.3 mg/dL (ref >39.00)
LDL Cholesterol: 125 mg/dL — ABNORMAL HIGH (ref 0–99)
NONHDL: 157.74
Total CHOL/HDL Ratio: 4
Triglycerides: 166 mg/dL — ABNORMAL HIGH (ref 0.0–149.0)
VLDL: 33.2 mg/dL (ref 0.0–40.0)

## 2018-04-26 LAB — BASIC METABOLIC PANEL
BUN: 23 mg/dL (ref 6–23)
CO2: 29 meq/L (ref 19–32)
Calcium: 9.7 mg/dL (ref 8.4–10.5)
Chloride: 104 mEq/L (ref 96–112)
Creatinine, Ser: 1.71 mg/dL — ABNORMAL HIGH (ref 0.40–1.20)
GFR: 29.92 mL/min — AB (ref >60.00)
Glucose, Bld: 57 mg/dL — ABNORMAL LOW (ref 70–99)
POTASSIUM: 4.1 meq/L (ref 3.5–5.1)
SODIUM: 140 meq/L (ref 135–145)

## 2018-04-26 LAB — POCT GLYCOSYLATED HEMOGLOBIN (HGB A1C): HEMOGLOBIN A1C: 8.4 % — AB (ref 4.0–5.6)

## 2018-04-26 NOTE — Progress Notes (Signed)
Patient ID: Vanessa Craig, female   DOB: May 22, 1928, 84 y.o.   MRN: [id number]   Reason for Appointment: Diabetes follow-up   History of Present Illness   Diagnosis: Type 2 DIABETES MELITUS, date of diagnosis: 1995        She has had mild and usually well-controlled diabetes for several years Previously on metformin and this was stopped because of renal dysfunction Also has been tried on low dose basal insulin previously However with using GLP-1 drugs like Byetta and Victoza her blood sugars have been fairly well controlled She has been on Victoza since 2011   Because of significantly high readings including over 300 in October 2017 she was started on low-dose premixed insulin at suppertime  Recent history:  Non-insulin hypoglycemic drugs:  Actos 15 mg in a.m.Marland Kitchen  Prandin 2 mg before breakfast, 2 mg at lunch, 2 at supper.     INSULIN regimen: NovoLog 70/30, 8 units at suppertime  Her A1c is relatively better at 8.4, previously 9.1    Current management, blood sugar patterns and problems identified:  She was told to increase her insulin to twice a day about 8 weeks ago  This was because of blood sugars over 200 at suppertime  However blood sugars are still averaging over 200 at suppertime  Her daughter says that she is sometimes forgetting to take her Prandin at lunchtime  On an average overall her blood sugars are better and also A1c  She is checking blood sugar somewhat erratically and very few readings in the mornings recently  Otherwise FASTING readings are not significantly high recently  Lowest readings appear to be at lunchtime  Her weight gain has gone back up  On her visit in 4/19 she was having somewhat decreased appetite and occasional nausea and her Victoza 0.6 mg was stopped, with this her nausea is improved and she is eating better   Hypoglycemia: None  Side effects from medications: Nausea from Victoza  Monitors blood glucose:  about once a  day or less .    Glucometer: One Touch Verio      Blood Glucose readings   Mean values apply above for all meters except median for One Touch  PRE-MEAL Fasting Lunch Dinner Bedtime Overall  Glucose range:  150-202   142-267    Mean/median:   157  232  188   POST-MEAL PC Breakfast PC Lunch PC Dinner  Glucose range:  153, 250   184-312  Mean/median:    219   Previous readings:  Mean values apply above for all meters except median for One Touch  PRE-MEAL Fasting Lunch Dinner Bedtime Overall  Glucose range:  166-201  224-303  150-342    Mean/median:  175  250  241  230    Meals: 3 meals per day. Dinner 6-7pm  Special K Cereal or raisin bran Sometimes with cheese in am for breakfast or rarely eggs, variable carbohydrate intake at other meals         Physical activity: exercise: Minimal  Dietician visit: Most recent: Years ago          Wt Readings from Last 3 Encounters:  04/26/18 180 lb 6.4 oz (81.8 kg)  03/13/18 176 lb (79.8 kg)  03/01/18 169 lb (76.7 kg)    Lab Results  Component Value Date   HGBA1C 9.1 01/18/2018   HGBA1C 7.7 09/25/2017   HGBA1C 7.2 06/26/2017   Lab Results  Component Value Date   MICROALBUR 8.4 (H) 01/18/2018  [place] [address] 03/27/2017   CREATININE 1.50 (H) 01/18/2018     OTHER active problems: See review of systems   No visits with results within 1 Week(s) from this visit.  Latest known visit with results is:  Office Visit on 01/18/2018  Component Date Value Ref Range Status  . Hemoglobin A1C 01/18/2018 9.1   Final  . VITD 01/18/2018 30.32  30.00 - 100.00 ng/mL Final  . Microalb, Ur 01/18/2018 8.4* 0.0 - 1.9 mg/dL Final  . Creatinine,U 01/18/2018 225.1  mg/dL Final  . Microalb Creat Ratio 01/18/2018 3.7  0.0 - 30.0 mg/g Final  . Sodium 01/18/2018 138  135 - 145 mEq/L Final  . Potassium 01/18/2018 4.2  3.5 - 5.1 mEq/L Final  . Chloride 01/18/2018 102  96 - 112 mEq/L Final  . CO2 01/18/2018 27  19 - 32 mEq/L Final  . Glucose, Bld 01/18/2018  246* 70 - 99 mg/dL Final  . BUN 01/18/2018 22  6 - 23 mg/dL Final  . Creatinine, Ser 01/18/2018 1.50* 0.40 - 1.20 mg/dL Final  . Total Bilirubin 01/18/2018 0.6  0.2 - 1.2 mg/dL Final  . Alkaline Phosphatase 01/18/2018 48  39 - 117 U/L Final  . AST 01/18/2018 15  0 - 37 U/L Final  . ALT 01/18/2018 12  0 - 35 U/L Final  . Total Protein 01/18/2018 6.4  6.0 - 8.3 g/dL Final  . Albumin 01/18/2018 3.6  3.5 - 5.2 g/dL Final  . Calcium 01/18/2018 9.3  8.4 - 10.5 mg/dL Final  . GFR 01/18/2018 34.82* >60.00 mL/min Final    Allergies as of 04/26/2018      Reactions   Codeine Nausea And Vomiting      Medication List        Accurate as of 04/26/18 10:45 AM. Always use your most recent med list.          amLODipine 10 MG tablet Commonly known as:  NORVASC Take 1 tablet (10 mg total) by mouth daily.   aspirin 81 MG tablet Take 81 mg by mouth at bedtime.   B-D ULTRAFINE III SHORT PEN 31G X 8 MM Misc Generic drug:  Insulin Pen Needle Use 2 daily   carvedilol 6.25 MG tablet Commonly known as:  COREG Take 0.5 tablets (3.125 mg total) by mouth 2 (two) times daily with a meal. Do not take the second if HR <55 BPM   Fish Oil 1000 MG Caps Take 1,000 mg by mouth daily.   glucose blood test strip Commonly known as:  ONE TOUCH ULTRA TEST Use to test blood sugar once daily as instructed. Dx code: E11.65   guanFACINE 2 MG tablet Commonly known as:  TENEX Take 2 mg by mouth at bedtime.   hydrochlorothiazide 12.5 MG capsule Commonly known as:  MICROZIDE Take 1 capsule (12.5 mg total) by mouth daily.   insulin aspart protamine - aspart (70-30) 100 UNIT/ML FlexPen Commonly known as:  NOVOLOG MIX 70/30 FLEXPEN Inject 0.08 mLs (8 Units total) into the skin daily before supper.   lansoprazole 15 MG capsule Commonly known as:  PREVACID TAKE 1 CAPSULE BY MOUTH FOR STOMACH ACID ONCE A DAY IN THE MORNINGS   meclizine 12.5 MG tablet Commonly known as:  ANTIVERT Take 12.5 mg by mouth daily as  needed for dizziness.   multivitamin with minerals Tabs tablet Take 1 tablet by mouth daily.   pioglitazone 15 MG tablet Commonly known as:  ACTOS TAKE 1 TABLET (15 MG TOTAL) BY MOUTH DAILY.  pravastatin 40 MG tablet Commonly known as:  PRAVACHOL TAKE 1 TABLET ONCE A DAY FOR CHOLESTEROL   raloxifene 60 MG tablet Commonly known as:  EVISTA TAKE 1 TABLET BY MOUTH EVERY DAY   ramipril 10 MG capsule Commonly known as:  ALTACE Take 1 capsule (10 mg total) by mouth 2 (two) times daily. Do not take second-PM dose if systolic < 527   ranitidine 300 MG tablet Commonly known as:  ZANTAC TAKE 1 TABLET BY MOUTH EVERYDAY AT BEDTIME   repaglinide 1 MG tablet Commonly known as:  PRANDIN 1 tablet before breakfast, 2 tablets before lunch and dinner   rivastigmine 4.6 mg/24hr Commonly known as:  EXELON Place 4.6 mg onto the skin daily.   traZODone 50 MG tablet Commonly known as:  DESYREL Take 50 mg by mouth at bedtime as needed for sleep.   Vitamin D3 3000 units Tabs Take 1 capsule by mouth at bedtime.       Allergies:  Allergies  Allergen Reactions  . Codeine Nausea And Vomiting    Past Medical History:  Diagnosis Date  . Chronic kidney disease, stage III (moderate) ([place]) 08/06/2013  . Dementia   . Diabetes mellitus without complication ([place])   . DM (diabetes mellitus) ([place]) 07/31/2013  . Esophageal reflux 03/08/2018  . Essential hypertension 08/24/2016  . Hypertension   . Obesity 03/08/2018  . Other and unspecified hyperlipidemia 08/06/2013  . PUD (peptic ulcer disease) 03/08/2018  . Sinus bradycardia 03/08/2018  . Unspecified essential hypertension 08/04/2013    Past Surgical History:  Procedure Laterality Date  . APPENDECTOMY      Family History  Problem Relation Age of Onset  . Hypertension Mother   . Heart disease Father   . Heart attack Father     Social History:  reports that she has never smoked. She has never used smokeless tobacco. She reports that she does  not drink alcohol or use drugs.  Review of Systems:      Osteopenia: She is on Evista from her PCP   HYPERTENSION:  she has had long-standing hypertension treated with multiple drugs She is taking 10 mg of amlodipine in addition to other medications   Blood pressure appears better on this visit  BP Readings from Last 3 Encounters:  04/26/18 138/74  03/13/18 (!) 102/52  03/01/18 130/76   EDEMA: Thinks her swelling has increased recently around her legs No recent shortness of breath  Renal dysfunction: She has had abnormal renal function but this has been  stable for several years   Lab Results  Component Value Date   CREATININE 1.50 (H) 01/18/2018    HYPERLIPIDEMIA: The lipid abnormality consists of elevated LDL and high triglycerides  She is taking pravastatin 80 mg and fenofibrate 160 mg daily   Lab Results  Component Value Date   CHOL 153 09/25/2017   HDL 37.30 (L) 09/25/2017   LDLCALC 73 03/27/2017   LDLDIRECT 85.0 09/25/2017   TRIG 268.0 (H) 09/25/2017   CHOLHDL 4 09/25/2017     Examination:   BP 138/74 (BP Location: Left Arm, Patient Position: Sitting, Cuff Size: Normal)   Pulse 64   Ht 5\' 5"  (1.651 m)   Wt 180 lb 6.4 oz (81.8 kg)   SpO2 95%   BMI 30.02 kg/m   Body mass index is 30.02 kg/m.   1-2+ ankle edema present  ASSESSMENT/ PLAN:   Diabetes type 2 long-standing  See history of present illness for detailed discussion of  current management, blood  sugar patterns and problems identified  Her last A1c was 9.1 A1c has improved slightly to 8.4 which may be adequate for her age and concomitant problems  With a long history of diabetes she appears to be more insulin deficient and doing a little better with twice a day insulin Blood sugars probably are not higher from stopping Victoza However she tends to have higher readings between lunch and suppertime Her morning insulin dose is limited because of blood sugars being relatively lower at  lunchtime from her not getting protein in the morning Ideally should be taking another injection at lunchtime but would be difficult to implement because of her difficulty with memory   Recommendations:  Her daughter will remind her to take her Prandin before lunch  Increase Prandin to 3 tablets of 1 mg at lunchtime  Increase suppertime insulin to 10 units and her daughter will make sure she will most to know that her home to monitor  Needs more readings in the morning on waking up  She should try and start walking regularly May stop Actos because of edema  Her daughter will call if she is starting to have higher readings  HYPERTENSION: Blood pressure is controlled but she appears to be getting some edema from increased Norvasc  Edema: Will try to stop her Actos first without adding a diuretic for now She will follow-up with PCP soon  LIPIDS: Needs follow-up today  Total visit time for evaluation and management of multiple problems and counseling =25 minutes   There are no Patient Instructions on file for this visit.   Elayne Snare 04/26/2018, 10:45 AM

## 2018-04-26 NOTE — Patient Instructions (Addendum)
Increase dinner insulin to 10 units, stay on 8 in am  Increase Prandin to 3 at lunch  Stop Actos

## 2018-05-01 ENCOUNTER — Other Ambulatory Visit: Payer: Self-pay

## 2018-05-01 ENCOUNTER — Telehealth: Payer: Self-pay | Admitting: Endocrinology

## 2018-05-01 MED ORDER — ROSUVASTATIN CALCIUM 10 MG PO TABS: 10.0000 mg | ORAL_TABLET | Freq: Every day | ORAL | 3 refills | Status: DC

## 2018-05-01 NOTE — Telephone Encounter (Signed)
Patient daughter calling stated she had a message to call back in regards to her mothers, appt on Friday and is calling to return the call. Please advise

## 2018-05-06 ENCOUNTER — Other Ambulatory Visit: Payer: Self-pay

## 2018-05-06 MED ORDER — INSULIN ASPART PROT & ASPART (70-30 MIX) 100 UNIT/ML PEN: 8.0000 [IU] | PEN_INJECTOR | Freq: Two times a day (BID) | SUBCUTANEOUS | 3 refills | Status: DC

## 2018-05-28 DIAGNOSIS — E079 Disorder of thyroid, unspecified: Secondary | ICD-10-CM | POA: Diagnosis not present

## 2018-05-28 DIAGNOSIS — K219 Gastro-esophageal reflux disease without esophagitis: Secondary | ICD-10-CM | POA: Diagnosis not present

## 2018-05-28 DIAGNOSIS — R21 Rash and other nonspecific skin eruption: Secondary | ICD-10-CM | POA: Diagnosis not present

## 2018-05-28 DIAGNOSIS — Z794 Long term (current) use of insulin: Secondary | ICD-10-CM | POA: Diagnosis not present

## 2018-05-28 DIAGNOSIS — F0390 Unspecified dementia without behavioral disturbance: Secondary | ICD-10-CM | POA: Diagnosis not present

## 2018-05-28 DIAGNOSIS — I10 Essential (primary) hypertension: Secondary | ICD-10-CM | POA: Diagnosis not present

## 2018-05-28 DIAGNOSIS — M25473 Effusion, unspecified ankle: Secondary | ICD-10-CM | POA: Diagnosis not present

## 2018-05-28 DIAGNOSIS — E785 Hyperlipidemia, unspecified: Secondary | ICD-10-CM | POA: Diagnosis not present

## 2018-05-28 DIAGNOSIS — E1165 Type 2 diabetes mellitus with hyperglycemia: Secondary | ICD-10-CM | POA: Diagnosis not present

## 2018-05-28 DIAGNOSIS — R001 Bradycardia, unspecified: Secondary | ICD-10-CM | POA: Diagnosis not present

## 2018-06-18 DIAGNOSIS — E042 Nontoxic multinodular goiter: Secondary | ICD-10-CM | POA: Diagnosis not present

## 2018-06-18 DIAGNOSIS — E079 Disorder of thyroid, unspecified: Secondary | ICD-10-CM | POA: Diagnosis not present

## 2018-06-24 DIAGNOSIS — F0390 Unspecified dementia without behavioral disturbance: Secondary | ICD-10-CM | POA: Diagnosis not present

## 2018-06-24 DIAGNOSIS — I10 Essential (primary) hypertension: Secondary | ICD-10-CM | POA: Diagnosis not present

## 2018-06-24 DIAGNOSIS — R001 Bradycardia, unspecified: Secondary | ICD-10-CM | POA: Diagnosis not present

## 2018-06-24 DIAGNOSIS — M7989 Other specified soft tissue disorders: Secondary | ICD-10-CM | POA: Diagnosis not present

## 2018-06-24 DIAGNOSIS — Z79899 Other long term (current) drug therapy: Secondary | ICD-10-CM | POA: Diagnosis not present

## 2018-06-24 DIAGNOSIS — E041 Nontoxic single thyroid nodule: Secondary | ICD-10-CM | POA: Diagnosis not present

## 2018-06-25 ENCOUNTER — Ambulatory Visit: Payer: 59 | Admitting: Cardiology

## 2018-06-28 ENCOUNTER — Encounter: Payer: Self-pay | Admitting: Endocrinology

## 2018-06-28 ENCOUNTER — Ambulatory Visit (INDEPENDENT_AMBULATORY_CARE_PROVIDER_SITE_OTHER): Payer: Medicare Other | Admitting: Endocrinology

## 2018-06-28 VITALS — BP 122/70 | HR 59 | Ht 65.0 in | Wt 174.8 lb

## 2018-06-28 DIAGNOSIS — I10 Essential (primary) hypertension: Secondary | ICD-10-CM

## 2018-06-28 DIAGNOSIS — Z794 Long term (current) use of insulin: Secondary | ICD-10-CM

## 2018-06-28 DIAGNOSIS — E782 Mixed hyperlipidemia: Secondary | ICD-10-CM

## 2018-06-28 DIAGNOSIS — E1165 Type 2 diabetes mellitus with hyperglycemia: Secondary | ICD-10-CM

## 2018-06-28 DIAGNOSIS — E041 Nontoxic single thyroid nodule: Secondary | ICD-10-CM

## 2018-06-28 LAB — COMPREHENSIVE METABOLIC PANEL
ALK PHOS: 60 U/L (ref 39–117)
ALT: 15 U/L (ref 0–35)
AST: 17 U/L (ref 0–37)
Albumin: 4.1 g/dL (ref 3.5–5.2)
BUN: 16 mg/dL (ref 6–23)
CALCIUM: 9.8 mg/dL (ref 8.4–10.5)
CO2: 30 mEq/L (ref 19–32)
Chloride: 102 mEq/L (ref 96–112)
Creatinine, Ser: 1.54 mg/dL — ABNORMAL HIGH (ref 0.40–1.20)
GFR: 33.75 mL/min — AB (ref >60.00)
GLUCOSE: 198 mg/dL — AB (ref 70–99)
POTASSIUM: 4.2 meq/L (ref 3.5–5.1)
Sodium: 139 mEq/L (ref 135–145)
TOTAL PROTEIN: 6.9 g/dL (ref 6.0–8.3)
Total Bilirubin: 0.9 mg/dL (ref 0.2–1.2)

## 2018-06-28 LAB — T4, FREE: Free T4: 1.01 ng/dL (ref 0.60–1.60)

## 2018-06-28 LAB — TSH: TSH: 2.82 u[IU]/mL (ref 0.35–4.50)

## 2018-06-28 NOTE — Progress Notes (Signed)
Patient ID: Vanessa Craig, female   DOB: 08-09-1928, 84 y.o.   MRN: [id number]   Reason for Appointment: Diabetes follow-up   History of Present Illness   Diagnosis: Type 2 DIABETES MELITUS, date of diagnosis: 1995        She has had mild and usually well-controlled diabetes for several years Previously on metformin and this was stopped because of renal dysfunction Also has been tried on low dose basal insulin previously However with using GLP-1 drugs like Byetta and Victoza her blood sugars have been fairly well controlled She has been on Victoza since 2011   Because of significantly high readings including over 300 in October 2017 she was started on low-dose premixed insulin at suppertime  Recent history:  Non-insulin hypoglycemic drugs:..  Prandin 2 mg before breakfast, 2 mg at lunch, 2 at supper.     INSULIN regimen: NovoLog 70/30, 8 units a.m., 10 units at suppertime  Her A1c on her last visit was 8.4    Current management, blood sugar patterns and problems identified:  She was told to increase her evening insulin to 10 units on her last visit when her morning sugars were relatively higher and more consistently so  However because of her edema she was told to stop Actos and increase her Prandin  Her daughter thinks that her blood sugar started going up subsequently and especially in the last month have been higher  Recently blood sugars averaging nearly 300 at home  Despite this she does not complain of increased thirst or urination or any unusual fatigue  Her weight has gone down about 6 pounds  Does not think that she has any decrease in her appetite currently  Has been apparently consistent with taking her insulin regularly   Hypoglycemia: None  Side effects from medications: Nausea from Victoza  Monitors blood glucose:  about once a day or less .    Glucometer: One Touch Verio      Blood Glucose readings    Mean values apply above for all  meters except median for One Touch  PRE-MEAL Fasting Lunch Dinner Bedtime Overall  Glucose range:  186-295   223-601    Mean/median:  228  311  327   296+/-83   POST-MEAL PC Breakfast PC Lunch PC Dinner  Glucose range:    287-342  Mean/median:      PREVIOUS readings   PRE-MEAL Fasting Lunch Dinner Bedtime Overall  Glucose range:  150-202   142-267    Mean/median:   157  232  188   POST-MEAL PC Breakfast PC Lunch PC Dinner  Glucose range:  153, 250   184-312  Mean/median:    219      Meals: 3 meals per day. Dinner 6-7pm  Special K Cereal or raisin bran Sometimes with cheese in am for breakfast or rarely eggs, variable carbohydrate intake at other meals         Physical activity: exercise: Currently not walking  Dietician visit: Most recent: Years ago          Wt Readings from Last 3 Encounters:  06/28/18 174 lb 12.8 oz (79.3 kg)  04/26/18 180 lb 6.4 oz (81.8 kg)  03/13/18 176 lb (79.8 kg)    Lab Results  Component Value Date   HGBA1C 8.4 (A) 04/26/2018   HGBA1C 9.1 01/18/2018   HGBA1C 7.7 09/25/2017   Lab Results  Component Value Date   MICROALBUR 8.4 (H) 01/18/2018   LDLCALC 125 (H) 04/26/2018  CREATININE 1.71 (H) 04/26/2018     OTHER active problems: See review of systems   No visits with results within 1 Week(s) from this visit.  Latest known visit with results is:  Office Visit on 04/26/2018  Component Date Value Ref Range Status  . Hemoglobin A1C 04/26/2018 8.4* 4.0 - 5.6 % Final  . Glucose Fasting, POC 04/26/2018 65* 70 - 99 mg/dL Final  . Cholesterol 04/26/2018 203* 0 - 200 mg/dL Final   ATP III Classification       Desirable:  < 200 mg/dL               Borderline High:  200 - 239 mg/dL          High:  > = 240 mg/dL  . Triglycerides 04/26/2018 166.0* 0.0 - 149.0 mg/dL Final   Normal:  <150 mg/dLBorderline High:  150 - 199 mg/dL  . HDL 04/26/2018 45.30  >39.00 mg/dL Final  . VLDL 04/26/2018 33.2  0.0 - 40.0 mg/dL Final  . LDL Cholesterol  04/26/2018 125* 0 - 99 mg/dL Final  . Total CHOL/HDL Ratio 04/26/2018 4   Final                  Men          Women1/2 Average Risk     3.4          3.3Average Risk          5.0          4.42X Average Risk          9.6          7.13X Average Risk          15.0          11.0                      . NonHDL 04/26/2018 157.74   Final   NOTE:  Non-HDL goal should be 30 mg/dL higher than patient's LDL goal (i.e. LDL goal of < 70 mg/dL, would have non-HDL goal of < 100 mg/dL)  . Sodium 04/26/2018 140  135 - 145 mEq/L Final  . Potassium 04/26/2018 4.1  3.5 - 5.1 mEq/L Final  . Chloride 04/26/2018 104  96 - 112 mEq/L Final  . CO2 04/26/2018 29  19 - 32 mEq/L Final  . Glucose, Bld 04/26/2018 57* 70 - 99 mg/dL Final  . BUN 04/26/2018 23  6 - 23 mg/dL Final  . Creatinine, Ser 04/26/2018 1.71* 0.40 - 1.20 mg/dL Final  . Calcium 04/26/2018 9.7  8.4 - 10.5 mg/dL Final  . GFR 04/26/2018 29.92* >60.00 mL/min Final    Allergies as of 06/28/2018      Reactions   Codeine Nausea And Vomiting      Medication List        Accurate as of 06/28/18 10:48 AM. Always use your most recent med list.          amLODipine 10 MG tablet Commonly known as:  NORVASC Take 1 tablet (10 mg total) by mouth daily.   aspirin 81 MG tablet Take 81 mg by mouth at bedtime.   B-D ULTRAFINE III SHORT PEN 31G X 8 MM Misc Generic drug:  Insulin Pen Needle Use 2 daily   carvedilol 6.25 MG tablet Commonly known as:  COREG Take 0.5 tablets (3.125 mg total) by mouth 2 (two) times daily with a meal. Do not take the second if HR <55  BPM   Fish Oil 1000 MG Caps Take 1,000 mg by mouth daily.   furosemide 20 MG tablet Commonly known as:  LASIX Take 20 mg by mouth. TAKE 1 TABLET BY MOUTH DAILY FOR ANKLE SWELLING.   glucose blood test strip Commonly known as:  ACCU-CHEK AVIVA PLUS Use to check blood sugar twice daily.   guanFACINE 2 MG tablet Commonly known as:  TENEX Take 2 mg by mouth at bedtime.   insulin aspart protamine  - aspart (70-30) 100 UNIT/ML FlexPen Commonly known as:  NOVOLOG MIX 70/30 FLEXPEN Inject 0.08 mLs (8 Units total) into the skin 2 (two) times daily with a meal.   lansoprazole 15 MG capsule Commonly known as:  PREVACID TAKE 1 CAPSULE BY MOUTH FOR STOMACH ACID ONCE A DAY IN THE MORNINGS   meclizine 12.5 MG tablet Commonly known as:  ANTIVERT Take 12.5 mg by mouth daily as needed for dizziness.   multivitamin with minerals Tabs tablet Take 1 tablet by mouth daily.   raloxifene 60 MG tablet Commonly known as:  EVISTA TAKE 1 TABLET BY MOUTH EVERY DAY   ramipril 10 MG capsule Commonly known as:  ALTACE Take 1 capsule (10 mg total) by mouth 2 (two) times daily. Do not take second-PM dose if systolic < 993   ranitidine 300 MG tablet Commonly known as:  ZANTAC TAKE 1 TABLET BY MOUTH EVERYDAY AT BEDTIME   repaglinide 1 MG tablet Commonly known as:  PRANDIN 1 tablet before breakfast, 2 tablets before lunch and dinner   rivastigmine 4.6 mg/24hr Commonly known as:  EXELON Place 4.6 mg onto the skin daily.   rosuvastatin 10 MG tablet Commonly known as:  CRESTOR Take 1 tablet (10 mg total) by mouth daily.   traZODone 50 MG tablet Commonly known as:  DESYREL Take 50 mg by mouth at bedtime as needed for sleep.   Vitamin D3 3000 units Tabs Take 1 capsule by mouth at bedtime.       Allergies:  Allergies  Allergen Reactions  . Codeine Nausea And Vomiting    Past Medical History:  Diagnosis Date  . Chronic kidney disease, stage III (moderate) ([place]) 08/06/2013  . Dementia   . Diabetes mellitus without complication ([place])   . DM (diabetes mellitus) ([place]) 07/31/2013  . Esophageal reflux 03/08/2018  . Essential hypertension 08/24/2016  . Hypertension   . Obesity 03/08/2018  . Other and unspecified hyperlipidemia 08/06/2013  . PUD (peptic ulcer disease) 03/08/2018  . Sinus bradycardia 03/08/2018  . Unspecified essential hypertension 08/04/2013    Past Surgical History:  Procedure  Laterality Date  . APPENDECTOMY      Family History  Problem Relation Age of Onset  . Hypertension Mother   . Heart disease Father   . Heart attack Father     Social History:  reports that she has never smoked. She has never used smokeless tobacco. She reports that she does not drink alcohol or use drugs.  Review of Systems:  THYROID NODULE:  She apparently had a CT scan of her chest and incidentally was noted to have a thyroid nodule ULTRASOUND of thyroid was done by her PCP and showed a 3.4 cm mostly solid isoechoic nodule, this was done on 05/29/2018 Has no history of thyroid nodules in the past and no local symptoms of pressure or choking and no difficulty swallowing     Osteopenia: She is on Evista from her PCP   HYPERTENSION:  she has had long-standing hypertension treated with multiple  drugs She is taking Lasix now in addition to other medications and HCTZ was stopped because of edema   Blood pressure also normal when checked at home, usually about 568 systolic according to her daughter  BP Readings from Last 3 Encounters:  06/28/18 122/70  04/26/18 138/74  03/13/18 (!) 102/52   EDEMA: She has had some edema persisting but better with taking Lasix now, her PCP recently  Renal dysfunction: She has had abnormal renal function but this has been  stable for several years   Lab Results  Component Value Date   CREATININE 1.71 (H) 04/26/2018    HYPERLIPIDEMIA: The lipid abnormality consists of elevated LDL and high triglycerides  She is taking pravastatin 80 mg and fenofibrate 160 mg daily   Lab Results  Component Value Date   CHOL 203 (H) 04/26/2018   HDL 45.30 04/26/2018   LDLCALC 125 (H) 04/26/2018   LDLDIRECT 85.0 09/25/2017   TRIG 166.0 (H) 04/26/2018   CHOLHDL 4 04/26/2018     Examination:   BP 122/70 (BP Location: Left Arm, Patient Position: Sitting, Cuff Size: Normal)   Pulse (!) 59   Ht 5\' 5"  (1.651 m)   Wt 174 lb 12.8 oz (79.3 kg)   SpO2 97%    BMI 29.09 kg/m   Body mass index is 29.09 kg/m.   1 + ankle edema present  Thyroid nodule is just palpable on swallowing in the suprasternal notch, smooth and firm, difficult to assess the size since only superior margin is felt No lymphadenopathy   ASSESSMENT/ PLAN:   Diabetes type 2 long-standing  See history of present illness for detailed discussion of  current management, blood sugar patterns and problems identified  Her last A1c was 8.4  However with stopping Actos her blood sugars are progressively higher now This was even with taking 15 mg Actos Her edema was not improved with stopping Actos however  Since her blood sugars are mostly higher in the afternoon likely needs at least 4 units more insulin in the morning Currently morning sugars are mostly over 200 and probably also has high readings after supper Again because of her age and dementia she will need to be continued only on premixed insulin for convenience  A1c has improved slightly to 8.4 which may be adequate for her age and concomitant problems  With a long history of diabetes she appears to be more insulin deficient also   Recommendations:  She will take 14 units of insulin twice a day  Discussed checking blood sugars at various times including more around lunchtime or bedtime  Will reassess her insulin requirement by phone and her daughter will call in the readings  She will restart Actos 15 mg daily  Continue Prandin unchanged  Her daughter will remind her to take her Prandin before lunch   THYROID nodule: Although this is relatively large it is isoechoic and has no other suspicious characteristics Considering her age and nature of thyroid cancer in general we do not need to do a biopsy at this time; patient is at relatively high risk for surgery and is reluctant to consider potential surgery also Will do an ultrasound again in 6 months   HYPERTENSION: Blood pressure is controlled but slightly  lower than before with adding Lasix Because of continued edema she can try taking only 5 mg of Norvasc, she does have a blood pressure checked at home by her daughter daily   Edema: Continue Lasix and follow-up with PCP  LIPIDS: Needs  follow-up, consider changing pravastatin because of high LDL  Total visit time for evaluation and management of multiple problems and counseling =25 minutes   There are no Patient Instructions on file for this visit.   Elayne Snare 06/28/2018, 10:48 AM

## 2018-06-28 NOTE — Patient Instructions (Addendum)
Insulin 14 units am and 14 in pm  Amlodipine 1/2 daily

## 2018-06-29 NOTE — Progress Notes (Signed)
Please let her daughter know that the thyroid tests are okay, kidney test slightly better and no further action needed.  Labs have been sent to PCP

## 2018-07-25 ENCOUNTER — Other Ambulatory Visit: Payer: Self-pay | Admitting: Endocrinology

## 2018-07-29 ENCOUNTER — Other Ambulatory Visit: Payer: Self-pay | Admitting: Endocrinology

## 2018-08-06 ENCOUNTER — Other Ambulatory Visit: Payer: Self-pay | Admitting: Emergency Medicine

## 2018-08-06 MED ORDER — GLUCOSE BLOOD VI STRP: ORAL_STRIP | 5 refills | Status: DC

## 2018-08-15 ENCOUNTER — Other Ambulatory Visit: Payer: Self-pay

## 2018-08-15 DIAGNOSIS — R600 Localized edema: Secondary | ICD-10-CM | POA: Diagnosis not present

## 2018-08-15 DIAGNOSIS — E1142 Type 2 diabetes mellitus with diabetic polyneuropathy: Secondary | ICD-10-CM | POA: Diagnosis not present

## 2018-08-15 DIAGNOSIS — B351 Tinea unguium: Secondary | ICD-10-CM | POA: Diagnosis not present

## 2018-08-15 DIAGNOSIS — L600 Ingrowing nail: Secondary | ICD-10-CM | POA: Diagnosis not present

## 2018-08-15 MED ORDER — INSULIN ASPART PROT & ASPART (70-30 MIX) 100 UNIT/ML PEN: PEN_INJECTOR | SUBCUTANEOUS | 3 refills | Status: DC

## 2018-08-20 ENCOUNTER — Ambulatory Visit: Payer: Medicare Other | Admitting: Endocrinology

## 2018-08-23 ENCOUNTER — Encounter: Payer: Self-pay | Admitting: Endocrinology

## 2018-08-23 ENCOUNTER — Ambulatory Visit (INDEPENDENT_AMBULATORY_CARE_PROVIDER_SITE_OTHER): Payer: Medicare Other | Admitting: Endocrinology

## 2018-08-23 VITALS — BP 138/74 | HR 64 | Ht 64.0 in | Wt 181.0 lb

## 2018-08-23 DIAGNOSIS — E782 Mixed hyperlipidemia: Secondary | ICD-10-CM | POA: Diagnosis not present

## 2018-08-23 DIAGNOSIS — Z794 Long term (current) use of insulin: Secondary | ICD-10-CM | POA: Diagnosis not present

## 2018-08-23 DIAGNOSIS — E1165 Type 2 diabetes mellitus with hyperglycemia: Secondary | ICD-10-CM | POA: Diagnosis not present

## 2018-08-23 LAB — LIPID PANEL
CHOL/HDL RATIO: 5
Cholesterol: 166 mg/dL (ref 0–200)
HDL: 36.7 mg/dL — AB (ref >39.00)
NONHDL: 129.03
Triglycerides: 379 mg/dL — ABNORMAL HIGH (ref 0.0–149.0)
VLDL: 75.8 mg/dL — AB (ref 0.0–40.0)

## 2018-08-23 LAB — COMPREHENSIVE METABOLIC PANEL
ALT: 19 U/L (ref 0–35)
AST: 16 U/L (ref 0–37)
Albumin: 3.7 g/dL (ref 3.5–5.2)
Alkaline Phosphatase: 72 U/L (ref 39–117)
BUN: 26 mg/dL — AB (ref 6–23)
CO2: 30 meq/L (ref 19–32)
Calcium: 9.2 mg/dL (ref 8.4–10.5)
Chloride: 103 mEq/L (ref 96–112)
Creatinine, Ser: 1.58 mg/dL — ABNORMAL HIGH (ref 0.40–1.20)
GFR: 32.75 mL/min — AB (ref >60.00)
GLUCOSE: 263 mg/dL — AB (ref 70–99)
POTASSIUM: 4.7 meq/L (ref 3.5–5.1)
SODIUM: 138 meq/L (ref 135–145)
Total Bilirubin: 0.6 mg/dL (ref 0.2–1.2)
Total Protein: 6.3 g/dL (ref 6.0–8.3)

## 2018-08-23 LAB — POCT GLYCOSYLATED HEMOGLOBIN (HGB A1C): HEMOGLOBIN A1C: 9.3 % — AB (ref 4.0–5.6)

## 2018-08-23 LAB — LDL CHOLESTEROL, DIRECT: LDL DIRECT: 83 mg/dL

## 2018-08-23 MED ORDER — INSULIN DEGLUDEC 100 UNIT/ML ~~LOC~~ SOPN: 10.0000 [IU] | PEN_INJECTOR | Freq: Every day | SUBCUTANEOUS | 1 refills | Status: DC

## 2018-08-23 MED ORDER — PIOGLITAZONE HCL 15 MG PO TABS: 15.0000 mg | ORAL_TABLET | Freq: Every day | ORAL | 1 refills | Status: DC

## 2018-08-23 NOTE — Progress Notes (Signed)
Patient ID: Vanessa Craig, female   DOB: 1928-05-06, 84 y.o.   MRN: [id number]   Reason for Appointment: Diabetes follow-up   History of Present Illness   Diagnosis: Type 2 DIABETES MELITUS, date of diagnosis: 1995        She has had mild and usually well-controlled diabetes for several years Previously on metformin and this was stopped because of renal dysfunction Also has been tried on low dose basal insulin previously However with using GLP-1 drugs like Byetta and Victoza her blood sugars have been fairly well controlled She has been on Victoza since 2011   Because of significantly high readings including over 300 in October 2017 she was started on low-dose premixed insulin at suppertime  Recent history:  Non-insulin hypoglycemic drugs:..  Prandin 2 mg before breakfast, 2 mg at lunch, 2 at supper.     INSULIN regimen: NovoLog 70/30, 14 units a.m., 14 units at suppertime  Her A1c on her last visit was 8.4 and now 9.3    Current management, blood sugar patterns and problems identified:  She was told to increase her insulin doses to 14 units twice daily along with restarting Actos on the last visit because of her blood sugars averaging nearly 300 at home  Although her blood sugars are relatively better they are still as high as 357 at home  Her daughter is also concerned about her episode of low blood sugar on a Sunday midday when she apparently only had a toast and no other food in the morning before breakfast  Despite increasing her insulin her FASTING blood sugars are averaging well over 200  Also most of her readings the rest of the day are over 200  This is despite usually eating small portions although may be somewhat irregular with her meals and portion sizes  Her daughter make sure that she is taking her insulin and is writing down the doses to be taken  No significant increase in edema with restarting Actos   Hypoglycemia: None  Side effects from  medications: Nausea from [place] blood glucose:  about once a day or less .    Glucometer: One Touch Verio      Blood Glucose readings    PRE-MEAL Fasting Lunch Dinner Bedtime Overall  Glucose range:  167-306  177-337  150-271    Mean/median:   221    231+/-2 5   POST-MEAL PC Breakfast PC Lunch PC Dinner  Glucose range:    138-357  Mean/median:       Mean values apply above for all meters except median for One Touch  PRE-MEAL Fasting Lunch Dinner Bedtime Overall  Glucose range:  186-295   223-601    Mean/median:  228  311  327   296+/-83   POST-MEAL PC Breakfast PC Lunch PC Dinner  Glucose range:    287-342  Mean/median:       Meals: 3 meals per day. Dinner 6-7pm  Special K Cereal or raisin bran, rarely eggs, variable carbohydrate intake at other meals         Physical activity: exercise:  some walking on her driveway  Dietician visit: Most recent: Years ago          Wt Readings from Last 3 Encounters:  08/23/18 181 lb (82.1 kg)  06/28/18 174 lb 12.8 oz (79.3 kg)  04/26/18 180 lb 6.4 oz (81.8 kg)    Lab Results  Component Value Date   HGBA1C 9.3 (A) 08/23/2018  HGBA1C 8.4 (A) 04/26/2018   HGBA1C 9.1 01/18/2018   Lab Results  Component Value Date   MICROALBUR 8.4 (H) 01/18/2018   LDLCALC 125 (H) 04/26/2018   CREATININE 1.54 (H) 06/28/2018     OTHER active problems: See review of systems   Office Visit on 08/23/2018  Component Date Value Ref Range Status  . Hemoglobin A1C 08/23/2018 9.3* 4.0 - 5.6 % Final    Allergies as of 08/23/2018      Reactions   Codeine Nausea And Vomiting      Medication List        Accurate as of 08/23/18  9:48 AM. Always use your most recent med list.          amLODipine 10 MG tablet Commonly known as:  NORVASC Take 1 tablet (10 mg total) by mouth daily.   aspirin 81 MG tablet Take 81 mg by mouth at bedtime.   B-D ULTRAFINE III SHORT PEN 31G X 8 MM Misc Generic drug:  Insulin Pen Needle Use 2 daily     carvedilol 6.25 MG tablet Commonly known as:  COREG Take 0.5 tablets (3.125 mg total) by mouth 2 (two) times daily with a meal. Do not take the second if HR <55 BPM   Fish Oil 1000 MG Caps Take 1,000 mg by mouth daily.   furosemide 20 MG tablet Commonly known as:  LASIX Take 20 mg by mouth. TAKE 1 TABLET BY MOUTH DAILY FOR ANKLE SWELLING.   glucose blood test strip Use to check blood sugar twice daily.   glucose blood test strip Use to test blood sugars twice daily   guanFACINE 2 MG tablet Commonly known as:  TENEX Take 2 mg by mouth at bedtime.   insulin aspart protamine - aspart (70-30) 100 UNIT/ML FlexPen Commonly known as:  NOVOLOG 70/30 MIX Inject 14 units with breakfast and supper   lansoprazole 15 MG capsule Commonly known as:  PREVACID TAKE 1 CAPSULE BY MOUTH FOR STOMACH ACID ONCE A DAY IN THE MORNINGS   meclizine 12.5 MG tablet Commonly known as:  ANTIVERT Take 12.5 mg by mouth daily as needed for dizziness.   multivitamin with minerals Tabs tablet Take 1 tablet by mouth daily.   pioglitazone 15 MG tablet Commonly known as:  ACTOS Take 1 tablet (15 mg total) by mouth daily.   raloxifene 60 MG tablet Commonly known as:  EVISTA TAKE 1 TABLET BY MOUTH EVERY DAY   ramipril 10 MG capsule Commonly known as:  ALTACE Take 1 capsule (10 mg total) by mouth 2 (two) times daily. Do not take second-PM dose if systolic < 782   ranitidine 300 MG tablet Commonly known as:  ZANTAC TAKE 1 TABLET BY MOUTH EVERYDAY AT BEDTIME   repaglinide 1 MG tablet Commonly known as:  PRANDIN TAKE 1 TABLET BEFORE BREAKFAST, 2 TABLETS BEFORE LUNCH AND DINNER   rivastigmine 4.6 mg/24hr Commonly known as:  EXELON Place 4.6 mg onto the skin daily.   rosuvastatin 10 MG tablet Commonly known as:  CRESTOR TAKE 1 TABLET BY MOUTH EVERY DAY   traZODone 50 MG tablet Commonly known as:  DESYREL Take 50 mg by mouth at bedtime as needed for sleep.   Vitamin D3 3000 units Tabs Take 1  capsule by mouth at bedtime.       Allergies:  Allergies  Allergen Reactions  . Codeine Nausea And Vomiting    Past Medical History:  Diagnosis Date  . Chronic kidney disease, stage III (moderate) (HCC)  08/06/2013  . Dementia   . Diabetes mellitus without complication ([place])   . DM (diabetes mellitus) ([place]) 07/31/2013  . Esophageal reflux 03/08/2018  . Essential hypertension 08/24/2016  . Hypertension   . Obesity 03/08/2018  . Other and unspecified hyperlipidemia 08/06/2013  . PUD (peptic ulcer disease) 03/08/2018  . Sinus bradycardia 03/08/2018  . Unspecified essential hypertension 08/04/2013    Past Surgical History:  Procedure Laterality Date  . APPENDECTOMY      Family History  Problem Relation Age of Onset  . Hypertension Mother   . Heart disease Father   . Heart attack Father     Social History:  reports that she has never smoked. She has never used smokeless tobacco. She reports that she does not drink alcohol or use drugs.  Review of Systems:  THYROID NODULE:  She apparently had a CT scan of her chest and incidentally was noted to have a thyroid nodule ULTRASOUND of thyroid was done by her PCP and showed a 3.4 cm mostly solid isoechoic nodule, this was done on 05/29/2018     Osteopenia: She is on Evista from her PCP   HYPERTENSION:  she has had long-standing hypertension treated with multiple drugs She was told to take 1/2 tablet of Norvasc on the last visit  BP Readings from Last 3 Encounters:  08/23/18 138/74  06/28/18 122/70  04/26/18 138/74   EDEMA: She has had some residual edema but continues to take Lasix  Renal dysfunction: She has had abnormal renal function but this has been  stable for several years   Lab Results  Component Value Date   CREATININE 1.54 (H) 06/28/2018    HYPERLIPIDEMIA: The lipid abnormality consists of elevated LDL and high triglycerides  She is taking Crestor now instead of pravastatin 80 mg and fenofibrate 160 mg  daily Previously her LDL was well over 100   Lab Results  Component Value Date   CHOL 203 (H) 04/26/2018   HDL 45.30 04/26/2018   LDLCALC 125 (H) 04/26/2018   LDLDIRECT 85.0 09/25/2017   TRIG 166.0 (H) 04/26/2018   CHOLHDL 4 04/26/2018     Examination:   BP 138/74   Pulse 64   Ht 5\' 4"  (1.626 m)   Wt 181 lb (82.1 kg)   BMI 31.07 kg/m   Body mass index is 31.07 kg/m.   1 + lower leg edema present   ASSESSMENT/ PLAN:   Diabetes type 2 long-standing  See history of present illness for detailed discussion of  current management, blood sugar patterns and problems identified  Her A1c has gone up to 9.3  Home blood sugars are averaging over 200 although somewhat better with increasing her insulin and restarting Actos Since she is having occasional low normal sugars at bedtime and has one episode of low blood sugar at lunchtime would not like to increase her insulin doses any further  Recommendations:  She will take [place] as a basal insulin and start with 6 units  Given her daughter a flowsheet to titrate this every 3 days by 1 unit to get morning sugars down to about 150 or below  She will reduce her NovoLog insulin to 12 units for now  Continue Prandin and Actos  14 units of insulin twice a day  Discussed adding a protein to breakfast consistently especially if she is going out in the morning  Hypertension: Fairly well controlled now    LIPIDS: Needs follow-up with switching to Crestor   Patient Instructions  Add  protein in am   12 Units 2x daily  Tresiba 6 units daily in pm    Elayne Snare 08/23/2018, 9:48 AM

## 2018-08-23 NOTE — Patient Instructions (Signed)
Add protein in am   12 Units 2x daily  Tresiba 6 units daily in pm

## 2018-08-28 ENCOUNTER — Ambulatory Visit: Payer: Medicare Other | Admitting: Cardiology

## 2018-08-29 NOTE — Progress Notes (Signed)
Cardiology Office Note:    Date:  08/30/2018   ID:  Vanessa Craig, DOB [birth year], MRN [id number]  PCP:  Ernestene Kiel, MD  Cardiologist:  Shirlee More, MD    Referring MD: Ernestene Kiel, MD    ASSESSMENT:    1. Hypertensive kidney disease with stage 3 chronic kidney disease ([place])   2. Sinus bradycardia    PLAN:    In order of problems listed above:  1. Improved blood pressure at target no further episodes of orthostatic hypotension continue current treatment 2. Improved no bradycardia on current beta-blocker and exelon 3. Dyslipidemia stable continue high intensity statin 4. Diabetes poorly controlled managed by her PCP   Next appointment: As needed   Medication Adjustments/Labs and Tests Ordered: Current medicines are reviewed at length with the patient today.  Concerns regarding medicines are outlined above.  No orders of the defined types were placed in this encounter.  No orders of the defined types were placed in this encounter.   No chief complaint on file.   History of Present Illness:    Vanessa Craig is a 84 y.o. female with a hx of hypertension, CKD, T2 DM, and sinus bradycardia  last seen 03/13/18. Compliance with diet, lifestyle and medications: Yes  With adjustment of medication she has no further episodes of hypotension or bradycardia is supervised by her daughter.  No episodes of syncope palpitation chest pain shortness of breath.  Recent labs done at her PCP office 08/23/2018 creatinine 1.58 A1c 9.3 cholesterol 166 HDL 37 LDL 83 Past Medical History:  Diagnosis Date  . Chronic kidney disease, stage III (moderate) ([place]) 08/06/2013  . Dementia ([place])   . Diabetes mellitus without complication ([place])   . DM (diabetes mellitus) ([place]) 07/31/2013  . Esophageal reflux 03/08/2018  . Essential hypertension 08/24/2016  . Hypertension   . Obesity 03/08/2018  . Other and unspecified hyperlipidemia 08/06/2013  . PUD (peptic ulcer disease) 03/08/2018    . Sinus bradycardia 03/08/2018  . Unspecified essential hypertension 08/04/2013    Past Surgical History:  Procedure Laterality Date  . APPENDECTOMY      Current Medications: Current Meds  Medication Sig  . amLODipine (NORVASC) 10 MG tablet Take 1 tablet (10 mg total) by mouth daily. (Patient taking differently: Take 5 mg by mouth daily. )  . aspirin 81 MG tablet Take 81 mg by mouth at bedtime.   . B-D ULTRAFINE III SHORT PEN 31G X 8 MM MISC Use 2 daily  . carvedilol (COREG) 6.25 MG tablet Take 0.5 tablets (3.125 mg total) by mouth 2 (two) times daily with a meal. Do not take the second if HR <55 BPM  . Cholecalciferol (VITAMIN D3) 3000 UNITS TABS Take 1 capsule by mouth at bedtime.   . furosemide (LASIX) 20 MG tablet Take 20 mg by mouth. TAKE 1 TABLET BY MOUTH DAILY FOR ANKLE SWELLING.  Mar[place] Kitchen glucose blood (ACCU-CHEK AVIVA PLUS) test strip Use to check blood sugar twice daily.  Marland Kitchen glucose blood (ONETOUCH VERIO) test strip Use to test blood sugars twice daily  . guanFACINE (TENEX) 2 MG tablet Take 2 mg by mouth at bedtime.   . insulin aspart protamine - aspart (NOVOLOG MIX 70/30 FLEXPEN) (70-30) 100 UNIT/ML FlexPen Inject 14 units with breakfast and supper  . insulin degludec (TRESIBA FLEXTOUCH) 100 UNIT/ML SOPN FlexTouch Pen Inject 0.1 mLs (10 Units total) into the skin daily.  . lansoprazole (PREVACID) 15 MG capsule TAKE 1 CAPSULE BY MOUTH FOR STOMACH ACID ONCE A DAY IN  THE MORNINGS  . meclizine (ANTIVERT) 12.5 MG tablet Take 12.5 mg by mouth daily as needed for dizziness.   . Multiple Vitamin (MULTIVITAMIN WITH MINERALS) TABS tablet Take 1 tablet by mouth daily.  . Omega-3 Fatty Acids (FISH OIL) 1000 MG CAPS Take 1,000 mg by mouth daily.   . pioglitazone (ACTOS) 15 MG tablet Take 1 tablet (15 mg total) by mouth daily.  . raloxifene (EVISTA) 60 MG tablet TAKE 1 TABLET BY MOUTH EVERY DAY  . ramipril (ALTACE) 10 MG capsule Take 1 capsule (10 mg total) by mouth 2 (two) times daily. Do not take  second-PM dose if systolic < 706  . ranitidine (ZANTAC) 300 MG tablet TAKE 1 TABLET BY MOUTH EVERYDAY AT BEDTIME  . repaglinide (PRANDIN) 1 MG tablet TAKE 1 TABLET BEFORE BREAKFAST, 2 TABLETS BEFORE LUNCH AND DINNER (Patient taking differently: TAKE 1 TABLET BEFORE BREAKFAST,3 TABLETS BEFORE LUNCH AND 2 BEFORE DINNER)  . rivastigmine (EXELON) 4.6 mg/24hr Place 4.6 mg onto the skin daily.  . rosuvastatin (CRESTOR) 10 MG tablet TAKE 1 TABLET BY MOUTH EVERY DAY  . traZODone (DESYREL) 50 MG tablet Take 50 mg by mouth at bedtime as needed for sleep.      Allergies:   Codeine   Social History   Socioeconomic History  . Marital status: Unknown    Spouse name: Not on file  . Number of children: Not on file  . Years of education: Not on file  . Highest education level: Not on file  Occupational History  . Not on file  Social Needs  . Financial resource strain: Not on file  . Food insecurity:    Worry: Not on file    Inability: Not on file  . Transportation needs:    Medical: Not on file    Non-medical: Not on file  Tobacco Use  . Smoking status: Never Smoker  . Smokeless tobacco: Never Used  Substance and Sexual Activity  . Alcohol use: No  . Drug use: Never  . Sexual activity: Not on file  Lifestyle  . Physical activity:    Days per week: Not on file    Minutes per session: Not on file  . Stress: Not on file  Relationships  . Social connections:    Talks on phone: Not on file    Gets together: Not on file    Attends religious service: Not on file    Active member of club or organization: Not on file    Attends meetings of clubs or organizations: Not on file    Relationship status: Not on file  Other Topics Concern  . Not on file  Social History Narrative  . Not on file     Family History: The patient's family history includes Heart attack in her father; Heart disease in her father; Hypertension in her mother. ROS:   Please see the history of present illness.    All  other systems reviewed and are negative.  EKGs/Labs/Other Studies Reviewed:    The following studies were reviewed today:  EKG:  EKG ordered today.  The ekg ordered today demonstrates sinus bradycardia normal  Recent Labs: 06/28/2018: TSH 2.82 08/23/2018: ALT 19; BUN 26; Creatinine, Ser 1.58; Potassium 4.7; Sodium 138  Recent Lipid Panel    Component Value Date/Time   CHOL 166 08/23/2018 1013   TRIG 379.0 (H) 08/23/2018 1013   HDL 36.70 (L) 08/23/2018 1013   CHOLHDL 5 08/23/2018 1013   VLDL 75.8 (H) 08/23/2018 1013   LDLCALC  125 (H) 04/26/2018 1122   LDLDIRECT 83.0 08/23/2018 1013    Physical Exam:    VS:  BP 132/70 (BP Location: Right Arm, Patient Position: Sitting, Cuff Size: Large)   Pulse (!) 55   Ht 5\' 4"  (1.626 m)   Wt 181 lb (82.1 kg)   SpO2 97%   BMI 31.07 kg/m     Wt Readings from Last 3 Encounters:  08/30/18 181 lb (82.1 kg)  08/23/18 181 lb (82.1 kg)  06/28/18 174 lb 12.8 oz (79.3 kg)     GEN:  Well nourished, well developed in no acute distress HEENT: Normal NECK: No JVD; No carotid bruits LYMPHATICS: No lymphadenopathy CARDIAC: RRR, no murmurs, rubs, gallops RESPIRATORY:  Clear to auscultation without rales, wheezing or rhonchi  ABDOMEN: Soft, non-tender, non-distended MUSCULOSKELETAL:  No edema; No deformity  SKIN: Warm and dry NEUROLOGIC:  Alert and oriented x 3 PSYCHIATRIC:  Normal affect    Signed, Shirlee More, MD  08/30/2018 10:39 AM    [place]

## 2018-08-30 ENCOUNTER — Ambulatory Visit (INDEPENDENT_AMBULATORY_CARE_PROVIDER_SITE_OTHER): Payer: Medicare Other | Admitting: Cardiology

## 2018-08-30 ENCOUNTER — Encounter: Payer: Self-pay | Admitting: Cardiology

## 2018-08-30 VITALS — BP 132/70 | HR 55 | Ht 64.0 in | Wt 181.0 lb

## 2018-08-30 DIAGNOSIS — R001 Bradycardia, unspecified: Secondary | ICD-10-CM

## 2018-08-30 DIAGNOSIS — N183 Chronic kidney disease, stage 3 unspecified: Secondary | ICD-10-CM

## 2018-08-30 DIAGNOSIS — I129 Hypertensive chronic kidney disease with stage 1 through stage 4 chronic kidney disease, or unspecified chronic kidney disease: Secondary | ICD-10-CM

## 2018-08-30 NOTE — Patient Instructions (Signed)
Medication Instructions:  Your physician recommends that you continue on your current medications as directed. Please refer to the Current Medication list given to you today.   Labwork: None  Testing/Procedures: You had an EKG today.   Follow-Up: Your physician recommends that you schedule a follow-up appointment as needed if symptoms worsen or fail to improve.   If you need a refill on your cardiac medications before your next appointment, please call your pharmacy.   Thank you for choosing [facility]! Robyne Peers, RN [phone]

## 2018-09-16 DIAGNOSIS — Z1331 Encounter for screening for depression: Secondary | ICD-10-CM | POA: Diagnosis not present

## 2018-09-16 DIAGNOSIS — Z0001 Encounter for general adult medical examination with abnormal findings: Secondary | ICD-10-CM | POA: Diagnosis not present

## 2018-09-16 DIAGNOSIS — Z23 Encounter for immunization: Secondary | ICD-10-CM | POA: Diagnosis not present

## 2018-09-16 DIAGNOSIS — E669 Obesity, unspecified: Secondary | ICD-10-CM | POA: Diagnosis not present

## 2018-09-16 DIAGNOSIS — Z1339 Encounter for screening examination for other mental health and behavioral disorders: Secondary | ICD-10-CM | POA: Diagnosis not present

## 2018-09-16 DIAGNOSIS — Z6831 Body mass index (BMI) 31.0-31.9, adult: Secondary | ICD-10-CM | POA: Diagnosis not present

## 2018-09-16 DIAGNOSIS — Z1239 Encounter for other screening for malignant neoplasm of breast: Secondary | ICD-10-CM | POA: Diagnosis not present

## 2018-10-04 ENCOUNTER — Encounter: Payer: Self-pay | Admitting: Internal Medicine

## 2018-10-04 ENCOUNTER — Ambulatory Visit (INDEPENDENT_AMBULATORY_CARE_PROVIDER_SITE_OTHER): Payer: Medicare Other | Admitting: Internal Medicine

## 2018-10-04 VITALS — BP 158/68 | HR 74 | Resp 16 | Ht 64.0 in | Wt 180.0 lb

## 2018-10-04 DIAGNOSIS — E1165 Type 2 diabetes mellitus with hyperglycemia: Secondary | ICD-10-CM | POA: Diagnosis not present

## 2018-10-04 DIAGNOSIS — Z794 Long term (current) use of insulin: Secondary | ICD-10-CM | POA: Diagnosis not present

## 2018-10-04 MED ORDER — INSULIN ASPART 100 UNIT/ML FLEXPEN: 6.0000 [IU] | PEN_INJECTOR | Freq: Three times a day (TID) | SUBCUTANEOUS | 11 refills | Status: DC

## 2018-10-04 MED ORDER — BD PEN NEEDLE SHORT U/F 31G X 8 MM MISC: 2 refills | Status: DC

## 2018-10-04 NOTE — Progress Notes (Signed)
Name: Vanessa Craig  Age/ Sex: 84 y.o., female   MRN/ DOB: [id number], 04-Aug-1928     PCP: Ernestene Kiel, MD   Reason for Endocrinology Evaluation: Type 2 Diabetes Mellitus  Initial Endocrine Consultative Visit: 2014    PATIENT IDENTIFIER: [name] is a 84 y.o. female with a past medical history of . The patient has followed with Endocrinology clinic since 2014 for consultative assistance with management of her diabetes.  DIABETIC HISTORY:  [name] was diagnosed with T2DM in 1995, She has tried Metformin in the past, but this was discontinued due to renal function. She has been on Victoza since 2011. She was started on premix insulin in 2017 due to persistent hyperglycemia . Her hemoglobin A1c has ranged from 6.9% in 2015, peaking at 9.3% in 2019.   SUBJECTIVE:   During the last visit (08/23/18): a1c was 9.3% . She was restartedon actos 15 mg, insulin switched to tresiba 6 units, reduced the  novolog Mix at 12 units BID . Prandin cotinued on 2 mg with each meal.   Today (10/04/2018): [name] is here for a 6 week follow up on her diabetes. She is accompanied by her daughter Santiago Glad.  She checks her blood sugars 3 times daily. The patient has not had hypoglycemic episodes since the last clinic visit. Otherwise, the patient has not required any recent emergency interventions for hypoglycemia and has not had recent hospitalizations secondary to hyper or hypoglycemic episodes.    Pt used to be on actos in the past but caused LE edema, this time she was started at a smaller dose.  There's no hypoglycemia on the glucose meter readings, but daughter states, mother has two episodes of symptomatic hypoglycemia during church, in the past month, meter was not available at the time but the symptoms resolved with eating something.   Daughter is concerned that mother eats all the time and she is  not sure she is remembering to tak meds as prescribed. She sometimes forgets to take the prandin.     ROS: As per HPI and as detailed below: Review of Systems  Constitutional: Negative for fever and weight loss.  HENT: Negative for congestion and sore throat.   Respiratory: Negative for cough and shortness of breath.   Cardiovascular: Negative for chest pain and palpitations.      HOME DIABETES REGIMEN:  Tresiba 6 units supper time Novolog mix 14 units BID with Breakfast and Supper Prandin 2 with breakfast, 3 with lunch and 1 with supper     METER DOWNLOAD SUMMARY: Date range evaluated: 10/9-11/7/19 Fingerstick Blood Glucose Tests = 34 Overall Mean FS Glucose = 209 Standard Deviation = 64.0  BG Ranges: Low = 99 High = 385   Hypoglycemic Events/30 Days: BG < 50 = 0 Episodes of symptomatic severe hypoglycemia = 0    HISTORY:  Past Medical History:  Past Medical History:  Diagnosis Date  . Chronic kidney disease, stage III (moderate) ([place]) 08/06/2013  . Dementia ([place])   . Diabetes mellitus without complication ([place])   . DM (diabetes mellitus) ([place]) 07/31/2013  . Esophageal reflux 03/08/2018  . Essential hypertension 08/24/2016  . Hypertension   . Obesity 03/08/2018  . Other and unspecified hyperlipidemia 08/06/2013  . PUD (peptic ulcer disease) 03/08/2018  . Sinus bradycardia 03/08/2018  . Unspecified essential hypertension 08/04/2013   Past Surgical History:  Past Surgical History:  Procedure Laterality Date  . APPENDECTOMY      Social History:  reports that she has  never smoked. She has never used smokeless tobacco. She reports that she does not drink alcohol or use drugs. Family History:  Family History  Problem Relation Age of Onset  . Hypertension Mother   . Heart disease Father   . Heart attack Father      HOME MEDICATIONS: Allergies as of 10/04/2018      Reactions   Codeine Nausea And Vomiting      Medication List        Accurate as of 10/04/18   3:04 PM. Always use your most recent med list.          amLODipine 10 MG tablet Commonly known as:  NORVASC Take 1 tablet (10 mg total) by mouth daily.   aspirin 81 MG tablet Take 81 mg by mouth at bedtime.   B-D ULTRAFINE III SHORT PEN 31G X 8 MM Misc Generic drug:  Insulin Pen Needle Use 2 daily   carvedilol 6.25 MG tablet Commonly known as:  COREG Take 0.5 tablets (3.125 mg total) by mouth 2 (two) times daily with a meal. Do not take the second if HR <55 BPM   Fish Oil 1000 MG Caps Take 1,000 mg by mouth daily.   furosemide 20 MG tablet Commonly known as:  LASIX Take 20 mg by mouth. TAKE 1 TABLET BY MOUTH DAILY FOR ANKLE SWELLING.   glucose blood test strip Use to check blood sugar twice daily.   glucose blood test strip Use to test blood sugars twice daily   guanFACINE 2 MG tablet Commonly known as:  TENEX Take 2 mg by mouth at bedtime.   insulin aspart 100 UNIT/ML FlexPen Commonly known as:  NOVOLOG Inject 6 Units into the skin 3 (three) times daily with meals.   insulin degludec 100 UNIT/ML Sopn FlexTouch Pen Commonly known as:  TRESIBA Inject 0.1 mLs (10 Units total) into the skin daily.   lansoprazole 15 MG capsule Commonly known as:  PREVACID TAKE 1 CAPSULE BY MOUTH FOR STOMACH ACID ONCE A DAY IN THE MORNINGS   meclizine 12.5 MG tablet Commonly known as:  ANTIVERT Take 12.5 mg by mouth daily as needed for dizziness.   multivitamin with minerals Tabs tablet Take 1 tablet by mouth daily.   pioglitazone 15 MG tablet Commonly known as:  ACTOS Take 1 tablet (15 mg total) by mouth daily.   raloxifene 60 MG tablet Commonly known as:  EVISTA TAKE 1 TABLET BY MOUTH EVERY DAY   ramipril 10 MG capsule Commonly known as:  ALTACE Take 1 capsule (10 mg total) by mouth 2 (two) times daily. Do not take second-PM dose if systolic < 034   ranitidine 300 MG tablet Commonly known as:  ZANTAC TAKE 1 TABLET BY MOUTH EVERYDAY AT BEDTIME   rivastigmine 4.6  mg/24hr Commonly known as:  EXELON Place 4.6 mg onto the skin daily.   rosuvastatin 10 MG tablet Commonly known as:  CRESTOR TAKE 1 TABLET BY MOUTH EVERY DAY   traZODone 50 MG tablet Commonly known as:  DESYREL Take 50 mg by mouth at bedtime as needed for sleep.   Vitamin D3 75 MCG (3000 UT) Tabs Take 1 capsule by mouth at bedtime.        OBJECTIVE:   Vital Signs: BP (!) 158/68 (BP Location: Left Arm, Patient Position: Sitting, Cuff Size: Normal)   Pulse 74   Resp 16   Ht 5\' 4"  (1.626 m)   Wt 180 lb (81.6 kg)   SpO2 96%   BMI  30.90 kg/m   Wt Readings from Last 3 Encounters:  10/04/18 180 lb (81.6 kg)  08/30/18 181 lb (82.1 kg)  08/23/18 181 lb (82.1 kg)     Exam: General: Pt appears well and is in NAD  Hydration: Well-hydrated with moist mucous membranes and good skin turgor  HEENT: Head: Unremarkable with good dentition. Oropharynx clear without exudate.  Eyes: External eye exam normal without stare, lid lag or exophthalmos.  EOM intact.   Lungs: Clear with good BS bilat with no rales, rhonchi, or wheezes  Heart: RRR with normal S1 and S2 and no gallops; no murmurs; no rub  Abdomen: soft, nontender, no lipodystrophy  Extremities: trace pretibial edema. No tremor.   Skin: Normal texture and temperature to palpation. No rash noted.   Neuro: MS is good with appropriate affect, pt is alert and Ox3     DATA REVIEWED:  Lab Results  Component Value Date   HGBA1C 9.3 (A) 08/23/2018   HGBA1C 8.4 (A) 04/26/2018   HGBA1C 9.1 01/18/2018      ASSESSMENT / PLAN / RECOMMENDATIONS:   1) Type 2 Diabetes Mellitus, poorly controlled - Most recent A1c of 9.3% %. Goal A1c < 8.0 %.    Plan: - Her poorly controlled diabetes is due to constant dietary indiscretions. Unfortunately there's more to her management then just treating diabetes.  She lives a lone, daughter believes mother may get confused with meds, not sure what she is taking and when she is taking it, she may  also be more forgetful, daughter states mother eats when ever she wants and what ever she wants. Daughter checks on her but daughter also has to manage rental properties . Daughter is debating whether mother should move with her or if she could use home health help.  - I have advised her to check with PCP , to see if insurance will cover for home health agency to send a nurse for medication management.  - I tried to to advised the patient to eat 3 consistent  meals a day and avoid as much snacking as we can, daughter will start helping with shopping to make better choices for snack options.  - Despite the advised to decrease Novolog mix to 12 units, patient did not realize this and has continued on 14 units BID, plus tresiba, prandin and actos. Her BG's initially in October were 99-120's mg/dL but over the rest of the month she has gradually trended to 300's. - I am suspecting that she doesn;t take her medications as prescribed and combined with constant eating is an issue.  - I will not increase actos due to trace edema already and in the past had worsening LE edema on larger dose per daughter.  - Since she is not always taking prandin and with her erratic eating habits and forgetfulness, we will stop prandin to try and simplify her regime, will also switch Novolog MIX to Novolog .  - This is a tough situation in an 84 yr old with memory issues and living alone, the goal will be to avoid hypoglycemia and symptomatic hyperglycemia.   MEDICATIONS:  STOP Prandin  STOP Novolog MIX  Continue Tresiba 6 units daily   Start Novolog 6 units with each meal   I have advised the daughter to remove Novolog MIX and prandin from mother's reach.   EDUCATION / INSTRUCTIONS:  BG monitoring instructions: Patient is instructed to check her blood sugars 4 times a day, before meals and bedtime.  Call [facility]  Endocrinology clinic if: BG persistently < 70 or > 300. . I reviewed the Rule of 15 for the treatment of  hypoglycemia in detail with the patient. Literature supplied.   Time spent with patient was 30 minutes , > 50% of the time was spent in counseling and education.   F/U in 1 week    Signed electronically by: Mack Guise, MD  [facility]  [facility] [address] [place], [place] [address] Phone: [phone] FAX: [phone]   CC: Ernestene Kiel, MD [address] [place] [address] Phone: [phone]  Fax: [phone]  Return to Endocrinology clinic as below: Future Appointments  Date Time Provider [place]  10/11/2018 10:30 AM Lavaeh Bau, Melanie Crazier, MD [facility] None

## 2018-10-04 NOTE — Patient Instructions (Addendum)
-   STOP Novolog MIX  - STOP Prandin  - Continue Tresiba at 6 units daily - Start Novolog at 6 units  - Check sugar before each meal and at BEDTIME    Choose healthy, lower carb lower calorie snacks: toss salad, cooked vegetables, cottage cheese, peanut butter, low fat cheese / string cheese, lower sodium deli meat, tuna salad or chicken salad

## 2018-10-09 ENCOUNTER — Ambulatory Visit: Payer: Medicare Other | Admitting: Endocrinology

## 2018-10-11 ENCOUNTER — Ambulatory Visit (INDEPENDENT_AMBULATORY_CARE_PROVIDER_SITE_OTHER): Payer: Medicare Other | Admitting: Internal Medicine

## 2018-10-11 ENCOUNTER — Encounter: Payer: Self-pay | Admitting: Internal Medicine

## 2018-10-11 VITALS — BP 138/76 | HR 61 | Resp 16 | Ht 64.0 in | Wt 179.0 lb

## 2018-10-11 DIAGNOSIS — E1165 Type 2 diabetes mellitus with hyperglycemia: Secondary | ICD-10-CM

## 2018-10-11 DIAGNOSIS — Z794 Long term (current) use of insulin: Secondary | ICD-10-CM

## 2018-10-11 NOTE — Patient Instructions (Addendum)
-   Increase Tresiba to 8 units daily - Increase Novolog to 7 units  - Check sugar before each meal and at BEDTIME    Choose healthy, lower carb lower calorie snacks: toss salad, cooked vegetables, cottage cheese, peanut butter, low fat cheese / string cheese, lower sodium deli meat, tuna salad or chicken salad

## 2018-10-11 NOTE — Progress Notes (Signed)
Name: Vanessa Craig  Age/ Sex: 84 y.o., female   MRN/ DOB: [id number], 12/02/1927     PCP: Ernestene Kiel, MD   Reason for Endocrinology Evaluation: Type 2 Diabetes Mellitus  Initial Endocrine Consultative Visit: 2014    PATIENT IDENTIFIER: [name] is a 84 y.o. female with a past medical history of . The patient has followed with Endocrinology clinic since 2014 for consultative assistance with management of her diabetes.  DIABETIC HISTORY:  Vanessa Craig was diagnosed with T2DM in 1995, She has tried Metformin in the past, but this was discontinued due to renal function. She has been on Victoza since 2011. She was started on premix insulin in 2017 due to persistent hyperglycemia . Her hemoglobin A1c has ranged from 6.9% in 2015, peaking at 9.3% in 2019. Actos was restarted on September, 2019 at 15 mg (she is unable to tolerate higher doses due to edema) In October, 2019 her BG's were consistently > 250 mg/dL despite being on Tresiba, prandin , Premix insulin and actos. Which is more consistent with medication non-adherence.  To simplify regimen , we stopped prandin and premix insulin. We continues Actos, tresiba and started [employer].   SUBJECTIVE:   During the last visit (10/04/18):Marland Kitchen BG's were consistently > 250 mg/dL despite being on Tresiba, prandin , Premix insulin and actos, which is more consistent with medication non-adherence. Pt was also have symptoms of hypoglycemia at times with this regimen.  To simplify regimen , we stopped prandin and premix insulin. We continues Actos, tresiba and started Novolog  Today (10/11/2018): Vanessa Craig is here for a 1 week follow up on her diabetes. She is accompanied by her daughter Vanessa Craig.  She checks her blood sugars 3 times daily. The patient has not had hypoglycemic episodes since the last clinic visit. Otherwise, the patient has not required any  recent emergency interventions for hypoglycemia and has not had recent hospitalizations secondary to hyper or hypoglycemic episodes.     ROS: As per HPI and as detailed below: Review of Systems  Constitutional: Negative for fever and weight loss.  HENT: Negative for congestion and sore throat.   Respiratory: Negative for cough and shortness of breath.   Cardiovascular: Negative for chest pain and palpitations.      HOME DIABETES REGIMEN:  Tresiba 6 units supper time Novolog 6 units TID QAC Actos 15 mg daily     METER DOWNLOAD SUMMARY: Date range evaluated: 10/17-11/15/2019 Fingerstick Blood Glucose Tests = 34 Overall Mean FS Glucose = 290 Standard Deviation = 76  BG Ranges: Low = 178 High = 469   Hypoglycemic Events/30 Days: BG < 50 = 0 Episodes of symptomatic severe hypoglycemia = 0    HISTORY:  Past Medical History:  Past Medical History:  Diagnosis Date  . Chronic kidney disease, stage III (moderate) ([place]) 08/06/2013  . Dementia ([place])   . Diabetes mellitus without complication ([place])   . DM (diabetes mellitus) ([place]) 07/31/2013  . Esophageal reflux 03/08/2018  . Essential hypertension 08/24/2016  . Hypertension   . Obesity 03/08/2018  . Other and unspecified hyperlipidemia 08/06/2013  . PUD (peptic ulcer disease) 03/08/2018  . Sinus bradycardia 03/08/2018  . Unspecified essential hypertension 08/04/2013   Past Surgical History:  Past Surgical History:  Procedure Laterality Date  . APPENDECTOMY      Social History:  reports that she has never smoked. She has never used smokeless tobacco. She reports that she does not drink alcohol or use drugs. Family History:  Family History  Problem Relation Age of Onset  . Hypertension Mother   . Heart disease Father   . Heart attack Father      HOME MEDICATIONS: Allergies as of 10/11/2018      Reactions   Codeine Nausea And Vomiting      Medication List        Accurate as of 10/11/18  2:28 PM. Always use your  most recent med list.          amLODipine 10 MG tablet Commonly known as:  NORVASC Take 1 tablet (10 mg total) by mouth daily.   aspirin 81 MG tablet Take 81 mg by mouth at bedtime.   B-D ULTRAFINE III SHORT PEN 31G X 8 MM Misc Generic drug:  Insulin Pen Needle Use 2 daily   carvedilol 6.25 MG tablet Commonly known as:  COREG Take 0.5 tablets (3.125 mg total) by mouth 2 (two) times daily with a meal. Do not take the second if HR <55 BPM   Fish Oil 1000 MG Caps Take 1,000 mg by mouth daily.   furosemide 20 MG tablet Commonly known as:  LASIX Take 20 mg by mouth. TAKE 1 TABLET BY MOUTH DAILY FOR ANKLE SWELLING.   glucose blood test strip Use to check blood sugar twice daily.   glucose blood test strip Use to test blood sugars twice daily   guanFACINE 2 MG tablet Commonly known as:  TENEX Take 2 mg by mouth at bedtime.   insulin aspart 100 UNIT/ML FlexPen Commonly known as:  NOVOLOG Inject 6 Units into the skin 3 (three) times daily with meals.   insulin degludec 100 UNIT/ML Sopn FlexTouch Pen Commonly known as:  TRESIBA Inject 0.1 mLs (10 Units total) into the skin daily.   lansoprazole 15 MG capsule Commonly known as:  PREVACID TAKE 1 CAPSULE BY MOUTH FOR STOMACH ACID ONCE A DAY IN THE MORNINGS   meclizine 12.5 MG tablet Commonly known as:  ANTIVERT Take 12.5 mg by mouth daily as needed for dizziness.   multivitamin with minerals Tabs tablet Take 1 tablet by mouth daily.   pioglitazone 15 MG tablet Commonly known as:  ACTOS Take 1 tablet (15 mg total) by mouth daily.   raloxifene 60 MG tablet Commonly known as:  EVISTA TAKE 1 TABLET BY MOUTH EVERY DAY   ramipril 10 MG capsule Commonly known as:  ALTACE Take 1 capsule (10 mg total) by mouth 2 (two) times daily. Do not take second-PM dose if systolic < 017   ranitidine 300 MG tablet Commonly known as:  ZANTAC TAKE 1 TABLET BY MOUTH EVERYDAY AT BEDTIME   rivastigmine 4.6 mg/24hr Commonly known as:   EXELON Place 4.6 mg onto the skin daily.   rosuvastatin 10 MG tablet Commonly known as:  CRESTOR TAKE 1 TABLET BY MOUTH EVERY DAY   traZODone 50 MG tablet Commonly known as:  DESYREL Take 50 mg by mouth at bedtime as needed for sleep.   Vitamin D3 75 MCG (3000 UT) Tabs Take 1 capsule by mouth at bedtime.        OBJECTIVE:   Vital Signs: BP 138/76   Pulse 61   Resp 16   Ht 5\' 4"  (1.626 m)   Wt 179 lb (81.2 kg)   SpO2 97%   BMI 30.73 kg/m   Wt Readings from Last 3 Encounters:  10/11/18 179 lb (81.2 kg)  10/04/18 180 lb (81.6 kg)  08/30/18 181 lb (82.1 kg)     Exam: General: Pt appears  well and is in NAD  Lungs: Clear with good BS bilat with no rales, rhonchi, or wheezes  Heart: RRR with normal S1 and S2 and no gallops; no murmurs; no rub  Abdomen: soft, nontender, no lipodystrophy  Extremities: trace pretibial edema. No tremor.   Neuro: MS is good with appropriate affect, pt is alert and Ox3     DATA REVIEWED:  Lab Results  Component Value Date   HGBA1C 9.3 (A) 08/23/2018   HGBA1C 8.4 (A) 04/26/2018   HGBA1C 9.1 01/18/2018      ASSESSMENT / PLAN / RECOMMENDATIONS:   1) Type 2 Diabetes Mellitus, poorly controlled - Most recent A1c of 9.3% %. Goal A1c < 8.0 %.    Plan: - In review of her glucose meter, patient continues with hyperglycemia, which is not unusual since we stopped her prandin and 28 units a day of Novolog Mix, which was replaced by 18 units of Novolog a day, the difference in glucose readings have not been that much , which indicates that the patient was not always taking her medications as prescribed.  - Daughter is still trying to figure out mother's care, as she works and can;t be home 24/7 with mother, she spoke to her PCP and options provided of assistant living vs having a private care giver.  - At one point her glucose was 469 mg/dL (post-prandial) daughter is not sure mother took Novolog prior to that meal.  - Daughter is wonderingif  there's an insulin injection that can be given once a day but I explained to daughter that with current hyperglycemia, we are unable to just use one dose a day.   MEDICATIONS:  Increase Tresiba to 8 units daily   Increase Novolog to 7 units with each meal   Continue Actos 15 mg daily    EDUCATION / INSTRUCTIONS:  BG monitoring instructions: Patient is instructed to check her blood sugars 4 times a day, before meals and bedtime.  Call [facility] clinic if: BG persistently < 70 or > 300. . I reviewed the Rule of 15 for the treatment of hypoglycemia in detail with the patient. Literature supplied.     F/U in 2 weeks    Signed electronically by: Mack Guise, MD  [facility]  [facility] [address] [place], [place] [address] Phone: [phone] FAX: [phone]   CC: Ernestene Kiel, MD [address] [place] [address] Phone: [phone]  Fax: [phone]  Return to Endocrinology clinic as below: Future Appointments  Date Time Provider [place]  10/29/2018 10:30 AM Orel Hord, Melanie Crazier, MD [facility] None

## 2018-10-15 ENCOUNTER — Other Ambulatory Visit: Payer: Self-pay

## 2018-10-16 ENCOUNTER — Other Ambulatory Visit: Payer: Self-pay | Admitting: Internal Medicine

## 2018-10-16 ENCOUNTER — Telehealth: Payer: Self-pay | Admitting: Emergency Medicine

## 2018-10-16 MED ORDER — INSULIN DEGLUDEC 100 UNIT/ML ~~LOC~~ SOPN: 12.0000 [IU] | PEN_INJECTOR | Freq: Every day | SUBCUTANEOUS | 1 refills | Status: DC

## 2018-10-16 NOTE — Telephone Encounter (Signed)
Pt daughter called to give pt's sugar readings:  11/15 6:56pm 276 10:09 215  11/16 8:29am 310 12:22pm 297 6:25pm 345 10:39pm 201  11/17 8:18am 257 6:53pm 271  11/18  8:41am 278 12:29pm 258 6:50pm 294  11/19  8:52am 277 12:53pm 190 5:59 228  11/20 9:10am 335

## 2018-10-16 NOTE — Telephone Encounter (Signed)
LVM informing Pts daughter.   Also contacted pt to inform of changes. Pt wrote down changes while on phone.

## 2018-10-16 NOTE — Telephone Encounter (Signed)
Please increase tresiba from 8 units to 12 units Increase Novolog to 8 units with each meal   She has been doing good with checking sugars but it seems that last one of the day, is at supper not Bedtime.  I would like to her to check a bedtime as well when possible.  Please remind her that the current changes will take another 4 days prior to changes.  And she can call us next week with readings again .    Thanks a lot

## 2018-10-21 ENCOUNTER — Telehealth: Payer: Self-pay

## 2018-10-21 ENCOUNTER — Telehealth: Payer: Self-pay | Admitting: Internal Medicine

## 2018-10-21 NOTE — Telephone Encounter (Signed)
Blood sugar readings for last 4 days  10/17/18  304 a.m. 193 p.m  10/17/18 265 p.m. 10/18/18 259 a.m. 300 p.m. 10/19/18 185 p.m.263 p.m. 10/20/18 271 p.m. 10/21/18 220 am   each has been before a meal or at bedtime

## 2018-10-21 NOTE — Telephone Encounter (Signed)
Please call daughter Santiago Glad at ph# [phone] patient's blood sugar readings

## 2018-10-21 NOTE — Telephone Encounter (Signed)
lft vm for pt daughter Santiago Glad with instructions

## 2018-10-23 NOTE — Progress Notes (Signed)
Name: Vanessa Craig  Age/ Sex: 84 y.o., female   MRN/ DOB: [id number], [birth year]     PCP: Ernestene Kiel, MD   Reason for Endocrinology Evaluation: Type 2 Diabetes Mellitus  Initial Endocrine Consultative Visit: 2014    PATIENT IDENTIFIER: [name] is a 84 y.o. female with a past medical history of . The patient has followed with Endocrinology clinic since 2014 for consultative assistance with management of her diabetes.  DIABETIC HISTORY:  [name] was diagnosed with T2DM in 1995, She has tried Metformin in the past, but this was discontinued due to renal function. She has been on Victoza since 2011. She was started on premix insulin in 2017 due to persistent hyperglycemia . Her hemoglobin A1c has ranged from 6.9% in 2015, peaking at 9.3% in 2019. Actos was restarted on September, 2019 at 15 mg (she is unable to tolerate higher doses due to edema) In October, 2019 her BG's were consistently > 250 mg/dL despite being on Tresiba, prandin , Premix insulin and actos. Which is more consistent with medication non-adherence.  To simplify regimen , we stopped prandin and premix insulin. We continues Actos, tresiba and started Novolog in October, 2019  SUBJECTIVE:   During the last visit (10/11/18):Marland Kitchen BG's remained consistently > 250 mg/dL.  Which was no difference between when she was on Tresiba, prandin , Premix insulin and actos, and when prandin was stopped and the premix insulin was switched to Novolog. Which is more consistent with medication non-adherence and forgetfulness.   We have increased tresiba and Novolog dosing by 20%.    Today (10/29/2018): [name] is here for a 3 week follow up on her diabetes. Since her last visit we have increased her basal/bolus insulin as below . She is accompanied by her daughter Vanessa Craig.  She checks her blood sugars 3 times daily. The patient has not  had hypoglycemic episodes since the last clinic visit. Otherwise, the patient has not required any recent emergency interventions for hypoglycemia and has not had recent hospitalizations secondary to hyper or hypoglycemic episodes.   Daughter noted on one occasion mother shaking, she gave her something to eat and took Novolog with it. There's no glucose readings at the time of the episode.    ROS: As per HPI and as detailed below: Review of Systems  Constitutional: Negative for fever and weight loss.  HENT: Negative for congestion and sore throat.   Respiratory: Negative for cough and shortness of breath.   Cardiovascular: Negative for chest pain and palpitations.      HOME DIABETES REGIMEN:  Tresiba 12 units supper time Novolog 10 units TID QAC Actos 15 mg daily     METER DOWNLOAD SUMMARY: Date range evaluated: 11/4-12/3/19 Fingerstick Blood Glucose Tests =  Overall Mean FS Glucose = 290 Standard Deviation = 76  BG Ranges: Low = 178 High = 469   Hypoglycemic Events/30 Days: BG < 50 = 0 Episodes of symptomatic severe hypoglycemia = 0    HISTORY:  Past Medical History:  Past Medical History:  Diagnosis Date  . Chronic kidney disease, stage III (moderate) ([place]) 08/06/2013  . Dementia ([place])   . Diabetes mellitus without complication ([place])   . DM (diabetes mellitus) ([place]) 07/31/2013  . Esophageal reflux 03/08/2018  . Essential hypertension 08/24/2016  . Hypertension   . Obesity 03/08/2018  . Other and unspecified hyperlipidemia 08/06/2013  . PUD (peptic ulcer disease) 03/08/2018  . Sinus bradycardia 03/08/2018  . Unspecified essential hypertension 08/04/2013   Past  Surgical History:  Past Surgical History:  Procedure Laterality Date  . APPENDECTOMY      Social History:  reports that she has never smoked. She has never used smokeless tobacco. She reports that she does not drink alcohol or use drugs. Family History:  Family History  Problem Relation Age of Onset  .  Hypertension Mother   . Heart disease Father   . Heart attack Father      HOME MEDICATIONS: Allergies as of 10/29/2018      Reactions   Codeine Nausea And Vomiting      Medication List        Accurate as of 10/23/18  8:06 AM. Always use your most recent med list.          amLODipine 10 MG tablet Commonly known as:  NORVASC Take 1 tablet (10 mg total) by mouth daily.   aspirin 81 MG tablet Take 81 mg by mouth at bedtime.   B-D ULTRAFINE III SHORT PEN 31G X 8 MM Misc Generic drug:  Insulin Pen Needle Use 2 daily   carvedilol 6.25 MG tablet Commonly known as:  COREG Take 0.5 tablets (3.125 mg total) by mouth 2 (two) times daily with a meal. Do not take the second if HR <55 BPM   Fish Oil 1000 MG Caps Take 1,000 mg by mouth daily.   furosemide 20 MG tablet Commonly known as:  LASIX Take 20 mg by mouth. TAKE 1 TABLET BY MOUTH DAILY FOR ANKLE SWELLING.   glucose blood test strip Use to check blood sugar twice daily.   glucose blood test strip Use to test blood sugars twice daily   guanFACINE 2 MG tablet Commonly known as:  TENEX Take 2 mg by mouth at bedtime.   insulin aspart 100 UNIT/ML FlexPen Commonly known as:  NOVOLOG Inject 6 Units into the skin 3 (three) times daily with meals.   insulin degludec 100 UNIT/ML Sopn FlexTouch Pen Commonly known as:  TRESIBA Inject 0.12 mLs (12 Units total) into the skin daily.   lansoprazole 15 MG capsule Commonly known as:  PREVACID TAKE 1 CAPSULE BY MOUTH FOR STOMACH ACID ONCE A DAY IN THE MORNINGS   meclizine 12.5 MG tablet Commonly known as:  ANTIVERT Take 12.5 mg by mouth daily as needed for dizziness.   multivitamin with minerals Tabs tablet Take 1 tablet by mouth daily.   pioglitazone 15 MG tablet Commonly known as:  ACTOS Take 1 tablet (15 mg total) by mouth daily.   raloxifene 60 MG tablet Commonly known as:  EVISTA TAKE 1 TABLET BY MOUTH EVERY DAY   ramipril 10 MG capsule Commonly known as:   ALTACE Take 1 capsule (10 mg total) by mouth 2 (two) times daily. Do not take second-PM dose if systolic < 494   ranitidine 300 MG tablet Commonly known as:  ZANTAC TAKE 1 TABLET BY MOUTH EVERYDAY AT BEDTIME   rivastigmine 4.6 mg/24hr Commonly known as:  EXELON Place 4.6 mg onto the skin daily.   rosuvastatin 10 MG tablet Commonly known as:  CRESTOR TAKE 1 TABLET BY MOUTH EVERY DAY   traZODone 50 MG tablet Commonly known as:  DESYREL Take 50 mg by mouth at bedtime as needed for sleep.   Vitamin D3 75 MCG (3000 UT) Tabs Take 1 capsule by mouth at bedtime.        OBJECTIVE:   Vital Signs: There were no vitals taken for this visit.  Wt Readings from Last 3 Encounters:  10/11/18  179 lb (81.2 kg)  10/04/18 180 lb (81.6 kg)  08/30/18 181 lb (82.1 kg)     Exam: General: Pt appears well and is in NAD  Lungs: Clear with good BS bilat with no rales, rhonchi, or wheezes  Heart: RRR with normal S1 and S2 and no gallops; no murmurs; no rub  Abdomen: soft, nontender, no lipodystrophy  Extremities: trace pretibial edema. No tremor.   Neuro: MS is good with appropriate affect, pt is alert and Ox3     DATA REVIEWED:  Lab Results  Component Value Date   HGBA1C 9.3 (A) 08/23/2018   HGBA1C 8.4 (A) 04/26/2018   HGBA1C 9.1 01/18/2018      ASSESSMENT / PLAN / RECOMMENDATIONS:   1) Type 2 Diabetes Mellitus, poorly controlled - Most recent A1c of 9.3% %. Goal A1c < 8.0 %.    Plan: - In review of her glucose meter, she continues with glucose variability between low 100's  To 300's. There's clear pattern of eating with no prandial coverage. But in review of her other data where she clearly took her insulin as prescribed her BG's were running between 99 - to less then 200 mg/dL.  - The goal in this 84 year old patient is to avoid hypoglycemia and avoid symptomatic hyperglycemia.  - Encouraged family to provide constant reminders to matching insulin with carbohydrate intake.  -  Discussed option for low carb snacking.   MEDICATIONS:  Tyler Aas 12 units daily   Novolog 10 units with each meal   Continue Actos 15 mg daily    EDUCATION / INSTRUCTIONS:  BG monitoring instructions: Patient is instructed to check her blood sugars 4 times a day, before meals and bedtime.  Call [facility] if: BG persistently < 70 or > 300. . I reviewed the Rule of 15 for the treatment of hypoglycemia in detail with the patient. Literature supplied.     F/U in 6 weeks    Signed electronically by: Mack Guise, MD  [facility]  [address] [place], [place] [address] Phone: [phone] FAX: [phone]   CC: Ernestene Kiel, MD [address] [place] [address] Phone: [phone]  Fax: [phone]  Return to Endocrinology clinic as below: Future Appointments  Date Time Provider [place]  10/29/2018 10:30 AM Jo-Anne Kluth, Melanie Crazier, MD [facility] None

## 2018-10-29 ENCOUNTER — Encounter: Payer: Self-pay | Admitting: Internal Medicine

## 2018-10-29 ENCOUNTER — Ambulatory Visit (INDEPENDENT_AMBULATORY_CARE_PROVIDER_SITE_OTHER): Payer: Medicare Other | Admitting: Internal Medicine

## 2018-10-29 VITALS — BP 144/88 | HR 58 | Ht 64.0 in | Wt 184.0 lb

## 2018-10-29 DIAGNOSIS — Z794 Long term (current) use of insulin: Secondary | ICD-10-CM

## 2018-10-29 DIAGNOSIS — E1165 Type 2 diabetes mellitus with hyperglycemia: Secondary | ICD-10-CM

## 2018-10-29 MED ORDER — GLUCOSE BLOOD VI STRP: 1.0000 | ORAL_STRIP | Freq: Four times a day (QID) | 11 refills | Status: DC

## 2018-10-29 NOTE — Patient Instructions (Signed)
-   Continue Tresiba at 12 units daily - Continue Novolog at 10 units with each meal  - HOW TO TREAT LOW BLOOD SUGARS (Blood sugar LESS THAN 70 MG/DL)  Please follow the RULE OF 15 for the treatment of hypoglycemia treatment (when your (blood sugars are less than 70 mg/dL)    STEP 1: Take 15 grams of carbohydrates when your blood sugar is low, which includes:   3-4 GLUCOSE TABS  OR  3-4 OZ OF JUICE OR REGULAR SODA OR  ONE TUBE OF GLUCOSE GEL     STEP 2: RECHECK blood sugar in 15 MINUTES STEP 3: If your blood sugar is still low at the 15 minute recheck --> then, go back to STEP 1 and treat AGAIN with another 15 grams of carbohydrates.

## 2018-11-15 ENCOUNTER — Other Ambulatory Visit: Payer: Self-pay

## 2018-11-15 MED ORDER — GLUCOSE BLOOD VI STRP: ORAL_STRIP | 5 refills | Status: DC

## 2018-11-22 ENCOUNTER — Other Ambulatory Visit: Payer: Self-pay

## 2018-11-22 ENCOUNTER — Telehealth: Payer: Self-pay | Admitting: Endocrinology

## 2018-11-22 MED ORDER — INSULIN ASPART 100 UNIT/ML FLEXPEN: PEN_INJECTOR | SUBCUTANEOUS | 11 refills | Status: DC

## 2018-11-22 NOTE — Telephone Encounter (Signed)
Rx sent 

## 2018-11-22 NOTE — Telephone Encounter (Signed)
MEDICATION: novolog  PHARMACY:  [facility] [address]   IS THIS A 90 DAY SUPPLY : no  IS PATIENT OUT OF MEDICATION: no  IF NOT; HOW MUCH IS LEFT: 3/4 empty on last pen  LAST APPOINTMENT DATE: @12 /20/2019  NEXT APPOINTMENT DATE:@1 /14/2020  DO WE HAVE YOUR PERMISSION TO LEAVE A DETAILED MESSAGE:  OTHER COMMENTS: there are refills at the pharmacy but the insurance will not pay until 1//15/20 due to the recent change in dosing   **Let patient know to contact pharmacy at the end of the day to make sure medication is ready. **  ** Please notify patient to allow 48-72 hours to process**  **Encourage patient to contact the pharmacy for refills or they can request refills through [facility]**

## 2018-12-10 ENCOUNTER — Ambulatory Visit: Payer: Medicare Other | Admitting: Endocrinology

## 2018-12-17 ENCOUNTER — Encounter: Payer: Self-pay | Admitting: Endocrinology

## 2018-12-17 ENCOUNTER — Other Ambulatory Visit: Payer: Self-pay

## 2018-12-17 ENCOUNTER — Ambulatory Visit (INDEPENDENT_AMBULATORY_CARE_PROVIDER_SITE_OTHER): Payer: Medicare Other | Admitting: Endocrinology

## 2018-12-17 VITALS — BP 120/70 | HR 64 | Ht 64.0 in | Wt 181.0 lb

## 2018-12-17 DIAGNOSIS — I10 Essential (primary) hypertension: Secondary | ICD-10-CM | POA: Diagnosis not present

## 2018-12-17 DIAGNOSIS — Z794 Long term (current) use of insulin: Secondary | ICD-10-CM | POA: Diagnosis not present

## 2018-12-17 DIAGNOSIS — E1165 Type 2 diabetes mellitus with hyperglycemia: Secondary | ICD-10-CM | POA: Diagnosis not present

## 2018-12-17 LAB — POCT GLYCOSYLATED HEMOGLOBIN (HGB A1C): HEMOGLOBIN A1C: 8.7 % — AB (ref 4.0–5.6)

## 2018-12-17 MED ORDER — GLUCOSE BLOOD VI STRP: ORAL_STRIP | 5 refills | Status: DC

## 2018-12-17 MED ORDER — PIOGLITAZONE HCL 15 MG PO TABS: 15.0000 mg | ORAL_TABLET | Freq: Every day | ORAL | 1 refills | Status: DC

## 2018-12-17 NOTE — Progress Notes (Signed)
Patient ID: Vanessa Craig, female   DOB: 27-Aug-1928, 85 y.o.   MRN: [id number]   Reason for Appointment: Diabetes follow-up   History of Present Illness   Diagnosis: Type 2 DIABETES MELITUS, date of diagnosis: 1995        She has had mild and usually well-controlled diabetes for several years Previously on metformin and this was stopped because of renal dysfunction Also has been tried on low dose basal insulin previously However with using GLP-1 drugs like [place] and Victoza her blood sugars have been fairly well controlled She has been on Victoza since 2011   Because of significantly high readings including over 300 in October 2017 she was started on low-dose premixed insulin at suppertime  Recent history:  Non-insulin hypoglycemic drugs:.  Actos 15 mg daily  INSULIN regimen: NovoLog 10 units before meals, TRESIBA 12 units daily  Her A1c is relatively better at 8.7    Current management, blood sugar patterns and problems identified:  She was seen by Dr. Maretta Bees last month and was changed from 70/30 to NovoLog before meals  However with this regimen her blood sugars are at least once dropping low at lunchtime or being relatively low  Also her daughter is finding it inconvenient to supervise her insulin doses at lunchtime because of her additional injection  Also she is needing to check her sugars more often and is currently doing at least 4 times a day on most days  Currently blood sugars are markedly increased fasting and before dinnertime with averages over 200  However blood sugars after evening meal are quite variable and somewhat better  Her Prandin was stopped  Actos was resumed although she is out of it now  She is still not very active   Hypoglycemia: None  Side effects from medications: Nausea from [place]:  Currently 4 times a day.    Glucometer: One Touch Verio       Blood Glucose readings    PRE-MEAL Fasting  Lunch Dinner Bedtime Overall  Glucose range:   47-289     Mean/median:  215  111  243  217 186   POST-MEAL PC Breakfast PC Lunch PC Dinner  Glucose range:     Mean/median:    191   Previous readings:  PRE-MEAL Fasting Lunch Dinner Bedtime Overall  Glucose range:  167-306  177-337  150-271    Mean/median:   221    231+/-2 5   POST-MEAL PC Breakfast PC Lunch PC Dinner  Glucose range:    138-357  Mean/median:       Meals: 3 meals per day. Dinner 6-7pm  Special K Cereal or raisin bran, rarely eggs, variable carbohydrate intake at other meals         Physical activity: exercise:  some walking when the weather is good  Dietician visit: Most recent: Years ago          Wt Readings from Last 3 Encounters:  12/17/18 181 lb (82.1 kg)  10/29/18 184 lb (83.5 kg)  10/11/18 179 lb (81.2 kg)    Lab Results  Component Value Date   HGBA1C 8.7 (A) 12/17/2018   HGBA1C 9.3 (A) 08/23/2018   HGBA1C 8.4 (A) 04/26/2018   Lab Results  Component Value Date   MICROALBUR 8.4 (H) 01/18/2018   LDLCALC 125 (H) 04/26/2018   CREATININE 1.58 (H) 08/23/2018     OTHER active problems: See review of systems   Office Visit on 12/17/2018  Component  Date Value Ref Range Status  . Hemoglobin A1C 12/17/2018 8.7* 4.0 - 5.6 % Final    Allergies as of 12/17/2018      Reactions   Codeine Nausea And Vomiting      Medication List       Accurate as of December 17, 2018  3:23 PM. Always use your most recent med list.        amLODipine 10 MG tablet Commonly known as:  NORVASC Take 1 tablet (10 mg total) by mouth daily.   aspirin 81 MG tablet Take 81 mg by mouth at bedtime.   B-D ULTRAFINE III SHORT PEN 31G X 8 MM Misc Generic drug:  Insulin Pen Needle Use 2 daily   carvedilol 6.25 MG tablet Commonly known as:  COREG Take 0.5 tablets (3.125 mg total) by mouth 2 (two) times daily with a meal. Do not take the second if HR <55 BPM   Fish Oil 1000 MG Caps Take 1,000 mg by mouth daily.     furosemide 20 MG tablet Commonly known as:  LASIX Take 20 mg by mouth. TAKE 1 TABLET BY MOUTH DAILY FOR ANKLE SWELLING.   glucose blood test strip Commonly known as:  ACCU-CHEK AVIVA PLUS 1 each by Other route 4 (four) times daily. Use to check blood sugar twice daily.   glucose blood test strip Commonly known as:  ONETOUCH VERIO Use to test blood sugars four times daily. DX:E11.65   guanFACINE 2 MG tablet Commonly known as:  TENEX Take 2 mg by mouth at bedtime.   insulin aspart 100 UNIT/ML FlexPen Commonly known as:  NOVOLOG Inject 8 units three times daily with meals   insulin degludec 100 UNIT/ML Sopn FlexTouch Pen Commonly known as:  TRESIBA FLEXTOUCH Inject 0.12 mLs (12 Units total) into the skin daily.   lansoprazole 15 MG capsule Commonly known as:  PREVACID TAKE 1 CAPSULE BY MOUTH FOR STOMACH ACID ONCE A DAY IN THE MORNINGS   meclizine 12.5 MG tablet Commonly known as:  ANTIVERT Take 12.5 mg by mouth daily as needed for dizziness.   multivitamin with minerals Tabs tablet Take 1 tablet by mouth daily.   pioglitazone 15 MG tablet Commonly known as:  ACTOS Take 1 tablet (15 mg total) by mouth daily.   raloxifene 60 MG tablet Commonly known as:  EVISTA TAKE 1 TABLET BY MOUTH EVERY DAY   ramipril 10 MG capsule Commonly known as:  ALTACE Take 1 capsule (10 mg total) by mouth 2 (two) times daily. Do not take second-PM dose if systolic < 664   ranitidine 300 MG tablet Commonly known as:  ZANTAC TAKE 1 TABLET BY MOUTH EVERYDAY AT BEDTIME   rosuvastatin 10 MG tablet Commonly known as:  CRESTOR TAKE 1 TABLET BY MOUTH EVERY DAY   traZODone 50 MG tablet Commonly known as:  DESYREL Take 50 mg by mouth at bedtime as needed for sleep.   Vitamin D3 75 MCG (3000 UT) Tabs Take 1 capsule by mouth at bedtime.       Allergies:  Allergies  Allergen Reactions  . Codeine Nausea And Vomiting    Past Medical History:  Diagnosis Date  . Chronic kidney disease,  stage III (moderate) ([place]) 08/06/2013  . Dementia ([place])   . Diabetes mellitus without complication ([place])   . DM (diabetes mellitus) ([place]) 07/31/2013  . Esophageal reflux 03/08/2018  . Essential hypertension 08/24/2016  . Hypertension   . Obesity 03/08/2018  . Other and unspecified hyperlipidemia 08/06/2013  .  PUD (peptic ulcer disease) 03/08/2018  . Sinus bradycardia 03/08/2018  . Unspecified essential hypertension 08/04/2013    Past Surgical History:  Procedure Laterality Date  . APPENDECTOMY      Family History  Problem Relation Age of Onset  . Hypertension Mother   . Heart disease Father   . Heart attack Father     Social History:  reports that she has never smoked. She has never used smokeless tobacco. She reports that she does not drink alcohol or use drugs.  Review of Systems:  THYROID NODULE:  This was incidentally discovered ULTRASOUND of thyroid was done by her PCP and showed a 3.4 cm mostly solid isoechoic nodule, this was done on 05/29/2018     Osteopenia: She is on Evista from her PCP   HYPERTENSION:  she has had long-standing hypertension treated with multiple drugs including 10 mg ramipril Followed by PCP  BP Readings from Last 3 Encounters:  12/17/18 120/70  10/29/18 (!) 144/88  10/11/18 138/76   EDEMA: She has had mild edema only, currently on 20 mg Lasix  Renal dysfunction: She has had persistent renal dysfunction but this has been  stable for several years   Lab Results  Component Value Date   CREATININE 1.58 (H) 08/23/2018    HYPERLIPIDEMIA: The lipid abnormality consists of elevated LDL and high triglycerides  She is taking Crestor instead of pravastatin 80 mg and fenofibrate 160 mg daily Previously her LDL was well over 100   Lab Results  Component Value Date   CHOL 166 08/23/2018   HDL 36.70 (L) 08/23/2018   LDLCALC 125 (H) 04/26/2018   LDLDIRECT 83.0 08/23/2018   TRIG 379.0 (H) 08/23/2018   CHOLHDL 5 08/23/2018     Examination:   BP  120/70 (BP Location: Left Arm, Patient Position: Sitting, Cuff Size: Normal)   Pulse 64   Ht 5\' 4"  (1.626 m)   Wt 181 lb (82.1 kg)   SpO2 94%   BMI 31.07 kg/m   Body mass index is 31.07 kg/m.   Only trace lower leg edema present   ASSESSMENT/ PLAN:   Diabetes type 2 long-standing  See history of present illness for detailed discussion of  current management, blood sugar patterns and problems identified  Her A1c has been around 8-9  As discussed above her blood sugars are inconsistently controlled with basal bolus insulin regimen She is getting too much NovoLog at breakfast but usually not enough at lunch and dinner and also not getting enough basal insulin with 12 units of Tresiba Because of her dementia her daughter is not able to help her enough and needs a more simpler regimen for her insulin  Recommendations:  She will take NovoLog 70/30 again for convenience and twice a day doses and stop the NovoLog 3 times a day, she can use up the supplies she has at home  She will start with 14 units twice a day  With this she will have less tendency to low sugars at lunchtime and somewhat better profile of blood sugars in the afternoon  She will be able to reduce her Tyler Aas down to 8 units  Discussed with her daughter that she can increase her [place] by 2 units if blood sugars are consistently over 150 next week as a basal insulin and start with 6 units  Discussed in detail the timing of various insulin types, composition of the premixed insulin and action course of both insulin types  Discussed blood sugar targets  She will  also call if she has any low sugars  Continue Actos  May leave off Prandin for now  Follow-up in 6 weeks but will review blood sugars on the phone in 2 weeks  Hypertension: Fairly well controlled  Renal function to be checked by PCP next week    LIPIDS: Needs follow-up from PCP  Counseling time on subjects discussed in assessment and plan  sections is over 50% of today's 25 minute visit   Patient Instructions  Tresiba 8 Units daily in pm   Tresiba insulin: This insulin provides blood sugar control for up to 24 hours.   Start with 8 U  increase by 2 units every 7 days until the waking up sugars are under 160 Then continue the same dose.  If blood sugar is under 100 for 2 days in a row, reduce the dose by 2 units.   Note that this insulin does not control the rise of blood sugar with meals    Novolog Mix 14 at Bfst and supper      Elayne Snare 12/17/2018, 3:23 PM

## 2018-12-17 NOTE — Patient Instructions (Addendum)
Tresiba 8 Units daily in pm   Tresiba insulin: This insulin provides blood sugar control for up to 24 hours.   Start with 8 U  increase by 2 units every 7 days until the waking up sugars are under 160 Then continue the same dose.  If blood sugar is under 100 for 2 days in a row, reduce the dose by 2 units.   Note that this insulin does not control the rise of blood sugar with meals    Novolog Mix 14 at Bfst and supper

## 2018-12-23 DIAGNOSIS — Z79899 Other long term (current) drug therapy: Secondary | ICD-10-CM | POA: Diagnosis not present

## 2018-12-23 DIAGNOSIS — E785 Hyperlipidemia, unspecified: Secondary | ICD-10-CM | POA: Diagnosis not present

## 2018-12-23 DIAGNOSIS — I10 Essential (primary) hypertension: Secondary | ICD-10-CM | POA: Diagnosis not present

## 2018-12-23 DIAGNOSIS — E1165 Type 2 diabetes mellitus with hyperglycemia: Secondary | ICD-10-CM | POA: Diagnosis not present

## 2018-12-23 DIAGNOSIS — G309 Alzheimer's disease, unspecified: Secondary | ICD-10-CM | POA: Diagnosis not present

## 2018-12-23 DIAGNOSIS — Z794 Long term (current) use of insulin: Secondary | ICD-10-CM | POA: Diagnosis not present

## 2018-12-23 DIAGNOSIS — Z6831 Body mass index (BMI) 31.0-31.9, adult: Secondary | ICD-10-CM | POA: Diagnosis not present

## 2018-12-24 DIAGNOSIS — E1142 Type 2 diabetes mellitus with diabetic polyneuropathy: Secondary | ICD-10-CM | POA: Diagnosis not present

## 2018-12-24 DIAGNOSIS — L600 Ingrowing nail: Secondary | ICD-10-CM | POA: Diagnosis not present

## 2018-12-24 DIAGNOSIS — Z794 Long term (current) use of insulin: Secondary | ICD-10-CM | POA: Diagnosis not present

## 2018-12-24 DIAGNOSIS — B351 Tinea unguium: Secondary | ICD-10-CM | POA: Diagnosis not present

## 2019-01-01 ENCOUNTER — Telehealth: Payer: Self-pay | Admitting: Endocrinology

## 2019-01-01 NOTE — Telephone Encounter (Signed)
She can increase her NovoLog Mix 70/30 insulin to 16 units instead of 14.  If she can check some readings before bedtime that would be desirable also

## 2019-01-01 NOTE — Telephone Encounter (Signed)
Called pt's daughter and gave her MD message and medication dose change. Pt's daughter verbalized understanding of this.

## 2019-01-01 NOTE — Telephone Encounter (Signed)
12/18/2018 AM b/4 breakfast Blood Sugar 185  12/18/2018 PM b/4 supper Blood Sugar 233  12/19/2018 AM b/4 breakfast Blood Sugar 160 01/32/2020 PM b/4 supper Blood Sugar 128  12/20/2018 AM b/4 breakfast Blood Sugar 86  12/20/2018 PM b/4 supper Blood Sugar 363  12/21/2018 AM b/4 breakfast Blood Sugar 203  12/21/2018 PM b/4 supper Blood Sugar 206  12/22/2018 AM b/4 breakfast Blood Sugar 195 12/22/2018 PM b/4 supper Blood Sugar 322  12/23/2018 AM b/4 breakfast Blood Sugar 177 12/23/2018 PM b/4 supper Blood Sugar 309  12/24/2018 AM b/4 breakfast Blood Sugar 187  12/24/2018 PM b/4 supper Blood Sugar 306  12/25/2018 AM b/4 breakfast Blood Sugar 177 12/25/2018 PM b/4 supper Blood Sugar 257  12/26/2018 AM b/4 breakfast Blood Sugar 184 12/26/2018 PM b/4 supper Blood Sugar 257  12/27/2018 AM b/4 breakfast Blood Sugar 246 12/27/2018 PM b/4 supper Blood Sugar (not taken)  12/28/2018 AM b/4 breakfast Blood Sugar 219 12/28/2018 PM b/4 supper Blood Sugar 255  12/29/2018 AM b/4 breakfast Blood Sugar 177 12/29/2018 PM b/4 supper Blood Sugar 350  12/30/2018 AM b/4 breakfast Blood Sugar 241 12/30/2018 PM b/4 supper Blood Sugar 256  12/31/2018 AM b/4 breakfast Blood Sugar 285 12/31/2018 PM b/4 supper Blood Sugar (not taken)  01/01/2019 AM b/4 breakfast Blood Sugar 192  **Call Santiago Glad with any medication changes at [phone]

## 2019-01-22 ENCOUNTER — Other Ambulatory Visit: Payer: Self-pay | Admitting: Endocrinology

## 2019-01-28 ENCOUNTER — Ambulatory Visit (INDEPENDENT_AMBULATORY_CARE_PROVIDER_SITE_OTHER): Payer: Medicare Other | Admitting: Endocrinology

## 2019-01-28 ENCOUNTER — Telehealth: Payer: Self-pay | Admitting: Endocrinology

## 2019-01-28 ENCOUNTER — Encounter: Payer: Self-pay | Admitting: Endocrinology

## 2019-01-28 ENCOUNTER — Other Ambulatory Visit: Payer: Self-pay

## 2019-01-28 VITALS — BP 170/86 | HR 65 | Ht 64.0 in | Wt 185.4 lb

## 2019-01-28 DIAGNOSIS — E1165 Type 2 diabetes mellitus with hyperglycemia: Secondary | ICD-10-CM

## 2019-01-28 DIAGNOSIS — Z794 Long term (current) use of insulin: Secondary | ICD-10-CM | POA: Diagnosis not present

## 2019-01-28 LAB — GLUCOSE, POCT (MANUAL RESULT ENTRY): POC Glucose: 57 mg/dl — AB (ref 70–99)

## 2019-01-28 NOTE — Telephone Encounter (Signed)
She will continue the same 14 units before dinner.  She will take 2 tablets of repaglinide at lunchtime

## 2019-01-28 NOTE — Progress Notes (Signed)
Patient ID: Vanessa Craig, female   DOB: [birth year], 85 y.o.   MRN: [id number]   Reason for Appointment: Diabetes follow-up   History of Present Illness   Diagnosis: Type 2 DIABETES MELITUS, date of diagnosis: 1995        She has had mild and usually well-controlled diabetes for several years Previously on metformin and this was stopped because of renal dysfunction Also has been tried on low dose basal insulin previously However with using GLP-1 drugs like Byetta and Victoza her blood sugars have been fairly well controlled She has been on Victoza since 2011   Because of significantly high readings including over 300 in October 2017 she was started on low-dose premixed insulin at suppertime  Recent history:  Non-insulin hypoglycemic drugs:.  Actos 15 mg daily  INSULIN regimen: NovoLog Mix 16 units before breakfast and dinner, TRESIBA 10 units daily  Her A1c is relatively better at 8.7    Current management, blood sugar patterns and problems identified:  Because of tendency to low sugars with NovoLog 10 units in the morning she has been switched back to NovoLog mix insulin  Blood sugars are overall higher she has not had as much tendency to hypoglycemia  She has however not checked her sugars around lunchtime and today blood was 52 before lunch and she had minimal symptoms today, may have felt a little hot and slightly weak  Also her daughter thinks that sometimes midday she may get shaky on weekends but usually not checking sugar at that time since she is not at home  Blood sugars are markedly increased at suppertime with average almost 300 however for unknown reasons her sugar was much better at 117 yesterday  She has some readings at bedtime which are slightly better with taking her premixed insulin  FASTING blood sugars are averaging over 200 mg oils 143 today and 104 previously  She had not had any food since breakfast when she usually eats cereal only and  only sometimes will have some cheese  Does not feel excessively thirsty  She may have some snacks in the afternoons such as pudding cups  Her daughter is usually present at breakfast and suppertime to help administer her insulin usually the bottom   Hypoglycemia: None  Side effects from medications: Nausea from Victoza  GLUCOSE MONITORING FREQUENCY:  Currently 4 times a day.    Glucometer: One Touch Verio       Blood Glucose readings    PRE-MEAL Fasting Lunch Dinner Bedtime Overall  Glucose range:  104-326      Mean/median:  202   269   242   POST-MEAL PC Breakfast PC Lunch PC Dinner  Glucose range:     Mean/median:    226     Previous readings: PRE-MEAL Fasting Lunch Dinner Bedtime Overall  Glucose range:   47-289     Mean/median:  215  111  243  217 186   POST-MEAL PC Breakfast PC Lunch PC Dinner  Glucose range:     Mean/median:    191     Meals: 3 meals per day. Dinner 6-7pm  Special K Cereal or raisin bran, rarely eggs, variable carbohydrate intake at other meals         Physical activity: exercise:  some walking when the weather is good  Dietician visit: Most recent: Years ago          Wt Readings from Last 3 Encounters:  01/28/19 185 lb 6.4 oz (84.1 kg)  12/17/18 181 lb (82.1 kg)  10/29/18 184 lb (83.5 kg)    Lab Results  Component Value Date   HGBA1C 8.7 (A) 12/17/2018   HGBA1C 9.3 (A) 08/23/2018   HGBA1C 8.4 (A) 04/26/2018   Lab Results  Component Value Date   MICROALBUR 8.4 (H) 01/18/2018   LDLCALC 125 (H) 04/26/2018   CREATININE 1.58 (H) 08/23/2018     OTHER active problems: See review of systems   Office Visit on 01/28/2019  Component Date Value Ref Range Status  . POC Glucose 01/28/2019 57* 70 - 99 mg/dl Final    Allergies as of 01/28/2019      Reactions   Codeine Nausea And Vomiting      Medication List       Accurate as of January 28, 2019  4:09 PM. Always use your most recent med list.        amLODipine 5 MG  tablet Commonly known as:  NORVASC Take 5 mg by mouth daily. TAKE 1 TABLET BY MOUTH ONCE DAILY.   aspirin 81 MG tablet Take 81 mg by mouth at bedtime.   B-D ULTRAFINE III SHORT PEN 31G X 8 MM Misc Generic drug:  Insulin Pen Needle Use 2 daily   carvedilol 6.25 MG tablet Commonly known as:  COREG Take 0.5 tablets (3.125 mg total) by mouth 2 (two) times daily with a meal. Do not take the second if HR <55 BPM   Fish Oil 1000 MG Caps Take 1,000 mg by mouth daily.   furosemide 20 MG tablet Commonly known as:  LASIX Take 20 mg by mouth. TAKE 1 TABLET BY MOUTH DAILY FOR ANKLE SWELLING.   glucose blood test strip Commonly known as:  ACCU-CHEK AVIVA PLUS 1 each by Other route 4 (four) times daily. Use to check blood sugar twice daily.   glucose blood test strip Commonly known as:  ONETOUCH VERIO Use to test blood sugars four times daily. DX:E11.65   guanFACINE 2 MG tablet Commonly known as:  TENEX Take 2 mg by mouth at bedtime.   insulin degludec 100 UNIT/ML Sopn FlexTouch Pen Commonly known as:  TRESIBA FLEXTOUCH Inject 0.12 mLs (12 Units total) into the skin daily.   lansoprazole 15 MG capsule Commonly known as:  PREVACID TAKE 1 CAPSULE BY MOUTH FOR STOMACH ACID ONCE A DAY IN THE MORNINGS   meclizine 12.5 MG tablet Commonly known as:  ANTIVERT Take 12.5 mg by mouth daily as needed for dizziness.   multivitamin with minerals Tabs tablet Take 1 tablet by mouth daily.   pioglitazone 15 MG tablet Commonly known as:  ACTOS Take 1 tablet (15 mg total) by mouth daily.   raloxifene 60 MG tablet Commonly known as:  EVISTA TAKE 1 TABLET BY MOUTH EVERY DAY   ramipril 10 MG capsule Commonly known as:  ALTACE Take 1 capsule (10 mg total) by mouth 2 (two) times daily. Do not take second-PM dose if systolic < 329   ranitidine 300 MG tablet Commonly known as:  ZANTAC TAKE 1 TABLET BY MOUTH EVERYDAY AT BEDTIME   rosuvastatin 10 MG tablet Commonly known as:  CRESTOR TAKE 1  TABLET BY MOUTH EVERY DAY   traZODone 50 MG tablet Commonly known as:  DESYREL Take 50 mg by mouth at bedtime as needed for sleep.   Vitamin D3 75 MCG (3000 UT) Tabs Take 1 capsule by mouth at bedtime.       Allergies:  Allergies  Allergen Reactions  . Codeine Nausea And Vomiting  Past Medical History:  Diagnosis Date  . Chronic kidney disease, stage III (moderate) ([place]) 08/06/2013  . Dementia ([place])   . Diabetes mellitus without complication ([place])   . DM (diabetes mellitus) ([place]) 07/31/2013  . Esophageal reflux 03/08/2018  . Essential hypertension 08/24/2016  . Hypertension   . Obesity 03/08/2018  . Other and unspecified hyperlipidemia 08/06/2013  . PUD (peptic ulcer disease) 03/08/2018  . Sinus bradycardia 03/08/2018  . Unspecified essential hypertension 08/04/2013    Past Surgical History:  Procedure Laterality Date  . APPENDECTOMY      Family History  Problem Relation Age of Onset  . Hypertension Mother   . Heart disease Father   . Heart attack Father     Social History:  reports that she has never smoked. She has never used smokeless tobacco. She reports that she does not drink alcohol or use drugs.  Review of Systems:  THYROID NODULE:  This was incidentally discovered ULTRASOUND of thyroid was done by her PCP and showed a 3.4 cm mostly solid isoechoic nodule, this was done on 05/29/2018     Osteopenia: She is on Evista from her PCP   HYPERTENSION:  she has had long-standing hypertension treated with multiple drugs including 10 mg ramipril Followed by PCP  BP Readings from Last 3 Encounters:  01/28/19 (!) 170/86  12/17/18 120/70  10/29/18 (!) 144/88   EDEMA: She has had mild edema only, currently on 20 mg Lasix  Renal dysfunction: She has had persistent renal dysfunction but this has been  stable for several years   Lab Results  Component Value Date   CREATININE 1.58 (H) 08/23/2018    HYPERLIPIDEMIA: The lipid abnormality consists of elevated LDL  and high triglycerides  She is taking Crestor instead of pravastatin 80 mg and fenofibrate 160 mg daily Previously her LDL was well over 100   Lab Results  Component Value Date   CHOL 166 08/23/2018   HDL 36.70 (L) 08/23/2018   LDLCALC 125 (H) 04/26/2018   LDLDIRECT 83.0 08/23/2018   TRIG 379.0 (H) 08/23/2018   CHOLHDL 5 08/23/2018     Examination:   BP (!) 170/86 (BP Location: Left Arm, Patient Position: Sitting, Cuff Size: Normal)   Pulse 65   Ht 5\' 4"  (1.626 m)   Wt 185 lb 6.4 oz (84.1 kg)   SpO2 95%   BMI 31.82 kg/m   Body mass index is 31.82 kg/m.       ASSESSMENT/ PLAN:   Diabetes type 2 long-standing  See history of present illness for detailed discussion of  current management, blood sugar patterns and problems identified  Her A1c has been around 8-9  However her average blood sugar at home is recently 246 This is despite taking a total of 42 units of insulin daily Although she is not more able to keep up with premixed insulin compared to NovoLog she is probably tending to have low blood sugars before lunch This is partly from not eating much protein in the morning Blood sugars are markedly increased at suppertime usually for lunch and probably some afternoon snacks   Recommendations:  Reduce morning premixed insulin down to 12 units to avoid low sugars at lunchtime  Continue Actos  May try 1 mg Prandin for now at lunchtime  Once she is able to start insulin at lunchtime she can start taking at least 6 units of premixed insulin at that time units  Follow-up in 6 weeks but will have her daughter call if blood  sugars not better by next week  Continue Tresiba 10 units  Her daughter will try to see if she can get some nursing help to help her with the insulin and otherwise look at assisted living  Labs to be requested from PCP   LIPIDS: Needs follow-up lab results from PCP  Counseling time on subjects discussed in assessment and plan sections is  over 50% of today's 25 minute visit   Patient Instructions  Reduce am insulin to 12  Take 6 units of Mix at lunch  Tresiba 10 units daily  Take Prandin at lunch    Elayne Snare 01/28/2019, 4:09 PM

## 2019-01-28 NOTE — Telephone Encounter (Signed)
Patients daughter Brigitte Pulse called with questions about changes in her mothers insulin. Would like a call back at [phone] to discuss changes

## 2019-01-28 NOTE — Patient Instructions (Signed)
Reduce am insulin to 12  Take 6 units of Mix at lunch  Tresiba 10 units daily  Take Prandin at lunch

## 2019-01-28 NOTE — Telephone Encounter (Signed)
Called pt's daughter and clarified this for her. She verbalized understanding.

## 2019-01-28 NOTE — Telephone Encounter (Signed)
Pt's daughter called and requested clarification on the instructions after the visit today. She would like to know the following: Does she discontinue taking insulin mix at dinnertime? This was not addressed, but breakfast and lunch was. How much Prandin is she supposed to be taking?

## 2019-02-10 ENCOUNTER — Other Ambulatory Visit: Payer: Self-pay

## 2019-02-10 MED ORDER — GLUCOSE BLOOD VI STRP: 1.0000 | ORAL_STRIP | Freq: Four times a day (QID) | 11 refills | Status: DC

## 2019-02-10 MED ORDER — ACCU-CHEK FASTCLIX LANCET KIT: PACK | 0 refills | Status: DC

## 2019-02-10 MED ORDER — ACCU-CHEK FASTCLIX LANCETS MISC: 3 refills | Status: DC

## 2019-02-10 MED ORDER — ACCU-CHEK AVIVA PLUS W/DEVICE KIT: PACK | 0 refills | Status: DC

## 2019-02-11 ENCOUNTER — Other Ambulatory Visit: Payer: Self-pay

## 2019-02-11 MED ORDER — GLUCOSE BLOOD VI STRP: ORAL_STRIP | 11 refills | Status: DC

## 2019-02-12 ENCOUNTER — Other Ambulatory Visit: Payer: Self-pay

## 2019-02-18 ENCOUNTER — Telehealth: Payer: Self-pay | Admitting: Endocrinology

## 2019-02-18 ENCOUNTER — Other Ambulatory Visit: Payer: Self-pay

## 2019-02-18 MED ORDER — GLUCOSE BLOOD VI STRP: ORAL_STRIP | 3 refills | Status: DC

## 2019-02-18 NOTE — Telephone Encounter (Signed)
MEDICATION: Verio Test Strips  PHARMACY:  [facility] [place]  IS THIS A 90 DAY SUPPLY :   IS PATIENT OUT OF MEDICATION:   IF NOT; HOW MUCH IS LEFT:   LAST APPOINTMENT DATE: @3 /18/2020  NEXT APPOINTMENT DATE:@5 /02/2019  DO WE HAVE YOUR PERMISSION TO LEAVE A DETAILED MESSAGE:  OTHER COMMENTS:  States that received accu-check test strips and they no longer use that type of meter. Also, they do not use [facility].  **Let patient know to contact pharmacy at the end of the day to make sure medication is ready. **  ** Please notify patient to allow 48-72 hours to process**  **Encourage patient to contact the pharmacy for refills or they can request refills through [facility]**

## 2019-02-18 NOTE — Telephone Encounter (Signed)
Rx sent 

## 2019-02-19 ENCOUNTER — Other Ambulatory Visit: Payer: Self-pay | Admitting: Endocrinology

## 2019-02-23 ENCOUNTER — Other Ambulatory Visit: Payer: Self-pay | Admitting: Endocrinology

## 2019-02-28 ENCOUNTER — Other Ambulatory Visit: Payer: Self-pay

## 2019-02-28 MED ORDER — REPAGLINIDE 1 MG PO TABS: ORAL_TABLET | ORAL | 1 refills | Status: DC

## 2019-03-03 DIAGNOSIS — I10 Essential (primary) hypertension: Secondary | ICD-10-CM | POA: Diagnosis not present

## 2019-03-07 ENCOUNTER — Other Ambulatory Visit: Payer: Self-pay | Admitting: Internal Medicine

## 2019-03-31 ENCOUNTER — Other Ambulatory Visit: Payer: Self-pay | Admitting: Cardiology

## 2019-03-31 ENCOUNTER — Other Ambulatory Visit: Payer: Medicare Other

## 2019-04-02 ENCOUNTER — Ambulatory Visit (INDEPENDENT_AMBULATORY_CARE_PROVIDER_SITE_OTHER): Payer: Medicare Other | Admitting: Endocrinology

## 2019-04-02 ENCOUNTER — Encounter: Payer: Self-pay | Admitting: Endocrinology

## 2019-04-02 DIAGNOSIS — E1165 Type 2 diabetes mellitus with hyperglycemia: Secondary | ICD-10-CM | POA: Diagnosis not present

## 2019-04-02 DIAGNOSIS — Z794 Long term (current) use of insulin: Secondary | ICD-10-CM

## 2019-04-02 NOTE — Progress Notes (Signed)
Patient ID: Vanessa Craig, female   DOB: 1928/07/13, 85 y.o.   MRN: [id number]   Today's office visit was provided via telemedicine using audio technique Explained to the patient and the the limitations of evaluation and management by telemedicine and the availability of in person appointments.  The patient understood the limitations and agreed to proceed. Patient also understood that the telehealth visit is billable. . Location of the patient: Home . Location of the provider: Office Only the patient and myself were participating in the encounter    Reason for Appointment: Diabetes follow-up   History of Present Illness   Diagnosis: Type 2 DIABETES MELITUS, date of diagnosis: 1995        She has had mild and usually well-controlled diabetes for several years Previously on metformin and this was stopped because of renal dysfunction Also has been tried on low dose basal insulin previously However with using GLP-1 drugs like Byetta and Victoza her blood sugars have been fairly well controlled She has been on Victoza since 2011   Because of significantly high readings including over 300 in October 2017 she was started on low-dose premixed insulin at suppertime  Recent history:  Non-insulin hypoglycemic drugs:.  Actos 15 mg daily, Prandin 2 mg at lunch  INSULIN regimen: NovoLog Mix 12 before breakfast, 6 before lunch and 14 before dinner , TRESIBA 10 units daily  Her A1c is relatively better at 8.7    Current management, blood sugar patterns and problems identified:  Even without any significant change in her insulin regimen since her last visit her blood sugars appear to be recently much better  However Prandin was added at lunchtime since she had high readings at dinnertime frequently  Although she has one high reading before dinnertime after eating cake she has relatively good blood sugars before dinnertime with some variability but much better than before   Also morning sugars are not fluctuating as much and fairly stable  She has not reported any hypoglycemia  However she has not checked any blood sugars before lunch or bedtime  Her daughter is bringing her dinner in the evening and she is probably eating healthier in the evening  Still eating cereal in the morning but tries to get the piece of cheese also in the morning  Because of being at home only she is not doing any walking or exercise  Has been quite consistent with taking her insulin before meals as directed as well as oral drugs   Hypoglycemia: As above  Side effects from medications: Nausea from [place]:  Currently about 2 times a day.    Glucometer: One Touch Verio       Blood Glucose readings given from the meter by her daughter   PRE-MEAL Fasting Lunch Dinner Bedtime Overall  Glucose range:  109-159   85-312    Mean/median:     ?   POST-MEAL PC Breakfast PC Lunch PC Dinner  Glucose range:     Mean/median:      Previous readings:  PRE-MEAL Fasting Lunch Dinner Bedtime Overall  Glucose range:  104-326      Mean/median:  202   269   242   POST-MEAL PC Breakfast PC Lunch PC Dinner  Glucose range:     Mean/median:    226      Meals: 3 meals per day. Dinner 6-7pm  Special K Cereal or raisin bran, rarely eggs, variable carbohydrate intake at other meals  Physical activity: exercise:  some walking when the weather is good  Dietician visit: Most recent: Years ago          Wt Readings from Last 3 Encounters:  01/28/19 185 lb 6.4 oz (84.1 kg)  12/17/18 181 lb (82.1 kg)  10/29/18 184 lb (83.5 kg)    Lab Results  Component Value Date   HGBA1C 8.7 (A) 12/17/2018   HGBA1C 9.3 (A) 08/23/2018   HGBA1C 8.4 (A) 04/26/2018   Lab Results  Component Value Date   MICROALBUR 8.4 (H) 01/18/2018   LDLCALC 125 (H) 04/26/2018   CREATININE 1.58 (H) 08/23/2018     OTHER active problems: See review of systems   No visits with  results within 1 Week(s) from this visit.  Latest known visit with results is:  Office Visit on 01/28/2019  Component Date Value Ref Range Status  . POC Glucose 01/28/2019 57* 70 - 99 mg/dl Final    Allergies as of 04/02/2019      Reactions   Codeine Nausea And Vomiting      Medication List       Accurate as of Apr 02, 2019 10:25 AM. Always use your most recent med list.        Accu-Chek Aviva Plus w/Device Kit Use as instructed to check blood sugar 4 times daily.   Accu-Chek [employer] Kit Use as instructed to check blood sugar 4 times daily.   Accu-Chek FastClix Lancets Misc Use as instructed to check blood sugar 4 times daily.   amLODipine 5 MG tablet Commonly known as:  NORVASC Take 5 mg by mouth daily. TAKE 1 TABLET BY MOUTH ONCE DAILY.   aspirin 81 MG tablet Take 81 mg by mouth at bedtime.   carvedilol 6.25 MG tablet Commonly known as:  COREG Take 0.5 tablets (3.125 mg total) by mouth 2 (two) times daily with a meal. Do not take the second if HR <55 BPM   Fish Oil 1000 MG Caps Take 1,000 mg by mouth daily.   furosemide 20 MG tablet Commonly known as:  LASIX Take 20 mg by mouth. TAKE 1 TABLET BY MOUTH DAILY FOR ANKLE SWELLING.   glucose blood test strip Commonly known as:  Accu-Chek Aviva Plus Use to check blood sugar twice daily.   glucose blood test strip Use onetouch verio strips as instructed to check blood sugar twice daily. DX:E11.65   guanFACINE 2 MG tablet Commonly known as:  TENEX Take 2 mg by mouth at bedtime.   insulin aspart protamine - aspart (70-30) 100 UNIT/ML FlexPen Commonly known as:  NovoLOG Mix 70/30 FlexPen INJECT 14 UNITS WITH BREAKFAST AND SUPPER   insulin degludec 100 UNIT/ML Sopn FlexTouch Pen Commonly known as:  Tyler Aas FlexTouch Inject 0.12 mLs (12 Units total) into the skin daily.   Insulin Pen Needle 31G X 8 MM Misc Commonly known as:  B-D ULTRAFINE III SHORT PEN Use as instructed   lansoprazole 15 MG capsule  Commonly known as:  PREVACID TAKE 1 CAPSULE BY MOUTH FOR STOMACH ACID ONCE A DAY IN THE MORNINGS   meclizine 12.5 MG tablet Commonly known as:  ANTIVERT Take 12.5 mg by mouth daily as needed for dizziness.   multivitamin with minerals Tabs tablet Take 1 tablet by mouth daily.   pioglitazone 15 MG tablet Commonly known as:  ACTOS Take 1 tablet (15 mg total) by mouth daily.   raloxifene 60 MG tablet Commonly known as:  EVISTA TAKE 1 TABLET BY MOUTH EVERY DAY  ramipril 10 MG capsule Commonly known as:  ALTACE TAKE 1 CAPSULE (10 MG TOTAL) BY MOUTH 2 (TWO) TIMES DAILY. DO NOT TAKE SECOND-PM DOSE IF SYSTOLIC < 062   ranitidine 300 MG tablet Commonly known as:  ZANTAC TAKE 1 TABLET BY MOUTH EVERYDAY AT BEDTIME   repaglinide 1 MG tablet Commonly known as:  PRANDIN Take 1 tablet by mouth with lunch.   rosuvastatin 10 MG tablet Commonly known as:  CRESTOR TAKE 1 TABLET BY MOUTH EVERY DAY   traZODone 50 MG tablet Commonly known as:  DESYREL Take 50 mg by mouth at bedtime as needed for sleep.   Vitamin D3 75 MCG (3000 UT) Tabs Take 1 capsule by mouth at bedtime.       Allergies:  Allergies  Allergen Reactions  . Codeine Nausea And Vomiting    Past Medical History:  Diagnosis Date  . Chronic kidney disease, stage III (moderate) ([place]) 08/06/2013  . Dementia ([place])   . Diabetes mellitus without complication ([place])   . DM (diabetes mellitus) ([place]) 07/31/2013  . Esophageal reflux 03/08/2018  . Essential hypertension 08/24/2016  . Hypertension   . Obesity 03/08/2018  . Other and unspecified hyperlipidemia 08/06/2013  . PUD (peptic ulcer disease) 03/08/2018  . Sinus bradycardia 03/08/2018  . Unspecified essential hypertension 08/04/2013    Past Surgical History:  Procedure Laterality Date  . APPENDECTOMY      Family History  Problem Relation Age of Onset  . Hypertension Mother   . Heart disease Father   . Heart attack Father     Social History:  reports that she has  never smoked. She has never used smokeless tobacco. She reports that she does not drink alcohol or use drugs.  Review of Systems:  THYROID NODULE:  This was incidentally discovered ULTRASOUND of thyroid was done by her PCP and showed a 3.4 cm mostly solid isoechoic nodule, this was done on 05/29/2018     Osteopenia: She is on Evista from her PCP   HYPERTENSION:  she has had long-standing hypertension treated with multiple drugs including 10 mg ramipril Followed by PCP Recently amlodipine was increased to 10 mg by her PCP Today blood pressure at home was 124/59 although previously higher  BP Readings from Last 3 Encounters:  01/28/19 (!) 170/86  12/17/18 120/70  10/29/18 (!) 144/88   EDEMA: She has had less edema recently, currently on 20 mg Lasix  Renal dysfunction: She has had persistent renal dysfunction but this has been  stable for several years   Lab Results  Component Value Date   CREATININE 1.58 (H) 08/23/2018    HYPERLIPIDEMIA: The lipid abnormality consists of elevated LDL and high triglycerides  She is taking Crestor 10 mg and fenofibrate 160 mg daily Previously her LDL was well over 100, no recent labs available from other treating physicians   Lab Results  Component Value Date   CHOL 166 08/23/2018   HDL 36.70 (L) 08/23/2018   LDLCALC 125 (H) 04/26/2018   LDLDIRECT 83.0 08/23/2018   TRIG 379.0 (H) 08/23/2018   CHOLHDL 5 08/23/2018     Examination:   There were no vitals taken for this visit.  There is no height or weight on file to calculate BMI.       ASSESSMENT/ PLAN:   Diabetes type 2 long-standing  See history of present illness for detailed discussion of  current management, blood sugar patterns and problems identified  Her A1c has been usually around 8-9  However her blood  sugar readings recently at home for the last 2 to 3 weeks have been much better for no apparent reason Her daughter is still helping her with her medications, insulin  and watching her meals probably more consistently than before Her weight reportedly is about the same She has had fairly stable blood sugars before breakfast and only occasionally high readings at dinnertime No readings being checked after meals   Recommendations:  Reduce lunchtime premixed insulin down to 4 units instead of 6 and may consider stopping this  Continue Prandin at lunchtime  She will try to rotate when she checks her sugars and check more readings midmorning and after supper  To call if blood sugars are starting to get low at any time  No change in Actos unless she is having any increasing edema   Labs to be done by PCP   LIPIDS: Needs follow-up lab results from PCP  Total phone call visit time today =7 minutes   There are no Patient Instructions on file for this visit.   Elayne Snare 04/02/2019, 10:25 AM

## 2019-04-25 ENCOUNTER — Telehealth: Payer: Self-pay | Admitting: Endocrinology

## 2019-04-25 NOTE — Telephone Encounter (Signed)
Called pt's daughter to get list of blood sugars and she was not with the pt. Pt's daughter said she was just being cautious and this is the only low blood sugars she has had, but she wanted to know if she should do anything differently.

## 2019-04-25 NOTE — Telephone Encounter (Signed)
Patients daughter Santiago Glad called in regards to the patients blood sugar being 69 one morning and another morning 80.  Please Advise with daughter, Thanks  PH# [phone]

## 2019-04-28 DIAGNOSIS — K219 Gastro-esophageal reflux disease without esophagitis: Secondary | ICD-10-CM | POA: Diagnosis not present

## 2019-04-28 DIAGNOSIS — Z6832 Body mass index (BMI) 32.0-32.9, adult: Secondary | ICD-10-CM | POA: Diagnosis not present

## 2019-04-28 DIAGNOSIS — E785 Hyperlipidemia, unspecified: Secondary | ICD-10-CM | POA: Diagnosis not present

## 2019-04-28 DIAGNOSIS — Z79899 Other long term (current) drug therapy: Secondary | ICD-10-CM | POA: Diagnosis not present

## 2019-04-28 DIAGNOSIS — E1165 Type 2 diabetes mellitus with hyperglycemia: Secondary | ICD-10-CM | POA: Diagnosis not present

## 2019-04-28 DIAGNOSIS — G4700 Insomnia, unspecified: Secondary | ICD-10-CM | POA: Diagnosis not present

## 2019-04-28 DIAGNOSIS — I10 Essential (primary) hypertension: Secondary | ICD-10-CM | POA: Diagnosis not present

## 2019-04-29 ENCOUNTER — Other Ambulatory Visit: Payer: Self-pay

## 2019-04-29 MED ORDER — INSULIN ASPART PROT & ASPART (70-30 MIX) 100 UNIT/ML PEN: PEN_INJECTOR | SUBCUTANEOUS | 1 refills | Status: DC

## 2019-04-29 MED ORDER — GLUCOSE BLOOD VI STRP: ORAL_STRIP | 11 refills | Status: DC

## 2019-04-29 MED ORDER — ACCU-CHEK AVIVA PLUS W/DEVICE KIT: PACK | 0 refills | Status: DC

## 2019-04-29 MED ORDER — ACCU-CHEK FASTCLIX LANCETS MISC: 3 refills | Status: AC

## 2019-04-29 MED ORDER — ACCU-CHEK FASTCLIX LANCET KIT: PACK | 0 refills | Status: AC

## 2019-05-20 DIAGNOSIS — L600 Ingrowing nail: Secondary | ICD-10-CM | POA: Diagnosis not present

## 2019-05-20 DIAGNOSIS — E1142 Type 2 diabetes mellitus with diabetic polyneuropathy: Secondary | ICD-10-CM | POA: Diagnosis not present

## 2019-05-20 DIAGNOSIS — B351 Tinea unguium: Secondary | ICD-10-CM | POA: Diagnosis not present

## 2019-05-20 LAB — HM DIABETES FOOT EXAM: HM Diabetic Foot Exam: NORMAL

## 2019-05-26 ENCOUNTER — Other Ambulatory Visit: Payer: Self-pay | Admitting: Endocrinology

## 2019-05-27 ENCOUNTER — Encounter: Payer: Self-pay | Admitting: Endocrinology

## 2019-05-27 ENCOUNTER — Other Ambulatory Visit: Payer: Self-pay

## 2019-05-27 ENCOUNTER — Ambulatory Visit (INDEPENDENT_AMBULATORY_CARE_PROVIDER_SITE_OTHER): Payer: Medicare Other | Admitting: Endocrinology

## 2019-05-27 VITALS — BP 148/70 | HR 56 | Temp 98.7°F | Ht 64.0 in | Wt 192.0 lb

## 2019-05-27 DIAGNOSIS — E782 Mixed hyperlipidemia: Secondary | ICD-10-CM

## 2019-05-27 DIAGNOSIS — E1165 Type 2 diabetes mellitus with hyperglycemia: Secondary | ICD-10-CM | POA: Diagnosis not present

## 2019-05-27 DIAGNOSIS — E041 Nontoxic single thyroid nodule: Secondary | ICD-10-CM | POA: Diagnosis not present

## 2019-05-27 DIAGNOSIS — Z794 Long term (current) use of insulin: Secondary | ICD-10-CM | POA: Diagnosis not present

## 2019-05-27 LAB — POCT GLYCOSYLATED HEMOGLOBIN (HGB A1C): Hemoglobin A1C: 7.3 % — AB (ref 4.0–5.6)

## 2019-05-27 MED ORDER — ONETOUCH VERIO VI STRP: ORAL_STRIP | 5 refills | Status: DC

## 2019-05-27 NOTE — Patient Instructions (Addendum)
12 units at dinner also   Check blood sugars on 1-2  waking up days a week  Also check blood sugars about 2 hours after meals and do this after different meals by rotation  Recommended blood sugar levels on waking up are 90-130 and about 2 hours after meal is 130-180  Please bring your blood sugar monitor to each visit, thank you  Walk daily

## 2019-05-27 NOTE — Progress Notes (Signed)
Patient ID: Vanessa Craig, female   DOB: May 08, 1928, 85 y.o.   MRN: [id number]     Reason for Appointment: Diabetes follow-up   History of Present Illness   Diagnosis: Type 2 DIABETES MELITUS, date of diagnosis: 1995        She has had mild and usually well-controlled diabetes for several years Previously on metformin and this was stopped because of renal dysfunction Also has been tried on low dose basal insulin previously However with using GLP-1 drugs like Byetta and Victoza her blood sugars have been fairly well controlled She has been on Victoza since 2011   Because of significantly high readings including over 300 in October 2017 she was started on low-dose premixed insulin at suppertime  Recent history:  Non-insulin hypoglycemic drugs:.  Actos 15 mg daily, Prandin 2 mg at lunch  INSULIN regimen: NovoLog Mix 12 before breakfast, 4 before lunch and 14 before dinner , TRESIBA 10 units daily  Her A1c is relatively better at 7.3 compared to 8.7    Current management, blood sugar patterns and problems identified:  She has continued to have fairly good blood sugar control  Her lunchtime dose was reduced to 4 units on her last visit last month  With this her blood sugars in the afternoon are not as low at times compared to her last visit  However recently lowest blood sugar was 81 in the morning  She appears to have however gained weight  Her daughter thinks that she is fairly consistent with taking her insulin before each meal as directed  At dinnertime her blood sugars are fluctuating and has 2 readings over 200 recently, she thinks that this may be from eating sweets occasionally  No readings done after meals as before although she thinks she had 1 reading after dinner which was not unusually high or low  She is still not motivated to go out and walk or do any specific exercise   Hypoglycemia: As above  Side effects from medications: Nausea from  [place]:  Currently about 2 times a day.    Glucometer: One Touch Verio       Blood Glucose readings reviewed from the meter, unable to download today    PRE-MEAL Fasting Lunch Dinner Bedtime Overall  Glucose range:  81-138  195  117-239 ?   Mean/median:     ?   Meals: 3 meals per day. Dinner 6-7pm  Special K Cereal or raisin bran, rarely eggs, variable carbohydrate intake at other meals          Dietician visit: Most recent: Years ago          Wt Readings from Last 3 Encounters:  05/27/19 192 lb (87.1 kg)  01/28/19 185 lb 6.4 oz (84.1 kg)  12/17/18 181 lb (82.1 kg)    Lab Results  Component Value Date   HGBA1C 7.3 (A) 05/27/2019   HGBA1C 8.7 (A) 12/17/2018   HGBA1C 9.3 (A) 08/23/2018   Lab Results  Component Value Date   MICROALBUR 8.4 (H) 01/18/2018   LDLCALC 125 (H) 04/26/2018   CREATININE 1.58 (H) 08/23/2018     OTHER active problems: See review of systems   Office Visit on 05/27/2019  Component Date Value Ref Range Status   Hemoglobin A1C 05/27/2019 7.3* 4.0 - 5.6 % Final    Allergies as of 05/27/2019      Reactions   Codeine Nausea And Vomiting      Medication List  Accurate as of May 27, 2019  1:12 PM. If you have any questions, ask your nurse or doctor.        STOP taking these medications   Accu-Chek Aviva Plus w/Device Kit Stopped by: Elayne Snare, MD     TAKE these medications   Accu-Chek FastClix Lancet Kit Use as instructed to check blood sugar 4 times daily.   Accu-Chek FastClix Lancets Misc Use as instructed to check blood sugar 4 times daily.   amLODipine 5 MG tablet Commonly known as: NORVASC Take 5 mg by mouth daily. TAKE 1 TABLET BY MOUTH ONCE DAILY.   aspirin 81 MG tablet Take 81 mg by mouth at bedtime.   carvedilol 6.25 MG tablet Commonly known as: COREG Take 0.5 tablets (3.125 mg total) by mouth 2 (two) times daily with a meal. Do not take the second if HR <55 BPM   Fish Oil 1000 MG  Caps Take 1,000 mg by mouth daily.   furosemide 20 MG tablet Commonly known as: LASIX Take 20 mg by mouth. TAKE 1 TABLET BY MOUTH DAILY FOR ANKLE SWELLING.   guanFACINE 2 MG tablet Commonly known as: TENEX Take 2 mg by mouth at bedtime.   insulin aspart protamine - aspart (70-30) 100 UNIT/ML FlexPen Commonly known as: NovoLOG Mix 70/30 FlexPen Inject 12 units under the skin at breakfast, 6 units at lunch, and 14 units at dinner.   Insulin Pen Needle 31G X 8 MM Misc Commonly known as: B-D ULTRAFINE III SHORT PEN Use as instructed   lansoprazole 15 MG capsule Commonly known as: PREVACID TAKE 1 CAPSULE BY MOUTH FOR STOMACH ACID ONCE A DAY IN THE MORNINGS   meclizine 12.5 MG tablet Commonly known as: ANTIVERT Take 12.5 mg by mouth daily as needed for dizziness.   multivitamin with minerals Tabs tablet Take 1 tablet by mouth daily.   OneTouch Verio test strip Generic drug: glucose blood Use to test blood sugars 3 times daily. DX:E11.65 What changed: additional instructions Changed by: Elayne Snare, MD   pioglitazone 15 MG tablet Commonly known as: ACTOS Take 1 tablet (15 mg total) by mouth daily.   raloxifene 60 MG tablet Commonly known as: EVISTA TAKE 1 TABLET BY MOUTH EVERY DAY   ramipril 10 MG capsule Commonly known as: ALTACE TAKE 1 CAPSULE (10 MG TOTAL) BY MOUTH 2 (TWO) TIMES DAILY. DO NOT TAKE SECOND-PM DOSE IF SYSTOLIC < 962   ranitidine 300 MG tablet Commonly known as: ZANTAC TAKE 1 TABLET BY MOUTH EVERYDAY AT BEDTIME   repaglinide 1 MG tablet Commonly known as: PRANDIN Take 1 tablet by mouth with lunch.   rosuvastatin 10 MG tablet Commonly known as: CRESTOR TAKE 1 TABLET BY MOUTH EVERY DAY   traZODone 50 MG tablet Commonly known as: DESYREL Take 50 mg by mouth at bedtime as needed for sleep.   Tyler Aas FlexTouch 100 UNIT/ML Sopn FlexTouch Pen Generic drug: insulin degludec Inject 0.1 mLs (10 Units total) into the skin daily.   Vitamin D3 75 MCG  (3000 UT) Tabs Take 1 capsule by mouth at bedtime.       Allergies:  Allergies  Allergen Reactions   Codeine Nausea And Vomiting    Past Medical History:  Diagnosis Date   Chronic kidney disease, stage III (moderate) ([place]) 08/06/2013   Dementia ([place])    Diabetes mellitus without complication ([place])    DM (diabetes mellitus) ([place]) 07/31/2013   Esophageal reflux 03/08/2018   Essential hypertension 08/24/2016   Hypertension  Obesity 03/08/2018   Other and unspecified hyperlipidemia 08/06/2013   PUD (peptic ulcer disease) 03/08/2018   Sinus bradycardia 03/08/2018   Unspecified essential hypertension 08/04/2013    Past Surgical History:  Procedure Laterality Date   APPENDECTOMY      Family History  Problem Relation Age of Onset   Hypertension Mother    Heart disease Father    Heart attack Father     Social History:  reports that she has never smoked. She has never used smokeless tobacco. She reports that she does not drink alcohol or use drugs.  Review of Systems:  THYROID NODULE:  This was incidentally discovered ULTRASOUND of thyroid was done by her PCP and showed a 3.4 cm mostly solid isoechoic nodule, this was done on 05/29/2018     Osteopenia: She is on Evista from her PCP   HYPERTENSION:  she has had long-standing hypertension treated with multiple drugs including 10 mg ramipril Followed by PCP Recently amlodipine was increased to 10 mg by her PCP   BP Readings from Last 3 Encounters:  05/27/19 (!) 148/70  01/28/19 (!) 170/86  12/17/18 120/70   EDEMA: She has had less edema recently, currently on 20 mg Lasix  Renal dysfunction: She has had persistent renal dysfunction but this has been  stable for several years   Lab Results  Component Value Date   CREATININE 1.58 (H) 08/23/2018    HYPERLIPIDEMIA: The lipid abnormality consists of elevated LDL and high triglycerides  She is taking Crestor 10 mg and fenofibrate 160 mg daily Previously  her LDL was well over 100, no recent labs available from other treating physicians   Lab Results  Component Value Date   CHOL 166 08/23/2018   HDL 36.70 (L) 08/23/2018   LDLCALC 125 (H) 04/26/2018   LDLDIRECT 83.0 08/23/2018   TRIG 379.0 (H) 08/23/2018   CHOLHDL 5 08/23/2018     Examination:   BP (!) 148/70    Pulse (!) 56    Temp 98.7 F (37.1 C) (Oral)    Ht _0  (1.626 m)    Wt 192 lb (87.1 kg)    SpO2 96%    BMI 32.96 kg/m   Body mass index is 32.96 kg/m.       ASSESSMENT/ PLAN:   Diabetes type 2 long-standing  See history of present illness for detailed discussion of  current management, blood sugar patterns and problems identified  Her A1c has improved significantly down to 7.3  She is on premixed insulin, Prandin and Actos now  Even with her difficulty with memory issues she is able to comply with her regimen Her daughter is generally helping also Blood sugars are fairly good but some fluctuation in the afternoon based on her diet  Fasting blood sugar may be below 90 occasionally but no hypoglycemia reported   Recommendations:  Reduce dinnertime premixed insulin down to 12 units also  She will try to rotate and check her sugars at different times of the day to help adjust her insulin  Encouraged her to start walking because of her recent tendency to gain weight   Lab results from PCP will be reviewed when available on the chart   LIPIDS: Needs follow-up with PCP periodically    Patient Instructions  12 units at dinner also   Check blood sugars on 1-2  waking up days a week  Also check blood sugars about 2 hours after meals and do this after different meals by rotation  Recommended blood  sugar levels on waking up are 90-130 and about 2 hours after meal is 130-180  Please bring your blood sugar monitor to each visit, thank you  Walk daily    Montrel Donahoe 05/27/2019, 1:12 PM

## 2019-06-03 ENCOUNTER — Ambulatory Visit: Payer: Medicare Other | Admitting: Endocrinology

## 2019-07-19 ENCOUNTER — Other Ambulatory Visit: Payer: Self-pay | Admitting: Endocrinology

## 2019-08-18 ENCOUNTER — Other Ambulatory Visit: Payer: Self-pay | Admitting: Endocrinology

## 2019-09-01 ENCOUNTER — Encounter: Payer: Self-pay | Admitting: Endocrinology

## 2019-09-01 ENCOUNTER — Other Ambulatory Visit: Payer: Self-pay

## 2019-09-01 ENCOUNTER — Ambulatory Visit (INDEPENDENT_AMBULATORY_CARE_PROVIDER_SITE_OTHER): Payer: Medicare Other | Admitting: Endocrinology

## 2019-09-01 VITALS — BP 126/60 | HR 74 | Ht 64.0 in | Wt 191.0 lb

## 2019-09-01 DIAGNOSIS — Z794 Long term (current) use of insulin: Secondary | ICD-10-CM

## 2019-09-01 DIAGNOSIS — Z23 Encounter for immunization: Secondary | ICD-10-CM

## 2019-09-01 DIAGNOSIS — E041 Nontoxic single thyroid nodule: Secondary | ICD-10-CM | POA: Diagnosis not present

## 2019-09-01 DIAGNOSIS — E782 Mixed hyperlipidemia: Secondary | ICD-10-CM | POA: Diagnosis not present

## 2019-09-01 DIAGNOSIS — E1165 Type 2 diabetes mellitus with hyperglycemia: Secondary | ICD-10-CM

## 2019-09-01 LAB — COMPREHENSIVE METABOLIC PANEL
ALT: 10 U/L (ref 0–35)
AST: 18 U/L (ref 0–37)
Albumin: 3.8 g/dL (ref 3.5–5.2)
Alkaline Phosphatase: 65 U/L (ref 39–117)
BUN: 35 mg/dL — ABNORMAL HIGH (ref 6–23)
CO2: 30 mEq/L (ref 19–32)
Calcium: 9.6 mg/dL (ref 8.4–10.5)
Chloride: 106 mEq/L (ref 96–112)
Creatinine, Ser: 1.86 mg/dL — ABNORMAL HIGH (ref 0.40–1.20)
GFR: 25.47 mL/min — ABNORMAL LOW (ref >60.00)
Glucose, Bld: 87 mg/dL (ref 70–99)
Potassium: 4.7 mEq/L (ref 3.5–5.1)
Sodium: 142 mEq/L (ref 135–145)
Total Bilirubin: 0.7 mg/dL (ref 0.2–1.2)
Total Protein: 6.6 g/dL (ref 6.0–8.3)

## 2019-09-01 LAB — LIPID PANEL
Cholesterol: 130 mg/dL (ref 0–200)
HDL: 34.9 mg/dL — ABNORMAL LOW (ref >39.00)
LDL Cholesterol: 71 mg/dL (ref 0–99)
NonHDL: 94.72
Total CHOL/HDL Ratio: 4
Triglycerides: 117 mg/dL (ref 0.0–149.0)
VLDL: 23.4 mg/dL (ref 0.0–40.0)

## 2019-09-01 LAB — POCT GLYCOSYLATED HEMOGLOBIN (HGB A1C): Hemoglobin A1C: 7.7 % — AB (ref 4.0–5.6)

## 2019-09-01 LAB — TSH: TSH: 1.65 u[IU]/mL (ref 0.35–4.50)

## 2019-09-01 NOTE — Progress Notes (Signed)
Patient ID: Vanessa Craig, female   DOB: 22-Nov-1928, 85 y.o.   MRN: [id number]     Reason for Appointment: Diabetes follow-up   History of Present Illness   Diagnosis: Type 2 DIABETES MELITUS, date of diagnosis: 1995        She has had mild and usually well-controlled diabetes for several years Previously on metformin and this was stopped because of renal dysfunction Also has been tried on low dose basal insulin previously However with using GLP-1 drugs like Byetta and Victoza her blood sugars have been fairly well controlled She has been on Victoza since 2011   Because of significantly high readings including over 300 in October 2017 she was started on low-dose premixed insulin at suppertime  Recent history:  Non-insulin hypoglycemic drugs:.  Actos 15 mg daily, Prandin 2 mg at lunch  INSULIN regimen: NovoLog Mix 12 before breakfast, 6 before lunch and 12 before dinner , TRESIBA 10 units acs  daily  Her A1c is slightly higher at 7.7 compared to 7.3     Current management, blood sugar patterns and problems identified:  She has checked her blood sugars only before breakfast and dinnertime  Even with having to take 4 injections of insulin a day she is apparently compliant with her regimen even when her daughter is not present at home  Previously had gained weight and now appears to have leveled off  She does have variability in her blood sugars and although recently mostly mildly increased fastings she has had readings as low as 103 in the morning  Also has variable readings before dinnertime  She is still eating some cereal in the morning but not clear if her sugars are going up with this  Dietary compliance can be somewhat better and sometimes with going off her diet her blood sugar will be over 200 at suppertime  She is not doing any significant exercise   Hypoglycemia: As above  Side effects from medications: Nausea from [place]:  Currently about 2 times a day.    Glucometer: One Touch Verio       Blood Glucose readings reviewed from the meter   PRE-MEAL Fasting Lunch Dinner Bedtime Overall  Glucose range:  103-199   108-239    Mean/median:  158   170   167   POST-MEAL PC Breakfast PC Lunch PC Dinner  Glucose range:  ? ?  Mean/median:       Previous readings:  PRE-MEAL Fasting Lunch Dinner Bedtime Overall  Glucose range:  81-138  195  117-239 ?   Mean/median:     ?   Meals: 3 meals per day. Dinner 6-7pm  Special K Cereal or raisin bran, rarely eggs, variable carbohydrate intake at other meals          Dietician visit: Most recent: Years ago          Wt Readings from Last 3 Encounters:  09/01/19 191 lb (86.6 kg)  05/27/19 192 lb (87.1 kg)  01/28/19 185 lb 6.4 oz (84.1 kg)    Lab Results  Component Value Date   HGBA1C 7.3 (A) 05/27/2019   HGBA1C 8.7 (A) 12/17/2018   HGBA1C 9.3 (A) 08/23/2018   Lab Results  Component Value Date   MICROALBUR 8.4 (H) 01/18/2018   LDLCALC 125 (H) 04/26/2018   CREATININE 1.58 (H) 08/23/2018     OTHER active problems: See review of systems   No visits with results within 1 Week(s) from this  visit.  Latest known visit with results is:  Office Visit on 05/27/2019  Component Date Value Ref Range Status  . Hemoglobin A1C 05/27/2019 7.3* 4.0 - 5.6 % Final    Allergies as of 09/01/2019      Reactions   Codeine Nausea And Vomiting      Medication List       Accurate as of September 01, 2019 10:49 AM. If you have any questions, ask your nurse or doctor.        Accu-Chek [employer] Kit Use as instructed to check blood sugar 4 times daily.   Accu-Chek FastClix Lancets Misc Use as instructed to check blood sugar 4 times daily.   amLODipine 5 MG tablet Commonly known as: NORVASC Take 5 mg by mouth daily. TAKE 1 TABLET BY MOUTH ONCE DAILY.   aspirin 81 MG tablet Take 81 mg by mouth at bedtime.   carvedilol 6.25 MG tablet Commonly known  as: COREG Take 0.5 tablets (3.125 mg total) by mouth 2 (two) times daily with a meal. Do not take the second if HR <55 BPM   Fish Oil 1000 MG Caps Take 1,000 mg by mouth daily.   furosemide 20 MG tablet Commonly known as: LASIX Take 20 mg by mouth. TAKE 1 TABLET BY MOUTH DAILY FOR ANKLE SWELLING.   guanFACINE 2 MG tablet Commonly known as: TENEX Take 2 mg by mouth at bedtime.   insulin aspart protamine - aspart (70-30) 100 UNIT/ML FlexPen Commonly known as: NovoLOG Mix 70/30 FlexPen Inject 12 units under the skin at breakfast, 6 units at lunch, and 14 units at dinner.   Insulin Pen Needle 31G X 8 MM Misc Commonly known as: B-D ULTRAFINE III SHORT PEN Use as instructed   lansoprazole 15 MG capsule Commonly known as: PREVACID TAKE 1 CAPSULE BY MOUTH FOR STOMACH ACID ONCE A DAY IN THE MORNINGS   meclizine 12.5 MG tablet Commonly known as: ANTIVERT Take 12.5 mg by mouth daily as needed for dizziness.   multivitamin with minerals Tabs tablet Take 1 tablet by mouth daily.   OneTouch Verio test strip Generic drug: glucose blood Use to test blood sugars 3 times daily. DX:E11.65   pioglitazone 15 MG tablet Commonly known as: ACTOS Take 1 tablet (15 mg total) by mouth daily.   raloxifene 60 MG tablet Commonly known as: EVISTA TAKE 1 TABLET BY MOUTH EVERY DAY   ramipril 10 MG capsule Commonly known as: ALTACE TAKE 1 CAPSULE (10 MG TOTAL) BY MOUTH 2 (TWO) TIMES DAILY. DO NOT TAKE SECOND-PM DOSE IF SYSTOLIC < 269   ranitidine 300 MG tablet Commonly known as: ZANTAC TAKE 1 TABLET BY MOUTH EVERYDAY AT BEDTIME   repaglinide 1 MG tablet Commonly known as: PRANDIN TAKE 1 TABLET BY MOUTH WITH LUNCH.   rosuvastatin 10 MG tablet Commonly known as: CRESTOR TAKE 1 TABLET BY MOUTH EVERY DAY   traZODone 50 MG tablet Commonly known as: DESYREL Take 50 mg by mouth at bedtime as needed for sleep.   Tyler Aas FlexTouch 100 UNIT/ML Sopn FlexTouch Pen Generic drug: insulin degludec  Inject 0.1 mLs (10 Units total) into the skin daily.   Vitamin D3 75 MCG (3000 UT) Tabs Take 1 capsule by mouth at bedtime.       Allergies:  Allergies  Allergen Reactions  . Codeine Nausea And Vomiting    Past Medical History:  Diagnosis Date  . Chronic kidney disease, stage III (moderate) 08/06/2013  . Dementia ([place])   . Diabetes mellitus  without complication ([place])   . DM (diabetes mellitus) ([place]) 07/31/2013  . Esophageal reflux 03/08/2018  . Essential hypertension 08/24/2016  . Hypertension   . Obesity 03/08/2018  . Other and unspecified hyperlipidemia 08/06/2013  . PUD (peptic ulcer disease) 03/08/2018  . Sinus bradycardia 03/08/2018  . Unspecified essential hypertension 08/04/2013    Past Surgical History:  Procedure Laterality Date  . APPENDECTOMY      Family History  Problem Relation Age of Onset  . Hypertension Mother   . Heart disease Father   . Heart attack Father     Social History:  reports that she has never smoked. She has never used smokeless tobacco. She reports that she does not drink alcohol or use drugs.  Review of Systems:  THYROID NODULE:  This was incidentally discovered ULTRASOUND of thyroid was done by her PCP and showed a 3.4 cm mostly solid isoechoic nodule, this was done on 05/29/2018    Osteopenia: She is on Evista from her PCP   HYPERTENSION:  she has had long-standing hypertension treated with multiple drugs including 10 mg ramipril Followed by PCP amlodipine was increased to 10 mg by her PCP   BP Readings from Last 3 Encounters:  09/01/19 126/60  05/27/19 (!) 148/70  01/28/19 (!) 170/86   EDEMA: She has had less edema recently, currently on 20 mg Lasix  Renal dysfunction: She has had persistent renal dysfunction but this has been  stable for several years   Lab Results  Component Value Date   CREATININE 1.58 (H) 08/23/2018    HYPERLIPIDEMIA: The lipid abnormality consists of elevated LDL and high triglycerides  She is  taking Crestor 10 mg and fenofibrate 160 mg daily Recent labs not available   Lab Results  Component Value Date   CHOL 166 08/23/2018   HDL 36.70 (L) 08/23/2018   LDLCALC 125 (H) 04/26/2018   LDLDIRECT 83.0 08/23/2018   TRIG 379.0 (H) 08/23/2018   CHOLHDL 5 08/23/2018     Examination:   BP 126/60   Pulse 74   Ht '5\' 4"'$  (1.626 m)   Wt 191 lb (86.6 kg)   SpO2 97%   BMI 32.79 kg/m   Body mass index is 32.79 kg/m.   2+ edema on the left ankle and 1+ on the right   ASSESSMENT/ PLAN:   Diabetes type 2 long-standing  See history of present illness for detailed discussion of  current management, blood sugar patterns and problems identified  Her A1c has leveled off at 7.3 Although blood sugars are somewhat higher especially fasting compared to her last visit her level of control is adequate for her age No hypoglycemia She is able to comply with her 4 injection regimen  Since she is having some chronic edema may try to stop Actos  Hypertension: Well-controlled  Recommendations:  Stop Actos and see if blood sugars still are consistent  Reminded her to check readings when morning or after evening meal at times  She should be trying to walk more regularly on a routine basis  Avoid high-fat foods  Labs to be checked today including lipids and renal function, microalbumin      LIPIDS: Needs follow-up  Influenza vaccine given   There are no Patient Instructions on file for this visit.   Elayne Snare 09/01/2019, 10:49 AM

## 2019-09-01 NOTE — Patient Instructions (Signed)
Stop Pioglitazone  Check blood sugars on waking up days a week  Also check blood sugars about 2 hours after meals and do this after different meals by rotation  Recommended blood sugar levels on waking up are 90-130 and about 2 hours after meal is 130-200  Please bring your blood sugar monitor to each visit, thank you

## 2019-09-18 DIAGNOSIS — B351 Tinea unguium: Secondary | ICD-10-CM | POA: Diagnosis not present

## 2019-09-18 DIAGNOSIS — M79674 Pain in right toe(s): Secondary | ICD-10-CM | POA: Diagnosis not present

## 2019-09-18 DIAGNOSIS — M79675 Pain in left toe(s): Secondary | ICD-10-CM | POA: Diagnosis not present

## 2019-09-29 ENCOUNTER — Other Ambulatory Visit: Payer: Self-pay | Admitting: Cardiology

## 2019-10-06 ENCOUNTER — Telehealth: Payer: Self-pay

## 2019-10-06 NOTE — Telephone Encounter (Signed)
Please get actual blood sugar readings for the last 3 days and which medication is she referring to

## 2019-10-06 NOTE — Telephone Encounter (Signed)
Patients daughter called in stating that since medication has been removed that patients sugars have been all over the place in the 200's.   Please call and advise in what to do

## 2019-10-06 NOTE — Telephone Encounter (Signed)
Pt's daughter is not with the pt right now, but she stated that this am it was 203. She stated that the medication that she is no longer taking is the Actos. Pt's daughter stated that this was removed due to possibly causing edema in the ankles. Insulin dose still the same.

## 2019-10-06 NOTE — Telephone Encounter (Signed)
Would like to try her back on Victoza 0.6 mg daily as the pioglitazone will make her feet swell again

## 2019-10-07 ENCOUNTER — Other Ambulatory Visit: Payer: Self-pay

## 2019-10-07 MED ORDER — VICTOZA 18 MG/3ML ~~LOC~~ SOPN: 0.6000 mg | PEN_INJECTOR | Freq: Every day | SUBCUTANEOUS | 2 refills | Status: DC

## 2019-10-07 NOTE — Telephone Encounter (Signed)
Called pt's daughter and left detailed voicemail with MD message. Rx has been sent.

## 2019-10-10 NOTE — Telephone Encounter (Signed)
Pts daughter called and stated that the Victoza will not work for the pt because the nausea is so significant.  Daughter questions whether increasing her dinner dose of insulin again will be sufficient? Blood sugars are highest before supper is typically at least 200 or more. Sometimes this high before breakfast, but not often.

## 2019-10-12 NOTE — Telephone Encounter (Signed)
If the sugars are the highest before dinnertime she will need to increase her lunchtime dose to 9 units for now and if still high up to 10 or 11 units.  She will also needs to sometimes check sugars at lunchtime

## 2019-10-13 NOTE — Telephone Encounter (Signed)
Called pt's daughter and gave her MD message. Pt's daughter verbalized understanding. 

## 2019-10-21 ENCOUNTER — Other Ambulatory Visit: Payer: Self-pay

## 2019-12-03 ENCOUNTER — Other Ambulatory Visit: Payer: Self-pay

## 2019-12-03 ENCOUNTER — Encounter: Payer: Self-pay | Admitting: Endocrinology

## 2019-12-03 ENCOUNTER — Ambulatory Visit (INDEPENDENT_AMBULATORY_CARE_PROVIDER_SITE_OTHER): Payer: Medicare Other | Admitting: Endocrinology

## 2019-12-03 VITALS — BP 142/70 | HR 60 | Ht 64.0 in | Wt 189.0 lb

## 2019-12-03 DIAGNOSIS — I10 Essential (primary) hypertension: Secondary | ICD-10-CM | POA: Diagnosis not present

## 2019-12-03 DIAGNOSIS — N1832 Chronic kidney disease, stage 3b: Secondary | ICD-10-CM

## 2019-12-03 DIAGNOSIS — Z794 Long term (current) use of insulin: Secondary | ICD-10-CM

## 2019-12-03 DIAGNOSIS — E1165 Type 2 diabetes mellitus with hyperglycemia: Secondary | ICD-10-CM | POA: Diagnosis not present

## 2019-12-03 LAB — COMPREHENSIVE METABOLIC PANEL
ALT: 10 U/L (ref 0–35)
AST: 17 U/L (ref 0–37)
Albumin: 3.9 g/dL (ref 3.5–5.2)
Alkaline Phosphatase: 70 U/L (ref 39–117)
BUN: 31 mg/dL — ABNORMAL HIGH (ref 6–23)
CO2: 30 mEq/L (ref 19–32)
Calcium: 9.5 mg/dL (ref 8.4–10.5)
Chloride: 104 mEq/L (ref 96–112)
Creatinine, Ser: 1.78 mg/dL — ABNORMAL HIGH (ref 0.40–1.20)
GFR: 26.78 mL/min — ABNORMAL LOW (ref >60.00)
Glucose, Bld: 225 mg/dL — ABNORMAL HIGH (ref 70–99)
Potassium: 4.5 mEq/L (ref 3.5–5.1)
Sodium: 140 mEq/L (ref 135–145)
Total Bilirubin: 0.7 mg/dL (ref 0.2–1.2)
Total Protein: 6.6 g/dL (ref 6.0–8.3)

## 2019-12-03 LAB — POCT GLYCOSYLATED HEMOGLOBIN (HGB A1C): Hemoglobin A1C: 8.5 % — AB (ref 4.0–5.6)

## 2019-12-03 NOTE — Progress Notes (Signed)
Patient ID: Vanessa Craig, female   DOB: 01-11-1928, 86 y.o.   MRN: [id number]     Reason for Appointment: Diabetes follow-up   History of Present Illness   Diagnosis: Type 2 DIABETES MELITUS, date of diagnosis: 1995        She has had mild and usually well-controlled diabetes for several years Previously on metformin and this was stopped because of renal dysfunction Also has been tried on low dose basal insulin previously However with using GLP-1 drugs like Byetta and Victoza her blood sugars have been fairly well controlled She has been on Victoza since 2011   Because of significantly high readings including over 300 in October 2017 she was started on low-dose premixed insulin at suppertime  Recent history:  Non-insulin hypoglycemic drugs:.   Prandin 2 mg at lunch  INSULIN regimen: NovoLog Mix 12 before breakfast, 9 before lunch and 12 before dinner , TRESIBA 10 units acs  daily  Her A1c is slightly higher at 8.5    Current management, blood sugar patterns and problems identified:  She has overall higher blood sugars since her last visit in October  This may be partly related to stopping her Actos which was causing edema  Blood sugars are variable before dinnertime and may have been higher over the holidays  However appears that she has usually high readings after dinner when checked, difficult to know which readings are after meals since her mealtimes are somewhat irregular  Mostly has high readings fasting also  Occasionally will have good readings before dinnertime with increasing her lunchtime coverage  Her daughter is helping her with the suppertime injection and this is done 5 to 10 minutes before eating  Usually patient takes her lunchtime dose on her own and also Prandin before eating  No hypoglycemia reported  She is still eating some cereal in the morning and does not like to change this  Her weight is slightly better but she had edema on  the last visit  She is not doing any significant exercise   Hypoglycemia: As above  Side effects from medications: Nausea from Victoza, edema from Actos  GLUCOSE MONITORING FREQUENCY:  Currently about 2 times a day.    Glucometer: One Touch Verio       Blood Glucose readings reviewed from the meter   PRE-MEAL Fasting Lunch Dinner Bedtime Overall  Glucose range:  146-190   110-299    Mean/median:  171   206   192   POST-MEAL PC Breakfast PC Lunch PC Dinner  Glucose range:    147-327  Mean/median:    229   Previous readings  PRE-MEAL Fasting Lunch Dinner Bedtime Overall  Glucose range:  103-199   108-239    Mean/median:  158   170   167   POST-MEAL PC Breakfast PC Lunch PC Dinner  Glucose range:  ? ?  Mean/median:       Meals: 3 meals per day. Dinner 6-7pm  Special K Cereal or raisin bran, rarely eggs, variable carbohydrate intake at other meals          Dietician visit: Most recent: Years ago          Wt Readings from Last 3 Encounters:  12/03/19 189 lb (85.7 kg)  09/01/19 191 lb (86.6 kg)  05/27/19 192 lb (87.1 kg)    Lab Results  Component Value Date   HGBA1C 8.5 (A) 12/03/2019   HGBA1C 7.7 (A) 09/01/2019   HGBA1C 7.3 (A) 05/27/2019  Lab Results  Component Value Date   MICROALBUR 8.4 (H) 01/18/2018   LDLCALC 71 09/01/2019   CREATININE 1.86 (H) 09/01/2019     OTHER active problems: See review of systems   Office Visit on 12/03/2019  Component Date Value Ref Range Status  . Hemoglobin A1C 12/03/2019 8.5* 4.0 - 5.6 % Final    Allergies as of 12/03/2019      Reactions   Codeine Nausea And Vomiting      Medication List       Accurate as of December 03, 2019 11:11 AM. If you have any questions, ask your nurse or doctor.        STOP taking these medications   lansoprazole 15 MG capsule Commonly known as: PREVACID Stopped by: Elayne Snare, MD   pioglitazone 15 MG tablet Commonly known as: ACTOS Stopped by: Elayne Snare, MD   ranitidine 300 MG  tablet Commonly known as: ZANTAC Stopped by: Elayne Snare, MD   Victoza 18 MG/3ML Sopn Generic drug: liraglutide Stopped by: Elayne Snare, MD     TAKE these medications   Accu-Chek FastClix Lancet Kit Use as instructed to check blood sugar 4 times daily.   Accu-Chek FastClix Lancets Misc Use as instructed to check blood sugar 4 times daily.   amLODipine 5 MG tablet Commonly known as: NORVASC Take 5 mg by mouth daily. TAKE 1 TABLET BY MOUTH ONCE DAILY.   aspirin 81 MG tablet Take 81 mg by mouth at bedtime.   carvedilol 6.25 MG tablet Commonly known as: COREG Take 0.5 tablets (3.125 mg total) by mouth 2 (two) times daily with a meal. Do not take the second if HR <55 BPM   Fish Oil 1000 MG Caps Take 1,000 mg by mouth daily.   furosemide 20 MG tablet Commonly known as: LASIX Take 20 mg by mouth. TAKE 1 TABLET BY MOUTH DAILY FOR ANKLE SWELLING.   guanFACINE 2 MG tablet Commonly known as: TENEX Take 2 mg by mouth at bedtime.   insulin aspart protamine - aspart (70-30) 100 UNIT/ML FlexPen Commonly known as: NovoLOG Mix 70/30 FlexPen Inject 12 units under the skin at breakfast, 6 units at lunch, and 14 units at dinner. What changed: additional instructions   Insulin Pen Needle 31G X 8 MM Misc Commonly known as: B-D ULTRAFINE III SHORT PEN Use as instructed   meclizine 12.5 MG tablet Commonly known as: ANTIVERT Take 12.5 mg by mouth daily as needed for dizziness.   multivitamin with minerals Tabs tablet Take 1 tablet by mouth daily.   OneTouch Verio test strip Generic drug: glucose blood Use to test blood sugars 3 times daily. DX:E11.65   raloxifene 60 MG tablet Commonly known as: EVISTA TAKE 1 TABLET BY MOUTH EVERY DAY   ramipril 10 MG capsule Commonly known as: ALTACE TAKE 1 CAPSULE (10 MG TOTAL) BY MOUTH 2 (TWO) TIMES DAILY. DO NOT TAKE SECOND-PM DOSE IF SYSTOLIC < 009   repaglinide 1 MG tablet Commonly known as: PRANDIN TAKE 1 TABLET BY MOUTH WITH LUNCH.     rosuvastatin 10 MG tablet Commonly known as: CRESTOR TAKE 1 TABLET BY MOUTH EVERY DAY   traZODone 50 MG tablet Commonly known as: DESYREL Take 50 mg by mouth at bedtime as needed for sleep.   Tyler Aas FlexTouch 100 UNIT/ML Sopn FlexTouch Pen Generic drug: insulin degludec Inject 0.1 mLs (10 Units total) into the skin daily.   Vitamin D3 75 MCG (3000 UT) Tabs Take 1 capsule by mouth at bedtime.  Allergies:  Allergies  Allergen Reactions  . Codeine Nausea And Vomiting    Past Medical History:  Diagnosis Date  . Chronic kidney disease, stage III (moderate) 08/06/2013  . Dementia ([place])   . Diabetes mellitus without complication ([place])   . DM (diabetes mellitus) ([place]) 07/31/2013  . Esophageal reflux 03/08/2018  . Essential hypertension 08/24/2016  . Hypertension   . Obesity 03/08/2018  . Other and unspecified hyperlipidemia 08/06/2013  . PUD (peptic ulcer disease) 03/08/2018  . Sinus bradycardia 03/08/2018  . Unspecified essential hypertension 08/04/2013    Past Surgical History:  Procedure Laterality Date  . APPENDECTOMY      Family History  Problem Relation Age of Onset  . Hypertension Mother   . Heart disease Father   . Heart attack Father     Social History:  reports that she has never smoked. She has never used smokeless tobacco. She reports that she does not drink alcohol or use drugs.  Review of Systems:  THYROID NODULE:  This was incidentally discovered ULTRASOUND of thyroid was done by her PCP and showed a 3.4 cm mostly solid isoechoic nodule, this was done on 05/29/2018    Osteopenia: She is on Evista from her PCP   HYPERTENSION:  she has had long-standing hypertension treated with multiple drugs including 10 mg ramipril Followed by PCP   BP Readings from Last 3 Encounters:  12/03/19 (!) 142/70  09/01/19 126/60  05/27/19 (!) 148/70   EDEMA: She has had less edema recently, continues on 20 mg Lasix per Also Actos was stopped on her last  visit  Renal dysfunction: She has had persistent renal dysfunction but this has been  stable for several years  Lab Results  Component Value Date   CREATININE 1.78 (H) 12/03/2019   CREATININE 1.86 (H) 09/01/2019   CREATININE 1.58 (H) 08/23/2018     HYPERLIPIDEMIA: The lipid abnormality consists of elevated LDL and high triglycerides  She is taking Crestor 10 mg and fenofibrate 160 mg daily Labs as follows   Lab Results  Component Value Date   CHOL 130 09/01/2019   HDL 34.90 (L) 09/01/2019   LDLCALC 71 09/01/2019   LDLDIRECT 83.0 08/23/2018   TRIG 117.0 09/01/2019   CHOLHDL 4 09/01/2019     Examination:   BP (!) 142/70 (BP Location: Left Arm, Patient Position: Sitting, Cuff Size: Normal)   Pulse 60   Ht _0  (1.626 m)   Wt 189 lb (85.7 kg)   SpO2 94%   BMI 32.44 kg/m   Body mass index is 32.44 kg/m.   Trace right ankle edema only   ASSESSMENT/ PLAN:   Diabetes type 2 long-standing  See history of present illness for detailed discussion of  current management, blood sugar patterns and problems identified  Her management is now basically insulin alone including premixed insulin and low-dose Tresiba  Her A1c has increased to 8.5 Home blood sugars are averaging about 190 although not clear which readings in the evenings are after meals May be higher because of stopping Actos  Although blood sugars are mostly high compared to her last visit her level of control is adequate for her age  She is able to comply with her 4 injection regimen with help from her daughter  No hypoglycemia  Recommendations:  Increase evening dose to 14 units of premixed insulin  Otherwise continue same regimen including Prandin at lunch   LIPIDS: Previously well controlled  RENAL dysfunction we will recheck labs today and also recheck  electrolytes/LFT We will try to recheck her microalbumin levels since she has not had this recently  HYPERTENSION: On the multiple drugs  including ramipril and blood pressure is relatively stable   Patient Instructions  Take 14 units at supper and all else same    Elayne Snare 12/03/2019, 11:11 AM

## 2019-12-03 NOTE — Patient Instructions (Signed)
Take 14 units at supper and all else same

## 2020-01-03 ENCOUNTER — Other Ambulatory Visit: Payer: Self-pay | Admitting: Internal Medicine

## 2020-01-10 ENCOUNTER — Other Ambulatory Visit: Payer: Self-pay | Admitting: Endocrinology

## 2020-01-19 ENCOUNTER — Other Ambulatory Visit: Payer: Self-pay | Admitting: Endocrinology

## 2020-01-20 ENCOUNTER — Other Ambulatory Visit: Payer: Self-pay

## 2020-01-20 MED ORDER — NOVOLOG MIX 70/30 FLEXPEN (70-30) 100 UNIT/ML ~~LOC~~ SUPN: PEN_INJECTOR | SUBCUTANEOUS | 1 refills | Status: DC

## 2020-02-17 DIAGNOSIS — L600 Ingrowing nail: Secondary | ICD-10-CM | POA: Diagnosis not present

## 2020-02-17 DIAGNOSIS — M79674 Pain in right toe(s): Secondary | ICD-10-CM | POA: Diagnosis not present

## 2020-02-17 DIAGNOSIS — B351 Tinea unguium: Secondary | ICD-10-CM | POA: Diagnosis not present

## 2020-02-17 DIAGNOSIS — M79675 Pain in left toe(s): Secondary | ICD-10-CM | POA: Diagnosis not present

## 2020-02-17 DIAGNOSIS — E1142 Type 2 diabetes mellitus with diabetic polyneuropathy: Secondary | ICD-10-CM | POA: Diagnosis not present

## 2020-02-19 DIAGNOSIS — Z794 Long term (current) use of insulin: Secondary | ICD-10-CM | POA: Diagnosis not present

## 2020-02-19 DIAGNOSIS — Z66 Do not resuscitate: Secondary | ICD-10-CM | POA: Diagnosis present

## 2020-02-19 DIAGNOSIS — I5031 Acute diastolic (congestive) heart failure: Secondary | ICD-10-CM | POA: Diagnosis not present

## 2020-02-19 DIAGNOSIS — R0902 Hypoxemia: Secondary | ICD-10-CM | POA: Diagnosis not present

## 2020-02-19 DIAGNOSIS — F0390 Unspecified dementia without behavioral disturbance: Secondary | ICD-10-CM | POA: Diagnosis present

## 2020-02-19 DIAGNOSIS — R0603 Acute respiratory distress: Secondary | ICD-10-CM | POA: Diagnosis not present

## 2020-02-19 DIAGNOSIS — E7800 Pure hypercholesterolemia, unspecified: Secondary | ICD-10-CM | POA: Diagnosis not present

## 2020-02-19 DIAGNOSIS — N179 Acute kidney failure, unspecified: Secondary | ICD-10-CM | POA: Diagnosis not present

## 2020-02-19 DIAGNOSIS — I248 Other forms of acute ischemic heart disease: Secondary | ICD-10-CM | POA: Diagnosis present

## 2020-02-19 DIAGNOSIS — Z885 Allergy status to narcotic agent status: Secondary | ICD-10-CM | POA: Diagnosis not present

## 2020-02-19 DIAGNOSIS — R918 Other nonspecific abnormal finding of lung field: Secondary | ICD-10-CM | POA: Diagnosis present

## 2020-02-19 DIAGNOSIS — Z79899 Other long term (current) drug therapy: Secondary | ICD-10-CM | POA: Diagnosis not present

## 2020-02-19 DIAGNOSIS — I130 Hypertensive heart and chronic kidney disease with heart failure and stage 1 through stage 4 chronic kidney disease, or unspecified chronic kidney disease: Secondary | ICD-10-CM | POA: Diagnosis not present

## 2020-02-19 DIAGNOSIS — I10 Essential (primary) hypertension: Secondary | ICD-10-CM | POA: Diagnosis not present

## 2020-02-19 DIAGNOSIS — J9600 Acute respiratory failure, unspecified whether with hypoxia or hypercapnia: Secondary | ICD-10-CM | POA: Diagnosis not present

## 2020-02-19 DIAGNOSIS — E1165 Type 2 diabetes mellitus with hyperglycemia: Secondary | ICD-10-CM | POA: Diagnosis not present

## 2020-02-19 DIAGNOSIS — R079 Chest pain, unspecified: Secondary | ICD-10-CM | POA: Diagnosis not present

## 2020-02-19 DIAGNOSIS — Z20822 Contact with and (suspected) exposure to covid-19: Secondary | ICD-10-CM | POA: Diagnosis not present

## 2020-02-19 DIAGNOSIS — M1990 Unspecified osteoarthritis, unspecified site: Secondary | ICD-10-CM | POA: Diagnosis present

## 2020-02-19 DIAGNOSIS — I161 Hypertensive emergency: Secondary | ICD-10-CM | POA: Diagnosis not present

## 2020-02-19 DIAGNOSIS — R0989 Other specified symptoms and signs involving the circulatory and respiratory systems: Secondary | ICD-10-CM | POA: Diagnosis not present

## 2020-02-19 DIAGNOSIS — Z7982 Long term (current) use of aspirin: Secondary | ICD-10-CM | POA: Diagnosis not present

## 2020-02-19 DIAGNOSIS — R0602 Shortness of breath: Secondary | ICD-10-CM | POA: Diagnosis not present

## 2020-02-19 DIAGNOSIS — N1832 Chronic kidney disease, stage 3b: Secondary | ICD-10-CM | POA: Diagnosis present

## 2020-02-19 DIAGNOSIS — J9601 Acute respiratory failure with hypoxia: Secondary | ICD-10-CM | POA: Diagnosis not present

## 2020-02-19 DIAGNOSIS — E1122 Type 2 diabetes mellitus with diabetic chronic kidney disease: Secondary | ICD-10-CM | POA: Diagnosis present

## 2020-02-19 DIAGNOSIS — R069 Unspecified abnormalities of breathing: Secondary | ICD-10-CM | POA: Diagnosis not present

## 2020-02-20 ENCOUNTER — Encounter: Payer: Self-pay | Admitting: Cardiology

## 2020-02-20 DIAGNOSIS — R0602 Shortness of breath: Secondary | ICD-10-CM | POA: Diagnosis not present

## 2020-02-20 DIAGNOSIS — J9600 Acute respiratory failure, unspecified whether with hypoxia or hypercapnia: Secondary | ICD-10-CM | POA: Diagnosis not present

## 2020-02-20 DIAGNOSIS — I161 Hypertensive emergency: Secondary | ICD-10-CM | POA: Diagnosis not present

## 2020-02-20 DIAGNOSIS — J9601 Acute respiratory failure with hypoxia: Secondary | ICD-10-CM | POA: Diagnosis not present

## 2020-02-20 DIAGNOSIS — I5031 Acute diastolic (congestive) heart failure: Secondary | ICD-10-CM | POA: Diagnosis not present

## 2020-02-20 DIAGNOSIS — E7800 Pure hypercholesterolemia, unspecified: Secondary | ICD-10-CM | POA: Diagnosis not present

## 2020-02-21 DIAGNOSIS — E7800 Pure hypercholesterolemia, unspecified: Secondary | ICD-10-CM | POA: Diagnosis not present

## 2020-02-21 DIAGNOSIS — J9600 Acute respiratory failure, unspecified whether with hypoxia or hypercapnia: Secondary | ICD-10-CM | POA: Diagnosis not present

## 2020-02-21 DIAGNOSIS — I161 Hypertensive emergency: Secondary | ICD-10-CM | POA: Diagnosis not present

## 2020-02-21 DIAGNOSIS — I5031 Acute diastolic (congestive) heart failure: Secondary | ICD-10-CM | POA: Diagnosis not present

## 2020-02-22 DIAGNOSIS — I161 Hypertensive emergency: Secondary | ICD-10-CM | POA: Diagnosis not present

## 2020-02-22 DIAGNOSIS — E7800 Pure hypercholesterolemia, unspecified: Secondary | ICD-10-CM | POA: Diagnosis not present

## 2020-02-22 DIAGNOSIS — I5031 Acute diastolic (congestive) heart failure: Secondary | ICD-10-CM | POA: Diagnosis not present

## 2020-02-22 DIAGNOSIS — J9600 Acute respiratory failure, unspecified whether with hypoxia or hypercapnia: Secondary | ICD-10-CM | POA: Diagnosis not present

## 2020-02-23 DIAGNOSIS — I5031 Acute diastolic (congestive) heart failure: Secondary | ICD-10-CM | POA: Diagnosis not present

## 2020-02-23 DIAGNOSIS — Z602 Problems related to living alone: Secondary | ICD-10-CM | POA: Diagnosis not present

## 2020-02-23 DIAGNOSIS — R918 Other nonspecific abnormal finding of lung field: Secondary | ICD-10-CM | POA: Diagnosis not present

## 2020-02-23 DIAGNOSIS — M1990 Unspecified osteoarthritis, unspecified site: Secondary | ICD-10-CM | POA: Diagnosis not present

## 2020-02-23 DIAGNOSIS — E7800 Pure hypercholesterolemia, unspecified: Secondary | ICD-10-CM | POA: Diagnosis not present

## 2020-02-23 DIAGNOSIS — Z79899 Other long term (current) drug therapy: Secondary | ICD-10-CM | POA: Diagnosis not present

## 2020-02-23 DIAGNOSIS — F0390 Unspecified dementia without behavioral disturbance: Secondary | ICD-10-CM | POA: Diagnosis not present

## 2020-02-23 DIAGNOSIS — I161 Hypertensive emergency: Secondary | ICD-10-CM | POA: Diagnosis not present

## 2020-02-23 DIAGNOSIS — Z7982 Long term (current) use of aspirin: Secondary | ICD-10-CM | POA: Diagnosis not present

## 2020-02-23 DIAGNOSIS — Z79810 Long term (current) use of selective estrogen receptor modulators (SERMs): Secondary | ICD-10-CM | POA: Diagnosis not present

## 2020-02-23 DIAGNOSIS — I130 Hypertensive heart and chronic kidney disease with heart failure and stage 1 through stage 4 chronic kidney disease, or unspecified chronic kidney disease: Secondary | ICD-10-CM | POA: Diagnosis not present

## 2020-02-23 DIAGNOSIS — R5381 Other malaise: Secondary | ICD-10-CM | POA: Diagnosis not present

## 2020-02-23 DIAGNOSIS — Z794 Long term (current) use of insulin: Secondary | ICD-10-CM | POA: Diagnosis not present

## 2020-02-23 DIAGNOSIS — E1122 Type 2 diabetes mellitus with diabetic chronic kidney disease: Secondary | ICD-10-CM | POA: Diagnosis not present

## 2020-02-23 DIAGNOSIS — I248 Other forms of acute ischemic heart disease: Secondary | ICD-10-CM | POA: Diagnosis not present

## 2020-02-23 DIAGNOSIS — N1832 Chronic kidney disease, stage 3b: Secondary | ICD-10-CM | POA: Diagnosis not present

## 2020-02-23 DIAGNOSIS — E1165 Type 2 diabetes mellitus with hyperglycemia: Secondary | ICD-10-CM | POA: Diagnosis not present

## 2020-02-24 DIAGNOSIS — E1122 Type 2 diabetes mellitus with diabetic chronic kidney disease: Secondary | ICD-10-CM | POA: Diagnosis not present

## 2020-02-24 DIAGNOSIS — I248 Other forms of acute ischemic heart disease: Secondary | ICD-10-CM | POA: Diagnosis not present

## 2020-02-24 DIAGNOSIS — F0390 Unspecified dementia without behavioral disturbance: Secondary | ICD-10-CM | POA: Diagnosis not present

## 2020-02-24 DIAGNOSIS — N1832 Chronic kidney disease, stage 3b: Secondary | ICD-10-CM | POA: Diagnosis not present

## 2020-02-24 DIAGNOSIS — I5031 Acute diastolic (congestive) heart failure: Secondary | ICD-10-CM | POA: Diagnosis not present

## 2020-02-24 DIAGNOSIS — I130 Hypertensive heart and chronic kidney disease with heart failure and stage 1 through stage 4 chronic kidney disease, or unspecified chronic kidney disease: Secondary | ICD-10-CM | POA: Diagnosis not present

## 2020-02-25 DIAGNOSIS — G2581 Restless legs syndrome: Secondary | ICD-10-CM | POA: Diagnosis not present

## 2020-02-25 DIAGNOSIS — Z6831 Body mass index (BMI) 31.0-31.9, adult: Secondary | ICD-10-CM | POA: Diagnosis not present

## 2020-02-25 DIAGNOSIS — Z79899 Other long term (current) drug therapy: Secondary | ICD-10-CM | POA: Diagnosis not present

## 2020-02-25 DIAGNOSIS — I509 Heart failure, unspecified: Secondary | ICD-10-CM | POA: Diagnosis not present

## 2020-02-25 LAB — BASIC METABOLIC PANEL
BUN: 40 — AB (ref 4–21)
CO2: 23 — AB (ref 13–22)
Chloride: 102 (ref 99–108)
Creatinine: 1.9 — AB (ref 0.5–1.1)
Glucose: 165
Potassium: 5.1 (ref 3.4–5.3)
Sodium: 140 (ref 137–147)

## 2020-02-25 LAB — COMPREHENSIVE METABOLIC PANEL
Calcium: 9.3 (ref 8.7–10.7)
GFR calc Af Amer: 26
GFR calc non Af Amer: 23

## 2020-02-26 ENCOUNTER — Other Ambulatory Visit: Payer: Self-pay

## 2020-02-26 NOTE — Progress Notes (Signed)
Cardiology Office Note:    Date:  02/27/2020   ID:  Vanessa Craig, DOB 05/26/1928, MRN [id number]  PCP:  Prochnau, Caroline, MD  Cardiologist:   , MD    Referring MD: Prochnau, Caroline, MD    ASSESSMENT:    1. Chronic diastolic heart failure due to valvular disease (HCC)   2. Sinus bradycardia   3. Hypertensive kidney disease with stage 3 chronic kidney disease, unspecified whether stage 3a or 3b CKD    PLAN:    In order of problems listed above:  1. Presently stable but at high risk for recurrent readmission to the hospital.  Her daughter is supervising and she will adjust her diuretic based on weight and unfortunately was not referred to the home heart failure program at discharge and will see if we can bring them into her care.  I will see back in the office in 6 weeks and awaiting labs from her primary care physician 2. Stable no bradycardia today 3. BP at target for age 86 hypertensives including beta-blocker low-dose and her diuretic   Next appointment: 6 weeks   Medication Adjustments/Labs and Tests Ordered: Current medicines are reviewed at length with the patient today.  Concerns regarding medicines are outlined above.  Orders Placed This Encounter  Procedures  . EKG 12-Lead   No orders of the defined types were placed in this encounter.   Chief Complaint  Patient presents with  . Follow-up  . Congestive Heart Failure    History of Present Illness:    Vanessa Craig is a 86 y.o. female with a hx of  hypertension with heart failure, CKD, T2 DM, and sinus bradycardia  last seen 08/30/2018.  Compliance with diet, lifestyle and medications: Yes  Her daughter is present, prior to admission the hospital her sodium was restricted and were not sure she was taking her diuretic on a regular basis.  Since she has been home her weight has been stable no edema shortness of breath chest pain palpitation or syncope.  She was seen in her PCP office renal  function was checked and I do not have a copy of those labs.  Her daughter will have her take extra diuretic in the day her weight is up 3 pounds and will see if we get [facility] to bring in the home heart failure program to avoid repeated admissions to the hospital.  Worried about kidney failure and I told him the records are reviewed at [facility] does not show me that she is for advanced and I do not think that the worry about renal failure in the near future.  Her daughter has her in her home supervising her diet.  She was present during the interview and decision making.  She was admitted to [facility] 02/19/2020 to 02/22/2020.  She presented with shortness of breath noted to be hypoxic oxygen saturation 84% initially marked hypertension systolic greater than 100 diastolics greater than 110 and was found to be in decompensated failure she had renal dysfunction with a creatinine of 1.8 and had a mild troponin elevation that was felt to be due to decompensated heart failure and accelerated hypertension.  CT angiogram of the chest done on admission showed findings of heart failure and diffuse coronary artery calcification.  proBNP level was normal on presentation 404 D-dimer is elevated at 661 mild elevation of troponin ranging from 0.13-0.20 chest x-ray prior to discharge was normal.  Echocardiogram showed normal left ventricular size wall thickness and systolic function.    Left atrium is moderately dilated.  Testing for COVID-19 was negative.  I independently reviewed her EKG on presentation which showed sinus rhythm and ST segment depression consistent with ischemia.she was not seen by cardiology during hospitalization.  Past Medical History:  Diagnosis Date  . Acute congestive heart failure with left ventricular diastolic dysfunction (HCC)   . Acute renal failure superimposed on stage 3b chronic kidney disease (HCC)   . Acute respiratory failure (HCC)   . Chest pain   . Chronic  kidney disease, stage III (moderate) 08/06/2013  . Dementia (HCC)   . Diabetes mellitus without complication (HCC)   . DM (diabetes mellitus) (HCC) 07/31/2013  . Esophageal reflux 03/08/2018  . Essential hypertension 08/24/2016  . Hypercholesterolemia   . Hypertension   . Hypertensive emergency   . Obesity 03/08/2018  . Other and unspecified hyperlipidemia 08/06/2013  . PUD (peptic ulcer disease) 03/08/2018  . Sinus bradycardia 03/08/2018  . Type 2 diabetes mellitus with hyperglycemia (HCC)   . Unspecified essential hypertension 08/04/2013    Past Surgical History:  Procedure Laterality Date  . APPENDECTOMY      Current Medications: Current Meds  Medication Sig  . Accu-Chek FastClix Lancets MISC Use as instructed to check blood sugar 4 times daily.  . amLODipine (NORVASC) 10 MG tablet Take 10 mg by mouth daily.  . aspirin 81 MG tablet Take 81 mg by mouth at bedtime.   . carvedilol (COREG) 6.25 MG tablet Take 0.5 tablets (3.125 mg total) by mouth 2 (two) times daily with a meal. Do not take the second if HR <55 BPM  . Cholecalciferol (VITAMIN D3) 3000 UNITS TABS Take 1 capsule by mouth at bedtime.   . cloNIDine (CATAPRES) 0.1 MG tablet Take 0.1 mg by mouth at bedtime.  . famotidine (PEPCID) 40 MG tablet Take 40 mg by mouth at bedtime.  . furosemide (LASIX) 20 MG tablet Take 20 mg by mouth. TAKE 1 TABLET BY MOUTH DAILY FOR ANKLE SWELLING.  . glucose blood (ONETOUCH VERIO) test strip Use to test blood sugars 3 times daily. DX:E11.65  . guanFACINE (TENEX) 2 MG tablet Take 2 mg by mouth at bedtime.   . insulin aspart protamine - aspart (NOVOLOG MIX 70/30 FLEXPEN) (70-30) 100 UNIT/ML FlexPen Inject 12 units under the skin at breakfast, 9 units at lunch, and 12 units at dinner.  . Insulin Pen Needle (B-D ULTRAFINE III SHORT PEN) 31G X 8 MM MISC USE AS INSTRUCTED  . Lancets Misc. (ACCU-CHEK FASTCLIX LANCET) KIT Use as instructed to check blood sugar 4 times daily.  . lansoprazole (PREVACID) 15 MG  capsule Take 15 mg by mouth every morning.  . meclizine (ANTIVERT) 12.5 MG tablet Take 12.5 mg by mouth daily as needed for dizziness.   . memantine (NAMENDA XR) 21 MG CP24 24 hr capsule Take 21 mg by mouth daily.  . Multiple Vitamin (MULTIVITAMIN WITH MINERALS) TABS tablet Take 1 tablet by mouth daily.  . Omega-3 Fatty Acids (FISH OIL) 1000 MG CAPS Take 1,000 mg by mouth daily.   . raloxifene (EVISTA) 60 MG tablet TAKE 1 TABLET BY MOUTH EVERY DAY  . ramipril (ALTACE) 10 MG capsule TAKE 1 CAPSULE (10 MG TOTAL) BY MOUTH 2 (TWO) TIMES DAILY. DO NOT TAKE SECOND-PM DOSE IF SYSTOLIC < 125  . repaglinide (PRANDIN) 2 MG tablet Take 2 mg by mouth daily.  . rOPINIRole (REQUIP) 0.25 MG tablet Take 0.25 mg by mouth at bedtime.  . rosuvastatin (CRESTOR) 10 MG tablet TAKE 1 TABLET   BY MOUTH EVERY DAY  . traZODone (DESYREL) 50 MG tablet Take 50 mg by mouth at bedtime as needed for sleep.      Allergies:   Codeine   Social History   Socioeconomic History  . Marital status: Unknown    Spouse name: Not on file  . Number of children: Not on file  . Years of education: Not on file  . Highest education level: Not on file  Occupational History  . Not on file  Tobacco Use  . Smoking status: Never Smoker  . Smokeless tobacco: Never Used  Substance and Sexual Activity  . Alcohol use: No  . Drug use: Never  . Sexual activity: Not on file  Other Topics Concern  . Not on file  Social History Narrative  . Not on file   Social Determinants of Health   Financial Resource Strain:   . Difficulty of Paying Living Expenses:   Food Insecurity:   . Worried About Running Out of Food in the Last Year:   . Ran Out of Food in the Last Year:   Transportation Needs:   . Lack of Transportation (Medical):   . Lack of Transportation (Non-Medical):   Physical Activity:   . Days of Exercise per Week:   . Minutes of Exercise per Session:   Stress:   . Feeling of Stress :   Social Connections:   . Frequency of  Communication with Friends and Family:   . Frequency of Social Gatherings with Friends and Family:   . Attends Religious Services:   . Active Member of Clubs or Organizations:   . Attends Club or Organization Meetings:   . Marital Status:      Family History: The patient's family history includes Heart attack in her father; Heart disease in her father; Hypertension in her mother. ROS:   Please see the history of present illness.    All other systems reviewed and are negative.  EKGs/Labs/Other Studies Reviewed:    The following studies were reviewed today:  Recent Labs: 09/01/2019: TSH 1.65 12/03/2019: ALT 10; BUN 31; Creatinine, Ser 1.78; Potassium 4.5; Sodium 140  Recent Lipid Panel    Component Value Date/Time   CHOL 130 09/01/2019 1119   TRIG 117.0 09/01/2019 1119   HDL 34.90 (L) 09/01/2019 1119   CHOLHDL 4 09/01/2019 1119   VLDL 23.4 09/01/2019 1119   LDLCALC 71 09/01/2019 1119   LDLDIRECT 83.0 08/23/2018 1013    Physical Exam:    VS:  BP (!) 148/62   Pulse 60   Ht 5' 4" (1.626 m)   Wt 185 lb 6.4 oz (84.1 kg)   SpO2 96%   BMI 31.82 kg/m     Wt Readings from Last 3 Encounters:  02/27/20 185 lb 6.4 oz (84.1 kg)  12/03/19 189 lb (85.7 kg)  09/01/19 191 lb (86.6 kg)     GEN: Elderly well nourished, well developed in no acute distress HEENT: Normal NECK: No JVD; No carotid bruits LYMPHATICS: No lymphadenopathy CARDIAC: RRR, no murmurs, rubs, gallops RESPIRATORY:  Clear to auscultation without rales, wheezing or rhonchi  ABDOMEN: Soft, non-tender, non-distended MUSCULOSKELETAL:  No edema; No deformity  SKIN: Warm and dry NEUROLOGIC:  Alert and oriented x 3 PSYCHIATRIC:  Normal affect    Signed,  , MD  02/27/2020 2:00 PM    [facility]  

## 2020-02-27 ENCOUNTER — Other Ambulatory Visit: Payer: Self-pay

## 2020-02-27 ENCOUNTER — Encounter: Payer: Self-pay | Admitting: Cardiology

## 2020-02-27 ENCOUNTER — Ambulatory Visit (INDEPENDENT_AMBULATORY_CARE_PROVIDER_SITE_OTHER): Payer: Medicare Other | Admitting: Cardiology

## 2020-02-27 VITALS — BP 148/62 | HR 60 | Ht 64.0 in | Wt 185.4 lb

## 2020-02-27 DIAGNOSIS — N183 Chronic kidney disease, stage 3 unspecified: Secondary | ICD-10-CM

## 2020-02-27 DIAGNOSIS — I5032 Chronic diastolic (congestive) heart failure: Secondary | ICD-10-CM | POA: Diagnosis not present

## 2020-02-27 DIAGNOSIS — I129 Hypertensive chronic kidney disease with stage 1 through stage 4 chronic kidney disease, or unspecified chronic kidney disease: Secondary | ICD-10-CM | POA: Diagnosis not present

## 2020-02-27 DIAGNOSIS — R001 Bradycardia, unspecified: Secondary | ICD-10-CM | POA: Diagnosis not present

## 2020-02-27 DIAGNOSIS — I38 Endocarditis, valve unspecified: Secondary | ICD-10-CM | POA: Diagnosis not present

## 2020-02-27 NOTE — Patient Instructions (Addendum)
Medication Instructions:  Your physician recommends that you continue on your current medications as directed. Please refer to the Current Medication list given to you today.  *If you need a refill on your cardiac medications before your next appointment, please call your pharmacy*   Lab Work: None If you have labs (blood work) drawn today and your tests are completely normal, you will receive your results only by: Marland Kitchen MyChart Message (if you have MyChart) OR . A paper copy in the mail If you have any lab test that is abnormal or we need to change your treatment, we will call you to review the results.   Testing/Procedures: None   Follow-Up: At [facility], you and your health needs are our priority.  As part of our continuing mission to provide you with exceptional heart care, we have created designated Provider Care Teams.  These Care Teams include your primary Cardiologist (physician) and Advanced Practice Providers (APPs -  Physician Assistants and Nurse Practitioners) who all work together to provide you with the care you need, when you need it.  We recommend signing up for the patient portal called "MyChart".  Sign up information is provided on this After Visit Summary.  MyChart is used to connect with patients for Virtual Visits (Telemedicine).  Patients are able to view lab/test results, encounter notes, upcoming appointments, etc.  Non-urgent messages can be sent to your provider as well.   To learn more about what you can do with MyChart, go to [email or web address].    Your next appointment:   6 week(s)  The format for your next appointment:   In Person  Provider:   Shirlee More, MD   Other Instructions Anytime your weight gets above 179 lbs or greater take one extra tablet of your furosemide in the early afternoon.   Please let your home health nurse know that you need to have the heart failure home health nurse come out to your home to see you.

## 2020-03-01 ENCOUNTER — Other Ambulatory Visit: Payer: Self-pay

## 2020-03-01 DIAGNOSIS — N1832 Chronic kidney disease, stage 3b: Secondary | ICD-10-CM | POA: Diagnosis not present

## 2020-03-01 DIAGNOSIS — F0390 Unspecified dementia without behavioral disturbance: Secondary | ICD-10-CM | POA: Diagnosis not present

## 2020-03-01 DIAGNOSIS — I5031 Acute diastolic (congestive) heart failure: Secondary | ICD-10-CM | POA: Diagnosis not present

## 2020-03-01 DIAGNOSIS — I248 Other forms of acute ischemic heart disease: Secondary | ICD-10-CM | POA: Diagnosis not present

## 2020-03-01 DIAGNOSIS — I130 Hypertensive heart and chronic kidney disease with heart failure and stage 1 through stage 4 chronic kidney disease, or unspecified chronic kidney disease: Secondary | ICD-10-CM | POA: Diagnosis not present

## 2020-03-01 DIAGNOSIS — E1122 Type 2 diabetes mellitus with diabetic chronic kidney disease: Secondary | ICD-10-CM | POA: Diagnosis not present

## 2020-03-01 MED ORDER — BD PEN NEEDLE SHORT U/F 31G X 8 MM MISC: 3 refills | Status: AC

## 2020-03-02 ENCOUNTER — Ambulatory Visit (INDEPENDENT_AMBULATORY_CARE_PROVIDER_SITE_OTHER): Payer: Medicare Other | Admitting: Endocrinology

## 2020-03-02 ENCOUNTER — Encounter: Payer: Self-pay | Admitting: Endocrinology

## 2020-03-02 VITALS — BP 130/60 | HR 60 | Ht 64.0 in | Wt 182.8 lb

## 2020-03-02 DIAGNOSIS — N1832 Chronic kidney disease, stage 3b: Secondary | ICD-10-CM | POA: Diagnosis not present

## 2020-03-02 DIAGNOSIS — E1165 Type 2 diabetes mellitus with hyperglycemia: Secondary | ICD-10-CM | POA: Diagnosis not present

## 2020-03-02 DIAGNOSIS — E041 Nontoxic single thyroid nodule: Secondary | ICD-10-CM

## 2020-03-02 DIAGNOSIS — E782 Mixed hyperlipidemia: Secondary | ICD-10-CM

## 2020-03-02 DIAGNOSIS — Z794 Long term (current) use of insulin: Secondary | ICD-10-CM

## 2020-03-02 LAB — LIPID PANEL
Cholesterol: 128 mg/dL (ref 0–200)
HDL: 37.7 mg/dL — ABNORMAL LOW (ref >39.00)
LDL Cholesterol: 53 mg/dL (ref 0–99)
NonHDL: 90.74
Total CHOL/HDL Ratio: 3
Triglycerides: 190 mg/dL — ABNORMAL HIGH (ref 0.0–149.0)
VLDL: 38 mg/dL (ref 0.0–40.0)

## 2020-03-02 LAB — COMPREHENSIVE METABOLIC PANEL
ALT: 11 U/L (ref 0–35)
AST: 14 U/L (ref 0–37)
Albumin: 3.9 g/dL (ref 3.5–5.2)
Alkaline Phosphatase: 65 U/L (ref 39–117)
BUN: 38 mg/dL — ABNORMAL HIGH (ref 6–23)
CO2: 29 mEq/L (ref 19–32)
Calcium: 9.6 mg/dL (ref 8.4–10.5)
Chloride: 105 mEq/L (ref 96–112)
Creatinine, Ser: 1.8 mg/dL — ABNORMAL HIGH (ref 0.40–1.20)
GFR: 26.42 mL/min — ABNORMAL LOW (ref >60.00)
Glucose, Bld: 146 mg/dL — ABNORMAL HIGH (ref 70–99)
Potassium: 5 mEq/L (ref 3.5–5.1)
Sodium: 139 mEq/L (ref 135–145)
Total Bilirubin: 0.6 mg/dL (ref 0.2–1.2)
Total Protein: 6.8 g/dL (ref 6.0–8.3)

## 2020-03-02 LAB — POCT GLYCOSYLATED HEMOGLOBIN (HGB A1C): Hemoglobin A1C: 8.9 % — AB (ref 4.0–5.6)

## 2020-03-02 LAB — MICROALBUMIN / CREATININE URINE RATIO
Creatinine,U: 65 mg/dL
Microalb Creat Ratio: 28 mg/g (ref 0.0–30.0)
Microalb, Ur: 18.2 mg/dL — ABNORMAL HIGH (ref 0.0–1.9)

## 2020-03-02 LAB — TSH: TSH: 2.65 u[IU]/mL (ref 0.35–4.50)

## 2020-03-02 LAB — GLUCOSE, POCT (MANUAL RESULT ENTRY): POC Glucose: 169 mg/dl — AB (ref 70–99)

## 2020-03-02 NOTE — Patient Instructions (Addendum)
Take 12 U at lunch also   If am sugar stays < 100 then go 8 on Tresiba  Check blood sugars on waking up days a week  Also check blood sugars about 2 hours after meals and do this after different meals by rotation  Recommended blood sugar levels on waking up are 90-130 and about 2 hours after meal is 130-180  Please bring your blood sugar monitor to each visit, thank you

## 2020-03-02 NOTE — Progress Notes (Signed)
Patient ID: Vanessa Craig, female   DOB: 07-31-1928, 86 y.o.   MRN: [id number]     Reason for Appointment: Diabetes follow-up   History of Present Illness   Diagnosis: Type 2 DIABETES MELITUS, date of diagnosis: 1995        She has had mild and usually well-controlled diabetes for several years Previously on metformin and this was stopped because of renal dysfunction Also has been tried on low dose basal insulin previously However with using GLP-1 drugs like Byetta and Victoza her blood sugars have been fairly well controlled She has been on Victoza since 2011   Because of significantly high readings including over 300 in October 2017 she was started on low-dose premixed insulin at suppertime  Recent history:  Non-insulin hypoglycemic drugs:.   Prandin 2 mg at lunch  INSULIN regimen: NovoLog Mix 14 before breakfast, 9 before lunch and 14 before dinner , TRESIBA 10 units acs  daily  Her A1c is slightly higher at 8.9    Current management, blood sugar patterns and problems identified:  She did not bring her meter today  Her daughter says that her blood sugars were generally higher and frequently over 200 but this was mostly before her hospitalization a couple of weeks ago  With her staying at her daughter's home and paying attention to her diet more consistently apparently blood sugars are better  Only at suppertime blood sugars are relatively higher and frequently over 200  However fasting readings appear to be relatively better  The only change made on her last visit in 1/21 was increasing her suppertime NovoLog mix by 2 units when her fasting readings were relatively high  Her daughter is now able to supervise her insulin and medications  No hypoglycemia reported  She is still eating some cereal in the morning with cheese or an egg now for protein  Not able to do much exercise or walking especially recently   Hypoglycemia: As above  Side effects  from medications: Nausea from Victoza, edema from Actos  GLUCOSE MONITORING FREQUENCY:  Currently about 2 times a day.    Glucometer: One Touch Verio       Blood Glucose readings by recall from the daughter   PRE-MEAL Fasting Lunch Dinner Bedtime Overall  Glucose range:  94-125   180-220 228   Mean/median:        Previous readings:  PRE-MEAL Fasting Lunch Dinner Bedtime Overall  Glucose range:  146-190   110-299    Mean/median:  171   206   192   POST-MEAL PC Breakfast PC Lunch PC Dinner  Glucose range:    147-327  Mean/median:    229     Meals: 3 meals per day. Dinner 6-7pm  Special K Cereal or raisin bran, rarely eggs, variable carbohydrate intake at other meals          Dietician visit: Most recent: Years ago          Wt Readings from Last 3 Encounters:  03/02/20 182 lb 12.8 oz (82.9 kg)  02/27/20 185 lb 6.4 oz (84.1 kg)  12/03/19 189 lb (85.7 kg)    Lab Results  Component Value Date   HGBA1C 8.9 (A) 03/02/2020   HGBA1C 8.5 (A) 12/03/2019   HGBA1C 7.7 (A) 09/01/2019   Lab Results  Component Value Date   MICROALBUR 8.4 (H) 01/18/2018   LDLCALC 71 09/01/2019   CREATININE 1.78 (H) 12/03/2019     OTHER active problems: See review of  systems   Office Visit on 03/02/2020  Component Date Value Ref Range Status  . POC Glucose 03/02/2020 169* 70 - 99 mg/dl Final  . Hemoglobin A1C 03/02/2020 8.9* 4.0 - 5.6 % Final    Allergies as of 03/02/2020      Reactions   Codeine Nausea And Vomiting      Medication List       Accurate as of March 02, 2020  1:03 PM. If you have any questions, ask your nurse or doctor.        Accu-Chek [employer] Kit Use as instructed to check blood sugar 4 times daily.   Accu-Chek [employer] Lancets Misc Use as instructed to check blood sugar 4 times daily.   amLODipine 10 MG tablet Commonly known as: NORVASC Take 10 mg by mouth daily.   aspirin 81 MG tablet Take 81 mg by mouth at bedtime.   B-D ULTRAFINE III SHORT PEN  31G X 8 MM Misc Generic drug: Insulin Pen Needle Use to inject insulin 4 times daily.   carvedilol 6.25 MG tablet Commonly known as: COREG Take 0.5 tablets (3.125 mg total) by mouth 2 (two) times daily with a meal. Do not take the second if HR <55 BPM   cloNIDine 0.1 MG tablet Commonly known as: CATAPRES Take 0.1 mg by mouth at bedtime.   famotidine 40 MG tablet Commonly known as: PEPCID Take 40 mg by mouth at bedtime.   Fish Oil 1000 MG Caps Take 1,000 mg by mouth daily.   furosemide 20 MG tablet Commonly known as: LASIX Take 20 mg by mouth. TAKE 1 TABLET BY MOUTH DAILY FOR ANKLE SWELLING.   guanFACINE 2 MG tablet Commonly known as: TENEX Take 2 mg by mouth at bedtime.   lansoprazole 15 MG capsule Commonly known as: PREVACID Take 15 mg by mouth every morning.   meclizine 12.5 MG tablet Commonly known as: ANTIVERT Take 12.5 mg by mouth daily as needed for dizziness.   memantine 21 MG Cp24 24 hr capsule Commonly known as: NAMENDA XR Take 21 mg by mouth daily.   multivitamin with minerals Tabs tablet Take 1 tablet by mouth daily.   NovoLOG Mix 70/30 FlexPen (70-30) 100 UNIT/ML FlexPen Generic drug: insulin aspart protamine - aspart Inject into the skin in the morning, at noon, and at bedtime. Inject 12 units under the skin before breakfast, 9 units at lunch, and 14 units at dinner. What changed: Another medication with the same name was removed. Continue taking this medication, and follow the directions you see here. Changed by: Elayne Snare, MD   OneTouch Verio test strip Generic drug: glucose blood Use to test blood sugars 3 times daily. DX:E11.65   raloxifene 60 MG tablet Commonly known as: EVISTA TAKE 1 TABLET BY MOUTH EVERY DAY   ramipril 10 MG capsule Commonly known as: ALTACE TAKE 1 CAPSULE (10 MG TOTAL) BY MOUTH 2 (TWO) TIMES DAILY. DO NOT TAKE SECOND-PM DOSE IF SYSTOLIC < 096   repaglinide 2 MG tablet Commonly known as: PRANDIN Take 2 mg by mouth  daily.   rOPINIRole 0.25 MG tablet Commonly known as: REQUIP Take 0.25 mg by mouth at bedtime.   rosuvastatin 10 MG tablet Commonly known as: CRESTOR TAKE 1 TABLET BY MOUTH EVERY DAY   traZODone 50 MG tablet Commonly known as: DESYREL Take 50 mg by mouth at bedtime as needed for sleep.   Vitamin D3 75 MCG (3000 UT) Tabs Take 1 capsule by mouth at bedtime.  Allergies:  Allergies  Allergen Reactions  . Codeine Nausea And Vomiting    Past Medical History:  Diagnosis Date  . Acute congestive heart failure with left ventricular diastolic dysfunction ([place])   . Acute renal failure superimposed on stage 3b chronic kidney disease ([place])   . Acute respiratory failure ([place])   . Chest pain   . Chronic kidney disease, stage III (moderate) 08/06/2013  . Dementia ([place])   . Diabetes mellitus without complication ([place])   . DM (diabetes mellitus) ([place]) 07/31/2013  . Esophageal reflux 03/08/2018  . Essential hypertension 08/24/2016  . Hypercholesterolemia   . Hypertension   . Hypertensive emergency   . Obesity 03/08/2018  . Other and unspecified hyperlipidemia 08/06/2013  . PUD (peptic ulcer disease) 03/08/2018  . Sinus bradycardia 03/08/2018  . Type 2 diabetes mellitus with hyperglycemia ([place])   . Unspecified essential hypertension 08/04/2013    Past Surgical History:  Procedure Laterality Date  . APPENDECTOMY      Family History  Problem Relation Age of Onset  . Hypertension Mother   . Heart disease Father   . Heart attack Father     Social History:  reports that she has never smoked. She has never used smokeless tobacco. She reports that she does not drink alcohol or use drugs.  Review of Systems:  THYROID NODULE:  This was incidentally discovered ULTRASOUND of thyroid was done by her PCP and showed a 3.4 cm mostly solid isoechoic nodule, this was done on 05/29/2018    Osteopenia: She is on Evista from her PCP   HYPERTENSION:  she has had long-standing hypertension  treated with multiple drugs including 10 mg ramipril Followed by PCP   BP Readings from Last 3 Encounters:  03/02/20 130/60  02/27/20 (!) 148/62  12/03/19 (!) 142/70   EDEMA: She has had less edema recently, continues on 20 mg Lasix  Also Actos was stopped   Renal dysfunction: She has had persistent renal dysfunction but this has been  stable for several years  Lab Results  Component Value Date   CREATININE 1.78 (H) 12/03/2019   CREATININE 1.86 (H) 09/01/2019   CREATININE 1.58 (H) 08/23/2018     HYPERLIPIDEMIA: The lipid abnormality consists of elevated LDL and high triglycerides  She is taking Crestor 10 mg and fenofibrate 160 mg daily Labs as follows   Lab Results  Component Value Date   CHOL 130 09/01/2019   HDL 34.90 (L) 09/01/2019   LDLCALC 71 09/01/2019   LDLDIRECT 83.0 08/23/2018   TRIG 117.0 09/01/2019   CHOLHDL 4 09/01/2019     Examination:   BP 130/60 (BP Location: Left Arm, Patient Position: Sitting, Cuff Size: Normal)   Pulse 60   Ht '5\' 4"'$  (1.626 m)   Wt 182 lb 12.8 oz (82.9 kg)   SpO2 96%   BMI 31.38 kg/m   Body mass index is 31.38 kg/m.   No edema present  No mass felt in the thyroid   ASSESSMENT/ PLAN:   Diabetes type 2 long-standing  See history of present illness for detailed discussion of  current management, blood sugar patterns and problems identified  Her A1c has increased to 8.9 Although this may be adequate for her age she is now insulin requiring especially after stopping Actos Unlikely to be benefiting much from her use of Prandin Currently with her staying at her daughter's home the compliance with diet and insulin regimen will likely be better Also reportedly blood sugars are improving although unable to verify  her blood sugar records since she did not bring her meter Apparently most of her high readings are after lunch and before dinner Her diet recently appears to be fairly good and likely more balanced with being  supervised  No hypoglycemia  Recommendations:  Increase lunchtime dose to 12 units of the NovoLog  Continue other doses unchanged  If morning sugars start getting below 100 consistently she can reduce Tresiba to 8 units and possibly stop  For now we will continue Prandin  Recommended checking blood sugars after breakfast and dinner more consistently and not every day in the morning   LIPIDS: Previously well controlled  RENAL dysfunction: This apparently was worse recently possibly from overdiuresis This will be rechecked today along with microalbumin if she is able to give [place] a urine specimen  HYPERTENSION: On the multiple drugs including ramipril, followed by cardiologist and PCP  History of thyroid nodule on ultrasound in 2019: This is not clinically palpable again and discussed with the daughter that considering her age it is unlikely that we would need to consider biopsy and possible surgery given the usually indolent nature of thyroid cancer and very low likelihood of this being present   Patient Instructions  Take 12 U at lunch also   If am sugar stays < 100 then go 8 on Tresiba  Check blood sugars on waking up days a week  Also check blood sugars about 2 hours after meals and do this after different meals by rotation  Recommended blood sugar levels on waking up are 90-130 and about 2 hours after meal is 130-180  Please bring your blood sugar monitor to each visit, thank you      Elayne Snare 03/02/2020, 1:03 PM

## 2020-03-04 DIAGNOSIS — I5031 Acute diastolic (congestive) heart failure: Secondary | ICD-10-CM | POA: Diagnosis not present

## 2020-03-04 DIAGNOSIS — I130 Hypertensive heart and chronic kidney disease with heart failure and stage 1 through stage 4 chronic kidney disease, or unspecified chronic kidney disease: Secondary | ICD-10-CM | POA: Diagnosis not present

## 2020-03-04 DIAGNOSIS — I248 Other forms of acute ischemic heart disease: Secondary | ICD-10-CM | POA: Diagnosis not present

## 2020-03-04 DIAGNOSIS — F0390 Unspecified dementia without behavioral disturbance: Secondary | ICD-10-CM | POA: Diagnosis not present

## 2020-03-04 DIAGNOSIS — N1832 Chronic kidney disease, stage 3b: Secondary | ICD-10-CM | POA: Diagnosis not present

## 2020-03-04 DIAGNOSIS — E1122 Type 2 diabetes mellitus with diabetic chronic kidney disease: Secondary | ICD-10-CM | POA: Diagnosis not present

## 2020-03-08 DIAGNOSIS — N1832 Chronic kidney disease, stage 3b: Secondary | ICD-10-CM | POA: Diagnosis not present

## 2020-03-08 DIAGNOSIS — F0390 Unspecified dementia without behavioral disturbance: Secondary | ICD-10-CM | POA: Diagnosis not present

## 2020-03-08 DIAGNOSIS — I5031 Acute diastolic (congestive) heart failure: Secondary | ICD-10-CM | POA: Diagnosis not present

## 2020-03-08 DIAGNOSIS — I130 Hypertensive heart and chronic kidney disease with heart failure and stage 1 through stage 4 chronic kidney disease, or unspecified chronic kidney disease: Secondary | ICD-10-CM | POA: Diagnosis not present

## 2020-03-08 DIAGNOSIS — I248 Other forms of acute ischemic heart disease: Secondary | ICD-10-CM | POA: Diagnosis not present

## 2020-03-08 DIAGNOSIS — E1122 Type 2 diabetes mellitus with diabetic chronic kidney disease: Secondary | ICD-10-CM | POA: Diagnosis not present

## 2020-03-09 NOTE — Progress Notes (Signed)
Please call her daughter, kidney test is slightly better with creatinine 1.8, has been faxed to PCP

## 2020-03-10 DIAGNOSIS — I130 Hypertensive heart and chronic kidney disease with heart failure and stage 1 through stage 4 chronic kidney disease, or unspecified chronic kidney disease: Secondary | ICD-10-CM | POA: Diagnosis not present

## 2020-03-10 DIAGNOSIS — I248 Other forms of acute ischemic heart disease: Secondary | ICD-10-CM | POA: Diagnosis not present

## 2020-03-10 DIAGNOSIS — I5031 Acute diastolic (congestive) heart failure: Secondary | ICD-10-CM | POA: Diagnosis not present

## 2020-03-10 DIAGNOSIS — N1832 Chronic kidney disease, stage 3b: Secondary | ICD-10-CM | POA: Diagnosis not present

## 2020-03-10 DIAGNOSIS — F0390 Unspecified dementia without behavioral disturbance: Secondary | ICD-10-CM | POA: Diagnosis not present

## 2020-03-10 DIAGNOSIS — E1122 Type 2 diabetes mellitus with diabetic chronic kidney disease: Secondary | ICD-10-CM | POA: Diagnosis not present

## 2020-03-11 DIAGNOSIS — I248 Other forms of acute ischemic heart disease: Secondary | ICD-10-CM | POA: Diagnosis not present

## 2020-03-11 DIAGNOSIS — I130 Hypertensive heart and chronic kidney disease with heart failure and stage 1 through stage 4 chronic kidney disease, or unspecified chronic kidney disease: Secondary | ICD-10-CM | POA: Diagnosis not present

## 2020-03-11 DIAGNOSIS — F0390 Unspecified dementia without behavioral disturbance: Secondary | ICD-10-CM | POA: Diagnosis not present

## 2020-03-11 DIAGNOSIS — N1832 Chronic kidney disease, stage 3b: Secondary | ICD-10-CM | POA: Diagnosis not present

## 2020-03-11 DIAGNOSIS — I5031 Acute diastolic (congestive) heart failure: Secondary | ICD-10-CM | POA: Diagnosis not present

## 2020-03-11 DIAGNOSIS — E1122 Type 2 diabetes mellitus with diabetic chronic kidney disease: Secondary | ICD-10-CM | POA: Diagnosis not present

## 2020-03-16 DIAGNOSIS — I5031 Acute diastolic (congestive) heart failure: Secondary | ICD-10-CM | POA: Diagnosis not present

## 2020-03-16 DIAGNOSIS — F0390 Unspecified dementia without behavioral disturbance: Secondary | ICD-10-CM | POA: Diagnosis not present

## 2020-03-16 DIAGNOSIS — E1122 Type 2 diabetes mellitus with diabetic chronic kidney disease: Secondary | ICD-10-CM | POA: Diagnosis not present

## 2020-03-16 DIAGNOSIS — I248 Other forms of acute ischemic heart disease: Secondary | ICD-10-CM | POA: Diagnosis not present

## 2020-03-16 DIAGNOSIS — N1832 Chronic kidney disease, stage 3b: Secondary | ICD-10-CM | POA: Diagnosis not present

## 2020-03-16 DIAGNOSIS — I130 Hypertensive heart and chronic kidney disease with heart failure and stage 1 through stage 4 chronic kidney disease, or unspecified chronic kidney disease: Secondary | ICD-10-CM | POA: Diagnosis not present

## 2020-03-22 DIAGNOSIS — I5031 Acute diastolic (congestive) heart failure: Secondary | ICD-10-CM | POA: Diagnosis not present

## 2020-03-22 DIAGNOSIS — I130 Hypertensive heart and chronic kidney disease with heart failure and stage 1 through stage 4 chronic kidney disease, or unspecified chronic kidney disease: Secondary | ICD-10-CM | POA: Diagnosis not present

## 2020-03-22 DIAGNOSIS — I248 Other forms of acute ischemic heart disease: Secondary | ICD-10-CM | POA: Diagnosis not present

## 2020-03-22 DIAGNOSIS — E1122 Type 2 diabetes mellitus with diabetic chronic kidney disease: Secondary | ICD-10-CM | POA: Diagnosis not present

## 2020-03-22 DIAGNOSIS — N1832 Chronic kidney disease, stage 3b: Secondary | ICD-10-CM | POA: Diagnosis not present

## 2020-03-22 DIAGNOSIS — F0390 Unspecified dementia without behavioral disturbance: Secondary | ICD-10-CM | POA: Diagnosis not present

## 2020-03-24 DIAGNOSIS — I5031 Acute diastolic (congestive) heart failure: Secondary | ICD-10-CM | POA: Diagnosis not present

## 2020-03-24 DIAGNOSIS — E7800 Pure hypercholesterolemia, unspecified: Secondary | ICD-10-CM | POA: Diagnosis not present

## 2020-03-24 DIAGNOSIS — Z794 Long term (current) use of insulin: Secondary | ICD-10-CM | POA: Diagnosis not present

## 2020-03-24 DIAGNOSIS — I248 Other forms of acute ischemic heart disease: Secondary | ICD-10-CM | POA: Diagnosis not present

## 2020-03-24 DIAGNOSIS — Z602 Problems related to living alone: Secondary | ICD-10-CM | POA: Diagnosis not present

## 2020-03-24 DIAGNOSIS — M1990 Unspecified osteoarthritis, unspecified site: Secondary | ICD-10-CM | POA: Diagnosis not present

## 2020-03-24 DIAGNOSIS — E1165 Type 2 diabetes mellitus with hyperglycemia: Secondary | ICD-10-CM | POA: Diagnosis not present

## 2020-03-24 DIAGNOSIS — N1832 Chronic kidney disease, stage 3b: Secondary | ICD-10-CM | POA: Diagnosis not present

## 2020-03-24 DIAGNOSIS — Z79899 Other long term (current) drug therapy: Secondary | ICD-10-CM | POA: Diagnosis not present

## 2020-03-24 DIAGNOSIS — I130 Hypertensive heart and chronic kidney disease with heart failure and stage 1 through stage 4 chronic kidney disease, or unspecified chronic kidney disease: Secondary | ICD-10-CM | POA: Diagnosis not present

## 2020-03-24 DIAGNOSIS — I161 Hypertensive emergency: Secondary | ICD-10-CM | POA: Diagnosis not present

## 2020-03-24 DIAGNOSIS — F0390 Unspecified dementia without behavioral disturbance: Secondary | ICD-10-CM | POA: Diagnosis not present

## 2020-03-24 DIAGNOSIS — E1122 Type 2 diabetes mellitus with diabetic chronic kidney disease: Secondary | ICD-10-CM | POA: Diagnosis not present

## 2020-03-24 DIAGNOSIS — R918 Other nonspecific abnormal finding of lung field: Secondary | ICD-10-CM | POA: Diagnosis not present

## 2020-03-24 DIAGNOSIS — Z79810 Long term (current) use of selective estrogen receptor modulators (SERMs): Secondary | ICD-10-CM | POA: Diagnosis not present

## 2020-03-24 DIAGNOSIS — Z7982 Long term (current) use of aspirin: Secondary | ICD-10-CM | POA: Diagnosis not present

## 2020-03-24 DIAGNOSIS — R5381 Other malaise: Secondary | ICD-10-CM | POA: Diagnosis not present

## 2020-03-25 DIAGNOSIS — E1122 Type 2 diabetes mellitus with diabetic chronic kidney disease: Secondary | ICD-10-CM | POA: Diagnosis not present

## 2020-03-26 DIAGNOSIS — E1122 Type 2 diabetes mellitus with diabetic chronic kidney disease: Secondary | ICD-10-CM | POA: Diagnosis not present

## 2020-03-26 DIAGNOSIS — N1832 Chronic kidney disease, stage 3b: Secondary | ICD-10-CM | POA: Diagnosis not present

## 2020-03-26 DIAGNOSIS — I5031 Acute diastolic (congestive) heart failure: Secondary | ICD-10-CM | POA: Diagnosis not present

## 2020-03-26 DIAGNOSIS — I248 Other forms of acute ischemic heart disease: Secondary | ICD-10-CM | POA: Diagnosis not present

## 2020-03-26 DIAGNOSIS — I130 Hypertensive heart and chronic kidney disease with heart failure and stage 1 through stage 4 chronic kidney disease, or unspecified chronic kidney disease: Secondary | ICD-10-CM | POA: Diagnosis not present

## 2020-03-26 DIAGNOSIS — F0390 Unspecified dementia without behavioral disturbance: Secondary | ICD-10-CM | POA: Diagnosis not present

## 2020-03-29 DIAGNOSIS — E1122 Type 2 diabetes mellitus with diabetic chronic kidney disease: Secondary | ICD-10-CM | POA: Diagnosis not present

## 2020-03-29 DIAGNOSIS — F0390 Unspecified dementia without behavioral disturbance: Secondary | ICD-10-CM | POA: Diagnosis not present

## 2020-03-29 DIAGNOSIS — I248 Other forms of acute ischemic heart disease: Secondary | ICD-10-CM | POA: Diagnosis not present

## 2020-03-29 DIAGNOSIS — I5031 Acute diastolic (congestive) heart failure: Secondary | ICD-10-CM | POA: Diagnosis not present

## 2020-03-29 DIAGNOSIS — I130 Hypertensive heart and chronic kidney disease with heart failure and stage 1 through stage 4 chronic kidney disease, or unspecified chronic kidney disease: Secondary | ICD-10-CM | POA: Diagnosis not present

## 2020-03-29 DIAGNOSIS — N1832 Chronic kidney disease, stage 3b: Secondary | ICD-10-CM | POA: Diagnosis not present

## 2020-04-01 DIAGNOSIS — E1122 Type 2 diabetes mellitus with diabetic chronic kidney disease: Secondary | ICD-10-CM | POA: Diagnosis not present

## 2020-04-01 DIAGNOSIS — I5031 Acute diastolic (congestive) heart failure: Secondary | ICD-10-CM | POA: Diagnosis not present

## 2020-04-01 DIAGNOSIS — N1832 Chronic kidney disease, stage 3b: Secondary | ICD-10-CM | POA: Diagnosis not present

## 2020-04-01 DIAGNOSIS — I130 Hypertensive heart and chronic kidney disease with heart failure and stage 1 through stage 4 chronic kidney disease, or unspecified chronic kidney disease: Secondary | ICD-10-CM | POA: Diagnosis not present

## 2020-04-01 DIAGNOSIS — I248 Other forms of acute ischemic heart disease: Secondary | ICD-10-CM | POA: Diagnosis not present

## 2020-04-01 DIAGNOSIS — F0390 Unspecified dementia without behavioral disturbance: Secondary | ICD-10-CM | POA: Diagnosis not present

## 2020-04-02 ENCOUNTER — Telehealth: Payer: Self-pay | Admitting: Cardiology

## 2020-04-02 NOTE — Telephone Encounter (Signed)
Left a detailed message on patients voicemail letting her know Dr. Joya Gaskins recommendation and also asking that she  please return our call.

## 2020-04-02 NOTE — Telephone Encounter (Signed)
Spoke to patients daughter regarding Dr. Joya Gaskins recommendation. No other issues or concerns were noted and she verbalizes understanding.    Encouraged patient to call back with any questions or concerns.

## 2020-04-02 NOTE — Telephone Encounter (Signed)
Pt c/o of Chest Pain: STAT if CP now or developed within 24 hours  1. Are you having CP right now? unsure  2. Are you experiencing any other symptoms (ex. SOB, nausea, vomiting, sweating)? headaches  3. How long have you been experiencing CP? Vanessa Craig states it started about a couple weeks ago  4. Is your CP continuous or coming and going? Coming and going  5. Have you taken Nitroglycerin? No   [name], the patients Physical Therapist Assistant states that she went and saw the patient yesterday. She states that with minimal activity (walking) the patient began complaining of a headache. She states this is normal and she was not that concerned with it until the patient started complaining of chest discomfort. She states that they went inside and sat down. [name] checked the patients BP, 170/88. Patient's daughter gave her clonidine and her BP did go down and the chest discomfort went away. Vanessa Craig states that the daughter stated her mother has had 3 little episodes like this that has happened since she was discharged from the hospital. But, Vanessa Craig states that she normally does a lot more activity with the patient and she has never heard her complain of chest discomfort. She was a little concerned with it because of the minimal activity they were doing. She states you can contact her at the provided number but it would be best to contact the patient. Unsure if the patient needs to be seen sooner than 04/13/20. Phone number for patient is [phone] ?

## 2020-04-02 NOTE — Telephone Encounter (Signed)
From the perspective of heart failure they will probably a good idea to get in next week and see her primary care physician in the office

## 2020-04-07 ENCOUNTER — Telehealth: Payer: Self-pay | Admitting: Cardiology

## 2020-04-07 DIAGNOSIS — N1832 Chronic kidney disease, stage 3b: Secondary | ICD-10-CM | POA: Diagnosis not present

## 2020-04-07 DIAGNOSIS — F0390 Unspecified dementia without behavioral disturbance: Secondary | ICD-10-CM | POA: Diagnosis not present

## 2020-04-07 DIAGNOSIS — I130 Hypertensive heart and chronic kidney disease with heart failure and stage 1 through stage 4 chronic kidney disease, or unspecified chronic kidney disease: Secondary | ICD-10-CM | POA: Diagnosis not present

## 2020-04-07 DIAGNOSIS — I248 Other forms of acute ischemic heart disease: Secondary | ICD-10-CM | POA: Diagnosis not present

## 2020-04-07 DIAGNOSIS — E1122 Type 2 diabetes mellitus with diabetic chronic kidney disease: Secondary | ICD-10-CM | POA: Diagnosis not present

## 2020-04-07 DIAGNOSIS — I5031 Acute diastolic (congestive) heart failure: Secondary | ICD-10-CM | POA: Diagnosis not present

## 2020-04-07 NOTE — Telephone Encounter (Signed)
Pt c/o BP issue: STAT if pt c/o blurred vision, one-sided weakness or slurred speech  1. What are your last 5 BP readings? 168/70 HR 48  2. Are you having any other symptoms (ex. Dizziness, headache, blurred vision, passed out)? SOB, denies all other symptoms  3. What is your BP issue? Elevated BP, patient took some Clonidine and daughter is to recheck BP.   Pt c/o Shortness Of Breath: STAT if SOB developed within the last 24 hours or pt is noticeably SOB on the phone  1. Are you currently SOB (can you hear that pt is SOB on the phone)? No   2. How long have you been experiencing SOB? 3 spells in the last couple weeks  3. Are you SOB when sitting or when up moving around? Up moving around  4. Are you currently experiencing any other symptoms?  Elevated BP, denies all other symptoms.   Baker Janus, patients home health nurse would like for you to reach out to the daughter, Santiago Glad.

## 2020-04-07 NOTE — Telephone Encounter (Signed)
Spoke to patients daughter Santiago Glad. I requested that she re-take the patients BP after taking the clonidine. The daughter states that the patients BP at this time is 150/71 and her HR is 48.  She states that the only other concern they have at this time is the patients SOB.  I spoke with Dr Harriet Masson in regards to this and she recommended that we just continue to monitor the patient and let us know if the SOB worsens or if the HR gets any lower. I also advised the patient to keep a log of her BP's daily and bring these into the office for her next office visit.

## 2020-04-08 DIAGNOSIS — E1122 Type 2 diabetes mellitus with diabetic chronic kidney disease: Secondary | ICD-10-CM | POA: Diagnosis not present

## 2020-04-08 DIAGNOSIS — I5031 Acute diastolic (congestive) heart failure: Secondary | ICD-10-CM | POA: Diagnosis not present

## 2020-04-08 DIAGNOSIS — N1832 Chronic kidney disease, stage 3b: Secondary | ICD-10-CM | POA: Diagnosis not present

## 2020-04-08 DIAGNOSIS — F0390 Unspecified dementia without behavioral disturbance: Secondary | ICD-10-CM | POA: Diagnosis not present

## 2020-04-08 DIAGNOSIS — I130 Hypertensive heart and chronic kidney disease with heart failure and stage 1 through stage 4 chronic kidney disease, or unspecified chronic kidney disease: Secondary | ICD-10-CM | POA: Diagnosis not present

## 2020-04-08 DIAGNOSIS — I248 Other forms of acute ischemic heart disease: Secondary | ICD-10-CM | POA: Diagnosis not present

## 2020-04-13 ENCOUNTER — Encounter: Payer: Self-pay | Admitting: Cardiology

## 2020-04-13 ENCOUNTER — Ambulatory Visit (INDEPENDENT_AMBULATORY_CARE_PROVIDER_SITE_OTHER): Payer: Medicare Other | Admitting: Cardiology

## 2020-04-13 ENCOUNTER — Other Ambulatory Visit: Payer: Self-pay

## 2020-04-13 VITALS — BP 140/60 | HR 56 | Ht 64.0 in | Wt 185.0 lb

## 2020-04-13 DIAGNOSIS — R001 Bradycardia, unspecified: Secondary | ICD-10-CM | POA: Diagnosis not present

## 2020-04-13 DIAGNOSIS — I38 Endocarditis, valve unspecified: Secondary | ICD-10-CM

## 2020-04-13 DIAGNOSIS — I129 Hypertensive chronic kidney disease with stage 1 through stage 4 chronic kidney disease, or unspecified chronic kidney disease: Secondary | ICD-10-CM

## 2020-04-13 DIAGNOSIS — I5032 Chronic diastolic (congestive) heart failure: Secondary | ICD-10-CM | POA: Diagnosis not present

## 2020-04-13 DIAGNOSIS — N183 Chronic kidney disease, stage 3 unspecified: Secondary | ICD-10-CM | POA: Diagnosis not present

## 2020-04-13 MED ORDER — NITROGLYCERIN 0.4 MG SL SUBL: 0.4000 mg | SUBLINGUAL_TABLET | SUBLINGUAL | 3 refills | Status: AC | PRN

## 2020-04-13 NOTE — Patient Instructions (Signed)
Medication Instructions:  Your physician has recommended you make the following change in your medication:  Start: Nitroglycerin 0.4 mg take one tablet by mouth every 5 minutes x3 as needed for chest pain and shortness of breath. *If you need a refill on your cardiac medications before your next appointment, please call your pharmacy   Lab Work: Your physician recommends that you return for lab work in: TODAY BMP, ProBNP If you have labs (blood work) drawn today and your tests are completely normal, you will receive your results only by: Marland Kitchen MyChart Message (if you have MyChart) OR . A paper copy in the mail If you have any lab test that is abnormal or we need to change your treatment, we will call you to review the results.   Testing/Procedures: None   Follow-Up: At [facility], you and your health needs are our priority.  As part of our continuing mission to provide you with exceptional heart care, we have created designated Provider Care Teams.  These Care Teams include your primary Cardiologist (physician) and Advanced Practice Providers (APPs -  Physician Assistants and Nurse Practitioners) who all work together to provide you with the care you need, when you need it.  We recommend signing up for the patient portal called "MyChart".  Sign up information is provided on this After Visit Summary.  MyChart is used to connect with patients for Virtual Visits (Telemedicine).  Patients are able to view lab/test results, encounter notes, upcoming appointments, etc.  Non-urgent messages can be sent to your provider as well.   To learn more about what you can do with MyChart, go to [email or web address].    Your next appointment:   3 month(s)  The format for your next appointment:   In Person  Provider:   Shirlee More, MD   Other Instructions

## 2020-04-13 NOTE — Progress Notes (Signed)
Cardiology Office Note:    Date:  04/13/2020   ID:  Vanessa Craig, DOB 1928-02-23, MRN [id number]  PCP:  Ernestene Kiel, MD  Cardiologist:  Shirlee More, MD    Referring MD: Ernestene Kiel, MD    ASSESSMENT:    1. Chronic diastolic heart failure due to valvular disease ([place])   2. Hypertensive kidney disease with stage 3 chronic kidney disease, unspecified whether stage 3a or 3b CKD   3. Sinus bradycardia    PLAN:    In order of problems listed above:  1. Stable I am pleased that she is done well post hospitalization I think she has a very narrow window of wellness and needs a supervision of her daughter and continue her current intensive treatment for heart failure and hypertension.  I think she is a poor candidate for elective coronary revascularization I gave her prescription for nitroglycerin she can use for anginal discomfort or severe shortness of breath.  She is at risk for renal dysfunction worsening hyperkalemia and recheck her electrolytes today.  Her BP is at target and bradycardia is stable on a minimal dose of beta-blocker.   Next appointment: 3 mos    Medication Adjustments/Labs and Tests Ordered: Current medicines are reviewed at length with the patient today.  Concerns regarding medicines are outlined above.  No orders of the defined types were placed in this encounter.  No orders of the defined types were placed in this encounter.   Chief Complaint  Patient presents with  . Follow-up  . Congestive Heart Failure  . Leg Swelling    History of Present Illness:    Vanessa Craig is a 86 y.o. female with a hx of hypertension with heart failure, CKD, T2 DM, and sinus bradycardia  last seen 02/27/2020.She was admitted to [facility] 02/19/2020 to 02/22/2020.  She presented with shortness of breath noted to be hypoxic oxygen saturation 84% initially marked hypertension systolic greater than 086 diastolics greater than 761 and was found to be in  decompensated failure she had renal dysfunction with a creatinine of 1.8 and had a mild troponin elevation that was felt to be due to decompensated heart failure and accelerated hypertension.  CT angiogram of the chest done on admission showed findings of heart failure and diffuse coronary artery calcification.  proBNP level was normal on presentation 404 D-dimer is elevated at 661 mild elevation of troponin ranging from 0.13-0.20 chest x-ray prior to discharge was normal.  Echocardiogram showed normal left ventricular size wall thickness and systolic function.  Left atrium is moderately dilated.  Testing for COVID-19 was negative.  I independently reviewed her EKG on presentation which showed sinus rhythm and ST segment depression consistent with ischemia.she was not seen by cardiology during hospitalization.   Compliance with diet, lifestyle and medications: Yes  In general she is doing well her weight has been stable no edema she is short of breath when physical therapy comes in a couple of occasions has been so severe she needed stop and rest and medication she had chest tightness.  I gave the daughter a prescription for nitroglycerin to use.  Intermittently she is clonidine for paroxysmal hypertension.  Heart rate at home typically runs greater than 50 and blood pressure is usually in range.  Overall she is steadily improving and her daughter is pleased with her progress. Past Medical History:  Diagnosis Date  . Acute congestive heart failure with left ventricular diastolic dysfunction ([place])   . Acute renal failure superimposed on stage 3b  chronic kidney disease ([place])   . Acute respiratory failure ([place])   . Chest pain   . Chronic kidney disease, stage III (moderate) 08/06/2013  . Dementia ([place])   . Diabetes mellitus without complication ([place])   . Diabetic polyneuropathy associated with type 2 diabetes mellitus ([place]) 04/25/2018  . DM (diabetes mellitus) ([place]) 07/31/2013  . Esophageal reflux 03/08/2018   . Essential hypertension 08/24/2016  . Hammer toe of left foot 04/25/2018  . Hypercholesterolemia   . Hypertension   . Hypertensive chronic kidney disease 08/04/2013  . Hypertensive CKD (chronic kidney disease) 08/24/2016  . Hypertensive emergency   . Obesity 03/08/2018  . Onychomycosis with ingrown toenail 04/25/2018  . Other and unspecified hyperlipidemia 08/06/2013  . PUD (peptic ulcer disease) 03/08/2018  . Sinus bradycardia 03/08/2018  . Type 2 diabetes mellitus with hyperglycemia ([place])   . Unspecified essential hypertension 08/04/2013    Past Surgical History:  Procedure Laterality Date  . APPENDECTOMY      Current Medications: Current Meds  Medication Sig  . Accu-Chek FastClix Lancets MISC Use as instructed to check blood sugar 4 times daily.  Marland Kitchen amLODipine (NORVASC) 10 MG tablet Take 10 mg by mouth daily.  Marland Kitchen aspirin 81 MG tablet Take 81 mg by mouth at bedtime.   . carvedilol (COREG) 6.25 MG tablet Take 0.5 tablets (3.125 mg total) by mouth 2 (two) times daily with a meal. Do not take the second if HR <55 BPM  . Cholecalciferol (VITAMIN D3) 3000 UNITS TABS Take 1 capsule by mouth at bedtime.   . cloNIDine (CATAPRES) 0.1 MG tablet Take 0.1 mg by mouth at bedtime.  . famotidine (PEPCID) 40 MG tablet Take 40 mg by mouth at bedtime.  . furosemide (LASIX) 20 MG tablet Take 20 mg by mouth. TAKE 1 TABLET BY MOUTH DAILY FOR ANKLE SWELLING.  Marland Kitchen glucose blood (ONETOUCH VERIO) test strip Use to test blood sugars 3 times daily. DX:E11.65  . guanFACINE (TENEX) 2 MG tablet Take 2 mg by mouth at bedtime.   . insulin aspart protamine - aspart (NOVOLOG MIX 70/30 FLEXPEN) (70-30) 100 UNIT/ML FlexPen Inject into the skin in the morning, at noon, and at bedtime. Inject 12 units under the skin before breakfast, 9 units at lunch, and 14 units at dinner.  . Insulin Pen Needle (B-D ULTRAFINE III SHORT PEN) 31G X 8 MM MISC Use to inject insulin 4 times daily.  . Lancets Misc. (ACCU-CHEK FASTCLIX LANCET) KIT  Use as instructed to check blood sugar 4 times daily.  . lansoprazole (PREVACID) 15 MG capsule Take 15 mg by mouth every morning.  . meclizine (ANTIVERT) 12.5 MG tablet Take 12.5 mg by mouth daily as needed for dizziness.   . memantine (NAMENDA XR) 21 MG CP24 24 hr capsule Take 21 mg by mouth daily.  . Multiple Vitamin (MULTIVITAMIN WITH MINERALS) TABS tablet Take 1 tablet by mouth daily.  . Omega-3 Fatty Acids (FISH OIL) 1000 MG CAPS Take 1,000 mg by mouth daily.   . raloxifene (EVISTA) 60 MG tablet TAKE 1 TABLET BY MOUTH EVERY DAY  . ramipril (ALTACE) 10 MG capsule TAKE 1 CAPSULE (10 MG TOTAL) BY MOUTH 2 (TWO) TIMES DAILY. DO NOT TAKE SECOND-PM DOSE IF SYSTOLIC < 518  . repaglinide (PRANDIN) 2 MG tablet Take 2 mg by mouth daily.  Marland Kitchen rOPINIRole (REQUIP) 0.25 MG tablet Take 0.25 mg by mouth at bedtime.  . rosuvastatin (CRESTOR) 10 MG tablet TAKE 1 TABLET BY MOUTH EVERY DAY  . traZODone (DESYREL)  50 MG tablet Take 50 mg by mouth at bedtime as needed for sleep.      Allergies:   Codeine   Social History   Socioeconomic History  . Marital status: Unknown    Spouse name: Not on file  . Number of children: Not on file  . Years of education: Not on file  . Highest education level: Not on file  Occupational History  . Not on file  Tobacco Use  . Smoking status: Never Smoker  . Smokeless tobacco: Never Used  Substance and Sexual Activity  . Alcohol use: No  . Drug use: Never  . Sexual activity: Not on file  Other Topics Concern  . Not on file  Social History Narrative  . Not on file   Social Determinants of Health   Financial Resource Strain:   . Difficulty of Paying Living Expenses:   Food Insecurity:   . Worried About Charity fundraiser in the Last Year:   . Arboriculturist in the Last Year:   Transportation Needs:   . Film/video editor (Medical):   Marland Kitchen Lack of Transportation (Non-Medical):   Physical Activity:   . Days of Exercise per Week:   . Minutes of Exercise per  Session:   Stress:   . Feeling of Stress :   Social Connections:   . Frequency of Communication with Friends and Family:   . Frequency of Social Gatherings with Friends and Family:   . Attends Religious Services:   . Active Member of Clubs or Organizations:   . Attends Archivist Meetings:   Marland Kitchen Marital Status:      Family History: The patient's family history includes Heart attack in her father; Heart disease in her father; Hypertension in her mother. ROS:   Please see the history of present illness.    All other systems reviewed and are negative.  EKGs/Labs/Other Studies Reviewed:    The following studies were reviewed today:   Recent Labs: 03/02/2020: ALT 11; BUN 38; Creatinine, Ser 1.80; Potassium 5.0; Sodium 139; TSH 2.65  Recent Lipid Panel    Component Value Date/Time   CHOL 128 03/02/2020 1120   TRIG 190.0 (H) 03/02/2020 1120   HDL 37.70 (L) 03/02/2020 1120   CHOLHDL 3 03/02/2020 1120   VLDL 38.0 03/02/2020 1120   LDLCALC 53 03/02/2020 1120   LDLDIRECT 83.0 08/23/2018 1013    Physical Exam:    VS:  BP 140/60 (BP Location: Right Arm, Patient Position: Sitting, Cuff Size: Normal)   Pulse (!) 56   Ht _0  (1.626 m)   Wt 185 lb (83.9 kg)   SpO2 96%   BMI 31.76 kg/m     Wt Readings from Last 3 Encounters:  04/13/20 185 lb (83.9 kg)  03/02/20 182 lb 12.8 oz (82.9 kg)  02/27/20 185 lb 6.4 oz (84.1 kg)     GEN:  Well nourished, well developed in no acute distress HEENT: Normal NECK: No JVD; No carotid bruits LYMPHATICS: No lymphadenopathy CARDIAC: RRR, no murmurs, rubs, gallops RESPIRATORY:  Clear to auscultation without rales, wheezing or rhonchi  ABDOMEN: Soft, non-tender, non-distended MUSCULOSKELETAL:  No edema; No deformity  SKIN: Warm and dry NEUROLOGIC:  Alert and oriented x 3 PSYCHIATRIC:  Normal affect    Signed, Shirlee More, MD  04/13/2020 2:15 PM    [facility]

## 2020-04-14 ENCOUNTER — Telehealth: Payer: Self-pay

## 2020-04-14 LAB — BASIC METABOLIC PANEL
BUN/Creatinine Ratio: 18 (ref 12–28)
BUN: 35 mg/dL (ref 10–36)
CO2: 23 mmol/L (ref 20–29)
Calcium: 9 mg/dL (ref 8.7–10.3)
Chloride: 102 mmol/L (ref 96–106)
Creatinine, Ser: 1.95 mg/dL — ABNORMAL HIGH (ref 0.57–1.00)
GFR calc Af Amer: 26 mL/min/{1.73_m2} — ABNORMAL LOW (ref >59)
GFR calc non Af Amer: 22 mL/min/{1.73_m2} — ABNORMAL LOW (ref >59)
Glucose: 246 mg/dL — ABNORMAL HIGH (ref 65–99)
Potassium: 5.1 mmol/L (ref 3.5–5.2)
Sodium: 139 mmol/L (ref 134–144)

## 2020-04-14 LAB — PRO B NATRIURETIC PEPTIDE: NT-Pro BNP: 369 pg/mL (ref 0–738)

## 2020-04-14 NOTE — Telephone Encounter (Signed)
-----   Message from Richardo Priest, MD sent at 04/14/2020 10:58 AM EDT ----- Normal or stable result  No changes

## 2020-04-14 NOTE — Telephone Encounter (Signed)
Left message on patients voicemail to please return our call.   

## 2020-04-14 NOTE — Telephone Encounter (Signed)
Spoke with patients daughter regarding results and recommendation.  She verbalizes understanding and is agreeable to plan of care. She states that she will let her mother know. Advised patient to call back with any issues or concerns.

## 2020-04-15 DIAGNOSIS — E1122 Type 2 diabetes mellitus with diabetic chronic kidney disease: Secondary | ICD-10-CM | POA: Diagnosis not present

## 2020-04-15 DIAGNOSIS — F0390 Unspecified dementia without behavioral disturbance: Secondary | ICD-10-CM | POA: Diagnosis not present

## 2020-04-15 DIAGNOSIS — I248 Other forms of acute ischemic heart disease: Secondary | ICD-10-CM | POA: Diagnosis not present

## 2020-04-15 DIAGNOSIS — I5031 Acute diastolic (congestive) heart failure: Secondary | ICD-10-CM | POA: Diagnosis not present

## 2020-04-15 DIAGNOSIS — N1832 Chronic kidney disease, stage 3b: Secondary | ICD-10-CM | POA: Diagnosis not present

## 2020-04-15 DIAGNOSIS — I130 Hypertensive heart and chronic kidney disease with heart failure and stage 1 through stage 4 chronic kidney disease, or unspecified chronic kidney disease: Secondary | ICD-10-CM | POA: Diagnosis not present

## 2020-04-19 DIAGNOSIS — N1832 Chronic kidney disease, stage 3b: Secondary | ICD-10-CM | POA: Diagnosis not present

## 2020-04-19 DIAGNOSIS — E1122 Type 2 diabetes mellitus with diabetic chronic kidney disease: Secondary | ICD-10-CM | POA: Diagnosis not present

## 2020-04-19 DIAGNOSIS — I130 Hypertensive heart and chronic kidney disease with heart failure and stage 1 through stage 4 chronic kidney disease, or unspecified chronic kidney disease: Secondary | ICD-10-CM | POA: Diagnosis not present

## 2020-04-19 DIAGNOSIS — F0390 Unspecified dementia without behavioral disturbance: Secondary | ICD-10-CM | POA: Diagnosis not present

## 2020-04-19 DIAGNOSIS — I5031 Acute diastolic (congestive) heart failure: Secondary | ICD-10-CM | POA: Diagnosis not present

## 2020-04-19 DIAGNOSIS — I248 Other forms of acute ischemic heart disease: Secondary | ICD-10-CM | POA: Diagnosis not present

## 2020-04-21 DIAGNOSIS — I5031 Acute diastolic (congestive) heart failure: Secondary | ICD-10-CM | POA: Diagnosis not present

## 2020-04-21 DIAGNOSIS — I248 Other forms of acute ischemic heart disease: Secondary | ICD-10-CM | POA: Diagnosis not present

## 2020-04-21 DIAGNOSIS — F0390 Unspecified dementia without behavioral disturbance: Secondary | ICD-10-CM | POA: Diagnosis not present

## 2020-04-21 DIAGNOSIS — I130 Hypertensive heart and chronic kidney disease with heart failure and stage 1 through stage 4 chronic kidney disease, or unspecified chronic kidney disease: Secondary | ICD-10-CM | POA: Diagnosis not present

## 2020-04-21 DIAGNOSIS — E1122 Type 2 diabetes mellitus with diabetic chronic kidney disease: Secondary | ICD-10-CM | POA: Diagnosis not present

## 2020-04-21 DIAGNOSIS — N1832 Chronic kidney disease, stage 3b: Secondary | ICD-10-CM | POA: Diagnosis not present

## 2020-04-27 ENCOUNTER — Other Ambulatory Visit: Payer: Self-pay

## 2020-04-27 ENCOUNTER — Ambulatory Visit (INDEPENDENT_AMBULATORY_CARE_PROVIDER_SITE_OTHER): Payer: Medicare Other | Admitting: Endocrinology

## 2020-04-27 ENCOUNTER — Encounter: Payer: Self-pay | Admitting: Endocrinology

## 2020-04-27 VITALS — BP 150/64 | HR 58 | Ht 64.0 in | Wt 182.2 lb

## 2020-04-27 DIAGNOSIS — E1165 Type 2 diabetes mellitus with hyperglycemia: Secondary | ICD-10-CM | POA: Diagnosis not present

## 2020-04-27 DIAGNOSIS — Z794 Long term (current) use of insulin: Secondary | ICD-10-CM | POA: Diagnosis not present

## 2020-04-27 DIAGNOSIS — I10 Essential (primary) hypertension: Secondary | ICD-10-CM | POA: Diagnosis not present

## 2020-04-27 NOTE — Patient Instructions (Addendum)
Take 10mg  amlodipine  Supper dose 12 units also

## 2020-04-27 NOTE — Progress Notes (Signed)
Patient ID: Vanessa Craig, female   DOB: 01/16/1928, 86 y.o.   MRN: [id number]     Reason for Appointment: follow-up   History of Present Illness   Diagnosis: Type 2 DIABETES MELITUS, date of diagnosis: 1995        She has had mild and usually well-controlled diabetes for several years Previously on metformin and this was stopped because of renal dysfunction Also has been tried on low dose basal insulin previously However with using GLP-1 drugs like [place] and Victoza her blood sugars have been fairly well controlled She has been on Victoza since 2011   Because of significantly high readings including over 300 in October 2017 she was started on low-dose premixed insulin at suppertime  Recent history:  Non-insulin hypoglycemic drugs:.   Prandin 2 mg at lunch  INSULIN regimen: NovoLog Mix 12 before breakfast, 12 before lunch and 14 before dinner.  TRESIBA ?  8 units acs  daily  Her A1c is last higher at 8.9    Current management, blood sugar patterns and problems identified:  She did bring her meter today  Her blood sugars appear to be better overall especially in the afternoon with increasing her lunchtime dose to 12 units instead of 9 units.  She is still taking Prandin at lunch  However blood sugars overall are still fluctuating significantly  In the last week or 10 days her blood sugars are generally more controlled  Her daughter is giving her smaller amounts of carbohydrates and usually no bread at dinnertime  However she has only a couple of readings in the evenings after dinner  No hypoglycemia with lowest blood sugar 70 in the morning a couple weeks ago  Also no symptoms of hypoglycemia  Highest blood sugar 281 in the morning and 304 late afternoon  She is getting protein in the morning with cheese or an egg along with her cereal  Not able to do much exercise or walking with getting out of breath   Hypoglycemia: As above  Side effects from  medications: Nausea from Victoza, edema from Actos  GLUCOSE MONITORING FREQUENCY:  Currently about 2 times a day.    Glucometer: One Touch Verio       Blood Glucose readings by download with median readings    PRE-MEAL Fasting Lunch Dinner Bedtime Overall  Glucose range:  70-281   95-304    Mean/median: 149   173  167   POST-MEAL PC Breakfast PC Lunch PC Dinner  Glucose range:    123-273  Mean/median:      Previous readings:  PRE-MEAL Fasting Lunch Dinner Bedtime Overall  Glucose range:  94-125   180-220 228   Mean/median:          Meals: 3 meals per day. Dinner 6-7pm  Special K Cereal or raisin bran, rarely eggs, variable carbohydrate intake at other meals          Dietician visit: Most recent: Years ago          Wt Readings from Last 3 Encounters:  04/27/20 182 lb 3.2 oz (82.6 kg)  04/13/20 185 lb (83.9 kg)  03/02/20 182 lb 12.8 oz (82.9 kg)    Lab Results  Component Value Date   HGBA1C 8.9 (A) 03/02/2020   HGBA1C 8.5 (A) 12/03/2019   HGBA1C 7.7 (A) 09/01/2019   Lab Results  Component Value Date   MICROALBUR 18.2 (H) 03/02/2020   LDLCALC 53 03/02/2020   CREATININE 1.95 (H) 04/13/2020  OTHER active problems: See review of systems   No visits with results within 1 Week(s) from this visit.  Latest known visit with results is:  Office Visit on 04/13/2020  Component Date Value Ref Range Status   Glucose 04/13/2020 246* 65 - 99 mg/dL Final   BUN 04/13/2020 35  10 - 36 mg/dL Final   Creatinine, Ser 04/13/2020 1.95* 0.57 - 1.00 mg/dL Final   GFR calc non Af Amer 04/13/2020 22* >59 mL/min/1.73 Final   GFR calc Af Amer 04/13/2020 26* >59 mL/min/1.73 Final   Comment: **[facility] currently reports eGFR in compliance with the current**   recommendations of the [employer]. [facility] will   update reporting as new guidelines are published from the NKF-ASN   Task force.    BUN/Creatinine Ratio 04/13/2020 18  12 - 28 Final   Sodium 04/13/2020  139  134 - 144 mmol/L Final   Potassium 04/13/2020 5.1  3.5 - 5.2 mmol/L Final   Chloride 04/13/2020 102  96 - 106 mmol/L Final   CO2 04/13/2020 23  20 - 29 mmol/L Final   Calcium 04/13/2020 9.0  8.7 - 10.3 mg/dL Final   NT-Pro BNP 04/13/2020 369  0 - 738 pg/mL Final   Comment: The following cut-points have been suggested for the use of proBNP for the diagnostic evaluation of heart failure (HF) in patients with acute dyspnea: Modality                     Age           Optimal Cut                            (years)            Point ------------------------------------------------------ Diagnosis (rule in HF)        <50            450 pg/mL                           50 - 75            900 pg/mL                               >75           1800 pg/mL Exclusion (rule out HF)  Age independent     300 pg/mL     Allergies as of 04/27/2020      Reactions   Codeine Nausea And Vomiting      Medication List       Accurate as of April 27, 2020 10:51 AM. If you have any questions, ask your nurse or doctor.        Accu-Chek [employer] Kit Use as instructed to check blood sugar 4 times daily.   Accu-Chek FastClix Lancets Misc Use as instructed to check blood sugar 4 times daily.   amLODipine 10 MG tablet Commonly known as: NORVASC Take 10 mg by mouth daily.   aspirin 81 MG tablet Take 81 mg by mouth at bedtime.   B-D ULTRAFINE III SHORT PEN 31G X 8 MM Misc Generic drug: Insulin Pen Needle Use to inject insulin 4 times daily.   carvedilol 6.25 MG tablet Commonly known as: COREG Take 0.5 tablets (3.125 mg total) by mouth 2 (two) times daily with  a meal. Do not take the second if HR <55 BPM   cloNIDine 0.1 MG tablet Commonly known as: CATAPRES Take 0.1 mg by mouth See admin instructions. Take 1 tablet by mouth once daily if systolic BP is over 500.   famotidine 40 MG tablet Commonly known as: PEPCID Take 40 mg by mouth at bedtime.   Fish Oil 1000 MG Caps Take 1,000 mg  by mouth daily.   furosemide 20 MG tablet Commonly known as: LASIX Take 20 mg by mouth. TAKE 1 TABLET BY MOUTH DAILY FOR ANKLE SWELLING.   guanFACINE 2 MG tablet Commonly known as: TENEX Take 2 mg by mouth at bedtime.   lansoprazole 15 MG capsule Commonly known as: PREVACID Take 15 mg by mouth every morning.   meclizine 12.5 MG tablet Commonly known as: ANTIVERT Take 12.5 mg by mouth daily as needed for dizziness.   memantine 21 MG Cp24 24 hr capsule Commonly known as: NAMENDA XR Take 21 mg by mouth daily.   multivitamin with minerals Tabs tablet Take 1 tablet by mouth daily.   nitroGLYCERIN 0.4 MG SL tablet Commonly known as: NITROSTAT Place 1 tablet (0.4 mg total) under the tongue every 5 (five) minutes as needed for chest pain.   NovoLOG Mix 70/30 FlexPen (70-30) 100 UNIT/ML FlexPen Generic drug: insulin aspart protamine - aspart Inject into the skin in the morning, at noon, and at bedtime. Inject 12 units under the skin before breakfast, 12 units at lunch, and 14 units at dinner.   OneTouch Verio test strip Generic drug: glucose blood Use to test blood sugars 3 times daily. DX:E11.65   raloxifene 60 MG tablet Commonly known as: EVISTA TAKE 1 TABLET BY MOUTH EVERY DAY   ramipril 10 MG capsule Commonly known as: ALTACE TAKE 1 CAPSULE (10 MG TOTAL) BY MOUTH 2 (TWO) TIMES DAILY. DO NOT TAKE SECOND-PM DOSE IF SYSTOLIC < 938   repaglinide 2 MG tablet Commonly known as: PRANDIN Take 2 mg by mouth daily.   rOPINIRole 0.25 MG tablet Commonly known as: REQUIP Take 0.25 mg by mouth at bedtime.   rosuvastatin 10 MG tablet Commonly known as: CRESTOR TAKE 1 TABLET BY MOUTH EVERY DAY   traZODone 50 MG tablet Commonly known as: DESYREL Take 50 mg by mouth at bedtime as needed for sleep.   Vitamin D3 75 MCG (3000 UT) Tabs Take 1 capsule by mouth at bedtime.       Allergies:  Allergies  Allergen Reactions   Codeine Nausea And Vomiting    Past Medical  History:  Diagnosis Date   Acute congestive heart failure with left ventricular diastolic dysfunction ([facility])    Acute renal failure superimposed on stage 3b chronic kidney disease ([facility])    Acute respiratory failure ([facility])    Chest pain    Chronic kidney disease, stage III (moderate) 08/06/2013   Dementia ([place])    Diabetes mellitus without complication ([place])    Diabetic polyneuropathy associated with type 2 diabetes mellitus ([place]) 04/25/2018   DM (diabetes mellitus) ([place]) 07/31/2013   Esophageal reflux 03/08/2018   Essential hypertension 08/24/2016   Hammer toe of left foot 04/25/2018   Hypercholesterolemia    Hypertension    Hypertensive chronic kidney disease 08/04/2013   Hypertensive CKD (chronic kidney disease) 08/24/2016   Hypertensive emergency    Obesity 03/08/2018   Onychomycosis with ingrown toenail 04/25/2018   Other and unspecified hyperlipidemia 08/06/2013   PUD (peptic ulcer disease) 03/08/2018   Sinus bradycardia 03/08/2018   Type  2 diabetes mellitus with hyperglycemia ([place])    Unspecified essential hypertension 08/04/2013    Past Surgical History:  Procedure Laterality Date   APPENDECTOMY      Family History  Problem Relation Age of Onset   Hypertension Mother    Heart disease Father    Heart attack Father     Social History:  reports that she has never smoked. She has never used smokeless tobacco. She reports that she does not drink alcohol or use drugs.  Review of Systems:  THYROID NODULE:  This was incidentally discovered ULTRASOUND of thyroid was done by her PCP and showed a 3.4 cm mostly solid isoechoic nodule, this was done on 05/29/2018 This is not clinically palpable    Osteopenia: She is on Evista from her PCP   HYPERTENSION:  she has had long-standing hypertension treated with multiple drugs including 20 mg ramipril Followed by PCP and cardiologist  Her daughter will probably check her Home BP periodically but she thinks it is  usually at least 696 systolic upto 295 She will give her clonidine tablet if blood pressure is over 284 systolic and was 132 last night However blood pressure is still relatively high today Previously had been told to reduce amlodipine to half tablet because of edema but this is now being managed with Lasix  BP Readings from Last 3 Encounters:  04/27/20 (!) 150/64  04/13/20 140/60  03/02/20 130/60   EDEMA: This has been controlled, continues on 20 mg Lasix   Renal dysfunction: She has had persistent renal dysfunction but this has been  stable for several years Recently followed by cardiologist  Lab Results  Component Value Date   CREATININE 1.95 (H) 04/13/2020   CREATININE 1.80 (H) 03/02/2020   CREATININE 1.9 (A) 02/25/2020     HYPERLIPIDEMIA: The lipid abnormality consists of elevated LDL and high triglycerides  She is taking Crestor 10 mg and fenofibrate 160 mg daily Labs as follows   Lab Results  Component Value Date   CHOL 128 03/02/2020   HDL 37.70 (L) 03/02/2020   LDLCALC 53 03/02/2020   LDLDIRECT 83.0 08/23/2018   TRIG 190.0 (H) 03/02/2020   CHOLHDL 3 03/02/2020     Examination:   BP (!) 150/64 (BP Location: Left Arm, Patient Position: Sitting, Cuff Size: Normal)    Pulse (!) 58    Ht '5\' 4"'$  (1.626 m)    Wt 182 lb 3.2 oz (82.6 kg)    SpO2 96%    BMI 31.27 kg/m   Body mass index is 31.27 kg/m.     No significant pedal edema present   ASSESSMENT/ PLAN:   Diabetes type 2 long-standing  See history of present illness for detailed discussion of  current management, blood sugar patterns and problems identified  Her A1c was last 8.9  She is on a regimen of premixed insulin and Prandin, not clear if she is taking [place] as prescribed previously  She is requiring overall relatively high doses of insulin compared to her previous regimen when she was taking Actos which was stopped Also recently blood sugars are improving and likely better with her staying with  her daughter Blood sugars appear to be low normal at times after dinner and fasting but no hypoglycemia Blood sugars are improved at dinnertime with using 12 units of premixed insulin No hypoglycemia  Recommendations:  Reduce suppertime premixed insulin to 12 units also  Check more blood sugars after dinner  Continue other doses unchanged  She can check blood  sugars sometimes midmorning or midafternoon also  Make sure she has protein with every meal  If her blood sugars are tending to be low normal after dinner may have a carbohydrate serving in addition   LIPIDS: Have been well controlled  RENAL dysfunction: Stable recently  HYPERTENSION: On the multiple drugs including ramipril However appears to have consistently high systolic readings She can go back to 10 mg of amlodipine instead of 5 since her edema is managed with diuretics  There are no Patient Instructions on file for this visit.   Elayne Snare 04/27/2020, 10:51 AM

## 2020-04-29 ENCOUNTER — Other Ambulatory Visit: Payer: Self-pay

## 2020-04-29 ENCOUNTER — Telehealth: Payer: Self-pay

## 2020-04-29 NOTE — Telephone Encounter (Signed)
Please added to the patient's medication list.

## 2020-04-29 NOTE — Telephone Encounter (Signed)
Pt's daughter returned phone call from earlier this week to clarify amount of Vanessa Craig the pt is taking. Daughter reports that the pt is taking 10 units of Tresiba every evening at suppertime.

## 2020-04-29 NOTE — Telephone Encounter (Signed)
This medication has been added to the pt's chart.

## 2020-05-02 ENCOUNTER — Other Ambulatory Visit: Payer: Self-pay | Admitting: Endocrinology

## 2020-05-03 NOTE — Telephone Encounter (Signed)
Patient called again checking to make sure we got this request - says on her last pen and she needs a refill by this afternoon.

## 2020-05-05 ENCOUNTER — Telehealth: Payer: Self-pay

## 2020-05-05 NOTE — Telephone Encounter (Signed)
FAXED Vanessa Craig: [facility]  Document: request for retinopathy report Other records requested: none at this time.  All above requested information has been faxed successfully to [employer] listed above. Documents and fax confirmation have been placed in the faxed file for future reference.

## 2020-05-21 DIAGNOSIS — R05 Cough: Secondary | ICD-10-CM | POA: Diagnosis not present

## 2020-05-21 DIAGNOSIS — R079 Chest pain, unspecified: Secondary | ICD-10-CM | POA: Diagnosis not present

## 2020-05-21 DIAGNOSIS — R0789 Other chest pain: Secondary | ICD-10-CM | POA: Diagnosis not present

## 2020-05-21 DIAGNOSIS — J40 Bronchitis, not specified as acute or chronic: Secondary | ICD-10-CM | POA: Diagnosis not present

## 2020-06-01 ENCOUNTER — Other Ambulatory Visit: Payer: Self-pay | Admitting: Endocrinology

## 2020-06-16 DIAGNOSIS — I10 Essential (primary) hypertension: Secondary | ICD-10-CM | POA: Diagnosis not present

## 2020-06-17 DIAGNOSIS — J810 Acute pulmonary edema: Secondary | ICD-10-CM | POA: Diagnosis not present

## 2020-06-17 DIAGNOSIS — R079 Chest pain, unspecified: Secondary | ICD-10-CM | POA: Diagnosis not present

## 2020-06-17 DIAGNOSIS — F0390 Unspecified dementia without behavioral disturbance: Secondary | ICD-10-CM | POA: Diagnosis not present

## 2020-06-17 DIAGNOSIS — Z794 Long term (current) use of insulin: Secondary | ICD-10-CM | POA: Diagnosis not present

## 2020-06-17 DIAGNOSIS — J9601 Acute respiratory failure with hypoxia: Secondary | ICD-10-CM | POA: Diagnosis not present

## 2020-06-17 DIAGNOSIS — E1122 Type 2 diabetes mellitus with diabetic chronic kidney disease: Secondary | ICD-10-CM | POA: Diagnosis present

## 2020-06-17 DIAGNOSIS — I161 Hypertensive emergency: Secondary | ICD-10-CM | POA: Diagnosis not present

## 2020-06-17 DIAGNOSIS — Z7982 Long term (current) use of aspirin: Secondary | ICD-10-CM | POA: Diagnosis not present

## 2020-06-17 DIAGNOSIS — I501 Left ventricular failure: Secondary | ICD-10-CM | POA: Diagnosis present

## 2020-06-17 DIAGNOSIS — J9621 Acute and chronic respiratory failure with hypoxia: Secondary | ICD-10-CM | POA: Diagnosis present

## 2020-06-17 DIAGNOSIS — E1165 Type 2 diabetes mellitus with hyperglycemia: Secondary | ICD-10-CM | POA: Diagnosis not present

## 2020-06-17 DIAGNOSIS — R0789 Other chest pain: Secondary | ICD-10-CM | POA: Diagnosis not present

## 2020-06-17 DIAGNOSIS — R0602 Shortness of breath: Secondary | ICD-10-CM | POA: Diagnosis not present

## 2020-06-17 DIAGNOSIS — R0689 Other abnormalities of breathing: Secondary | ICD-10-CM | POA: Diagnosis not present

## 2020-06-17 DIAGNOSIS — E7800 Pure hypercholesterolemia, unspecified: Secondary | ICD-10-CM | POA: Diagnosis not present

## 2020-06-17 DIAGNOSIS — K219 Gastro-esophageal reflux disease without esophagitis: Secondary | ICD-10-CM | POA: Diagnosis present

## 2020-06-17 DIAGNOSIS — Z9049 Acquired absence of other specified parts of digestive tract: Secondary | ICD-10-CM | POA: Diagnosis not present

## 2020-06-17 DIAGNOSIS — R0902 Hypoxemia: Secondary | ICD-10-CM | POA: Diagnosis not present

## 2020-06-17 DIAGNOSIS — I5031 Acute diastolic (congestive) heart failure: Secondary | ICD-10-CM | POA: Diagnosis not present

## 2020-06-17 DIAGNOSIS — Z885 Allergy status to narcotic agent status: Secondary | ICD-10-CM | POA: Diagnosis not present

## 2020-06-17 DIAGNOSIS — I10 Essential (primary) hypertension: Secondary | ICD-10-CM | POA: Diagnosis not present

## 2020-06-17 DIAGNOSIS — Z79899 Other long term (current) drug therapy: Secondary | ICD-10-CM | POA: Diagnosis not present

## 2020-06-17 DIAGNOSIS — I509 Heart failure, unspecified: Secondary | ICD-10-CM | POA: Diagnosis not present

## 2020-06-17 DIAGNOSIS — N1832 Chronic kidney disease, stage 3b: Secondary | ICD-10-CM | POA: Diagnosis present

## 2020-06-17 DIAGNOSIS — I110 Hypertensive heart disease with heart failure: Secondary | ICD-10-CM | POA: Diagnosis present

## 2020-06-17 DIAGNOSIS — I130 Hypertensive heart and chronic kidney disease with heart failure and stage 1 through stage 4 chronic kidney disease, or unspecified chronic kidney disease: Secondary | ICD-10-CM | POA: Diagnosis present

## 2020-06-17 DIAGNOSIS — N179 Acute kidney failure, unspecified: Secondary | ICD-10-CM | POA: Diagnosis not present

## 2020-06-21 DIAGNOSIS — E7800 Pure hypercholesterolemia, unspecified: Secondary | ICD-10-CM

## 2020-06-21 DIAGNOSIS — I10 Essential (primary) hypertension: Secondary | ICD-10-CM

## 2020-06-21 DIAGNOSIS — E1165 Type 2 diabetes mellitus with hyperglycemia: Secondary | ICD-10-CM

## 2020-06-21 DIAGNOSIS — I5031 Acute diastolic (congestive) heart failure: Secondary | ICD-10-CM

## 2020-06-24 ENCOUNTER — Telehealth: Payer: Self-pay | Admitting: Cardiology

## 2020-06-24 NOTE — Telephone Encounter (Signed)
Spoke with the patients daughter just now and let her know that per Dr. Bettina Gavia she can take the 25 mg daily. I advised for her to monitor her BP and HR before and after taking this medication and keep a diary of these to bring to her next visit.    Encouraged patient to call back with any questions or concerns.

## 2020-06-24 NOTE — Telephone Encounter (Signed)
New Message  Pt c/o medication issue:  1. Name of Medication: carvedilol (COREG) 6.25 MG tablet  2. How are you currently taking this medication (dosage and times per day)? As directed  3. Are you having a reaction (difficulty breathing--STAT)? No   4. What is your medication issue? Patient's daughter recently was told at [facility] to increase the carvedilol to 25 mg. Patient's daughter is calling in to check with Dr. Bettina Gavia about medication and preferred dosage. Please give a call back.

## 2020-06-27 DIAGNOSIS — N1832 Chronic kidney disease, stage 3b: Secondary | ICD-10-CM | POA: Diagnosis present

## 2020-06-27 DIAGNOSIS — Z794 Long term (current) use of insulin: Secondary | ICD-10-CM | POA: Diagnosis not present

## 2020-06-27 DIAGNOSIS — E785 Hyperlipidemia, unspecified: Secondary | ICD-10-CM | POA: Diagnosis present

## 2020-06-27 DIAGNOSIS — I5033 Acute on chronic diastolic (congestive) heart failure: Secondary | ICD-10-CM | POA: Diagnosis present

## 2020-06-27 DIAGNOSIS — R0789 Other chest pain: Secondary | ICD-10-CM | POA: Diagnosis not present

## 2020-06-27 DIAGNOSIS — Z79899 Other long term (current) drug therapy: Secondary | ICD-10-CM | POA: Diagnosis not present

## 2020-06-27 DIAGNOSIS — N289 Disorder of kidney and ureter, unspecified: Secondary | ICD-10-CM | POA: Diagnosis not present

## 2020-06-27 DIAGNOSIS — I130 Hypertensive heart and chronic kidney disease with heart failure and stage 1 through stage 4 chronic kidney disease, or unspecified chronic kidney disease: Secondary | ICD-10-CM | POA: Diagnosis not present

## 2020-06-27 DIAGNOSIS — I10 Essential (primary) hypertension: Secondary | ICD-10-CM | POA: Diagnosis not present

## 2020-06-27 DIAGNOSIS — N179 Acute kidney failure, unspecified: Secondary | ICD-10-CM | POA: Diagnosis not present

## 2020-06-27 DIAGNOSIS — I214 Non-ST elevation (NSTEMI) myocardial infarction: Secondary | ICD-10-CM | POA: Diagnosis not present

## 2020-06-27 DIAGNOSIS — I5032 Chronic diastolic (congestive) heart failure: Secondary | ICD-10-CM | POA: Diagnosis not present

## 2020-06-27 DIAGNOSIS — Z885 Allergy status to narcotic agent status: Secondary | ICD-10-CM | POA: Diagnosis not present

## 2020-06-27 DIAGNOSIS — F0390 Unspecified dementia without behavioral disturbance: Secondary | ICD-10-CM | POA: Diagnosis present

## 2020-06-27 DIAGNOSIS — R0602 Shortness of breath: Secondary | ICD-10-CM | POA: Diagnosis not present

## 2020-06-27 DIAGNOSIS — E1165 Type 2 diabetes mellitus with hyperglycemia: Secondary | ICD-10-CM | POA: Diagnosis not present

## 2020-06-27 DIAGNOSIS — E1122 Type 2 diabetes mellitus with diabetic chronic kidney disease: Secondary | ICD-10-CM | POA: Diagnosis present

## 2020-06-27 DIAGNOSIS — I209 Angina pectoris, unspecified: Secondary | ICD-10-CM | POA: Diagnosis not present

## 2020-06-27 DIAGNOSIS — I959 Hypotension, unspecified: Secondary | ICD-10-CM | POA: Diagnosis not present

## 2020-06-27 DIAGNOSIS — R079 Chest pain, unspecified: Secondary | ICD-10-CM | POA: Diagnosis not present

## 2020-06-27 DIAGNOSIS — R0902 Hypoxemia: Secondary | ICD-10-CM | POA: Diagnosis not present

## 2020-06-27 DIAGNOSIS — Z7982 Long term (current) use of aspirin: Secondary | ICD-10-CM | POA: Diagnosis not present

## 2020-06-27 DIAGNOSIS — K219 Gastro-esophageal reflux disease without esophagitis: Secondary | ICD-10-CM | POA: Diagnosis not present

## 2020-07-06 DIAGNOSIS — I214 Non-ST elevation (NSTEMI) myocardial infarction: Secondary | ICD-10-CM | POA: Diagnosis not present

## 2020-07-06 DIAGNOSIS — E7800 Pure hypercholesterolemia, unspecified: Secondary | ICD-10-CM | POA: Diagnosis not present

## 2020-07-06 DIAGNOSIS — Z794 Long term (current) use of insulin: Secondary | ICD-10-CM | POA: Diagnosis not present

## 2020-07-06 DIAGNOSIS — Z7982 Long term (current) use of aspirin: Secondary | ICD-10-CM | POA: Diagnosis not present

## 2020-07-06 DIAGNOSIS — E1122 Type 2 diabetes mellitus with diabetic chronic kidney disease: Secondary | ICD-10-CM | POA: Diagnosis not present

## 2020-07-06 DIAGNOSIS — K219 Gastro-esophageal reflux disease without esophagitis: Secondary | ICD-10-CM | POA: Diagnosis not present

## 2020-07-06 DIAGNOSIS — E1165 Type 2 diabetes mellitus with hyperglycemia: Secondary | ICD-10-CM | POA: Diagnosis not present

## 2020-07-06 DIAGNOSIS — N1832 Chronic kidney disease, stage 3b: Secondary | ICD-10-CM | POA: Diagnosis not present

## 2020-07-06 DIAGNOSIS — I5032 Chronic diastolic (congestive) heart failure: Secondary | ICD-10-CM | POA: Diagnosis not present

## 2020-07-06 DIAGNOSIS — F0390 Unspecified dementia without behavioral disturbance: Secondary | ICD-10-CM | POA: Diagnosis not present

## 2020-07-06 DIAGNOSIS — I130 Hypertensive heart and chronic kidney disease with heart failure and stage 1 through stage 4 chronic kidney disease, or unspecified chronic kidney disease: Secondary | ICD-10-CM | POA: Diagnosis not present

## 2020-07-06 DIAGNOSIS — M1990 Unspecified osteoarthritis, unspecified site: Secondary | ICD-10-CM | POA: Diagnosis not present

## 2020-07-06 DIAGNOSIS — Z9181 History of falling: Secondary | ICD-10-CM | POA: Diagnosis not present

## 2020-07-08 ENCOUNTER — Other Ambulatory Visit: Payer: Self-pay | Admitting: Endocrinology

## 2020-07-08 DIAGNOSIS — E782 Mixed hyperlipidemia: Secondary | ICD-10-CM

## 2020-07-08 NOTE — Telephone Encounter (Signed)
Outpatient Medication Detail   Disp Refills Start End   rosuvastatin (CRESTOR) 10 MG tablet 90 tablet 1 07/08/2020    Sig: TAKE 1 TABLET BY MOUTH EVERY DAY   Sent to pharmacy as: rosuvastatin (CRESTOR) 10 MG tablet   E-Prescribing Status: Receipt confirmed by pharmacy (07/08/2020 3:32 PM EDT)

## 2020-07-12 ENCOUNTER — Other Ambulatory Visit: Payer: Self-pay

## 2020-07-12 DIAGNOSIS — I10 Essential (primary) hypertension: Secondary | ICD-10-CM | POA: Insufficient documentation

## 2020-07-12 DIAGNOSIS — R079 Chest pain, unspecified: Secondary | ICD-10-CM | POA: Insufficient documentation

## 2020-07-12 DIAGNOSIS — E119 Type 2 diabetes mellitus without complications: Secondary | ICD-10-CM | POA: Insufficient documentation

## 2020-07-12 DIAGNOSIS — N1832 Chronic kidney disease, stage 3b: Secondary | ICD-10-CM | POA: Diagnosis not present

## 2020-07-12 DIAGNOSIS — E1122 Type 2 diabetes mellitus with diabetic chronic kidney disease: Secondary | ICD-10-CM | POA: Diagnosis not present

## 2020-07-12 DIAGNOSIS — E7800 Pure hypercholesterolemia, unspecified: Secondary | ICD-10-CM | POA: Insufficient documentation

## 2020-07-12 DIAGNOSIS — F0390 Unspecified dementia without behavioral disturbance: Secondary | ICD-10-CM | POA: Insufficient documentation

## 2020-07-12 DIAGNOSIS — I5032 Chronic diastolic (congestive) heart failure: Secondary | ICD-10-CM | POA: Diagnosis not present

## 2020-07-12 DIAGNOSIS — I5031 Acute diastolic (congestive) heart failure: Secondary | ICD-10-CM | POA: Insufficient documentation

## 2020-07-12 DIAGNOSIS — J9600 Acute respiratory failure, unspecified whether with hypoxia or hypercapnia: Secondary | ICD-10-CM | POA: Insufficient documentation

## 2020-07-12 DIAGNOSIS — I130 Hypertensive heart and chronic kidney disease with heart failure and stage 1 through stage 4 chronic kidney disease, or unspecified chronic kidney disease: Secondary | ICD-10-CM | POA: Diagnosis not present

## 2020-07-12 DIAGNOSIS — E1165 Type 2 diabetes mellitus with hyperglycemia: Secondary | ICD-10-CM | POA: Insufficient documentation

## 2020-07-12 DIAGNOSIS — I214 Non-ST elevation (NSTEMI) myocardial infarction: Secondary | ICD-10-CM | POA: Diagnosis not present

## 2020-07-12 DIAGNOSIS — I161 Hypertensive emergency: Secondary | ICD-10-CM | POA: Insufficient documentation

## 2020-07-13 DIAGNOSIS — I130 Hypertensive heart and chronic kidney disease with heart failure and stage 1 through stage 4 chronic kidney disease, or unspecified chronic kidney disease: Secondary | ICD-10-CM | POA: Diagnosis not present

## 2020-07-13 DIAGNOSIS — N1832 Chronic kidney disease, stage 3b: Secondary | ICD-10-CM | POA: Diagnosis not present

## 2020-07-13 DIAGNOSIS — F0390 Unspecified dementia without behavioral disturbance: Secondary | ICD-10-CM | POA: Diagnosis not present

## 2020-07-13 DIAGNOSIS — E1122 Type 2 diabetes mellitus with diabetic chronic kidney disease: Secondary | ICD-10-CM | POA: Diagnosis not present

## 2020-07-13 DIAGNOSIS — I214 Non-ST elevation (NSTEMI) myocardial infarction: Secondary | ICD-10-CM | POA: Diagnosis not present

## 2020-07-13 DIAGNOSIS — I5032 Chronic diastolic (congestive) heart failure: Secondary | ICD-10-CM | POA: Diagnosis not present

## 2020-07-13 NOTE — Progress Notes (Signed)
Cardiology Office Note:    Date:  07/14/2020   ID:  Vanessa Craig, DOB [birth year], MRN [id number]  PCP:  Ernestene Kiel, MD  Cardiologist:  Shirlee More, MD    Referring MD: Ernestene Kiel, MD    ASSESSMENT:    1. Coronary artery disease involving native coronary artery of native heart with unstable angina pectoris ([place])   2. Chronic diastolic heart failure due to valvular disease ([place])   3. Hypertensive kidney disease, stage IV ([place])   4. Sinus bradycardia   5. Hypercholesterolemia    PLAN:    In order of problems listed above:  1. Stable recent acute coronary syndrome not a candidate for cardiac interventions with age comorbidity and progression of the stage IV CKD continue medical therapy she is having no angina pectoris.  We will send correspondence to be sure that she is not taking oral nitrates.  With her severe unremitting headache CT head no contrast ordered 2. Looks mildly decompensated will be sure she is taking the higher dose of diuretic 3. Worsen CKD 4. Stable 5. Continue statin with recent non-ST elevation MI 6. Headache check CT of head   Next appointment: 3 months   Medication Adjustments/Labs and Tests Ordered: Current medicines are reviewed at length with the patient today.  Concerns regarding medicines are outlined above.  No orders of the defined types were placed in this encounter.  No orders of the defined types were placed in this encounter.   Chief Complaint  Patient presents with  . Follow-up    Recent non-ST elevation MI with worsened heart failure progression to stage IV CKD and a decision to treat medically.  . Coronary Artery Disease    History of Present Illness:    Vanessa Craig is a 86 y.o. female with a hx of hypertension with heart failure, CKD, T2 DM, and sinus bradycardia .   She was admitted to [facility] March of this year with decompensated heart failure. She was last seen 04/13/2020.  She is also followed by  endocrinology for thyroid nodule and diabetes.  Compliance with diet, lifestyle and medications: Yes  She was seen by my partner Dr. Geraldo Pitter at [facility] 06/27/2020.  He notes that before providing to the hospital she had chest pain her troponin was elevated and was diagnosed as non-ST elevation MI.  He also noted that she has severe renal insufficiency.  I independently reviewed her EKG from the emergency room which showed sinus rhythm and minor nonspecific ST abnormality.  In the ED her troponin was elevated 1.34 and her GFR was significantly elevated at 1.90.  In hospital renal function worsened creatinine 2.6 with stage IV CKD GFR 17 cc heart failure is decompensated requiring increasing dose of diuretic and decision was made not to pursue cardiac interventions.  Her daughter is present participates in evaluation and decision making.  She is not doing well at skilled nursing she complains bitterly of constant headache and her daughter thinks that they stopped her oral mononitrate but is not sure.  She is having more edema more unsure of her diuretic dose I stopped open [employer] and send a note to be sure she is taking 40 mg of furosemide daily.  With her severe unremitting headache in hospital anticoagulation I ordered a noncontrast CT of the head.  She is having no angina or shortness of breath palpitation or syncope.  I reviewed with her daughter she is not a candidate for interventional cardiac procedures and she understands.  She  is aware of worsening kidney function. Past Medical History:  Diagnosis Date  . Acute congestive heart failure with left ventricular diastolic dysfunction ([place])   . Acute renal failure superimposed on stage 3b chronic kidney disease ([place])   . Acute respiratory failure ([place])   . Chest pain   . Chronic kidney disease, stage III (moderate) 08/06/2013  . Dementia ([place])   . Diabetes mellitus without complication ([place])   . Diabetic polyneuropathy associated with type  2 diabetes mellitus ([place]) 04/25/2018  . DM (diabetes mellitus) ([place]) 07/31/2013  . Esophageal reflux 03/08/2018  . Essential hypertension 08/24/2016  . Hammer toe of left foot 04/25/2018  . Hypercholesterolemia   . Hypertension   . Hypertensive chronic kidney disease 08/04/2013  . Hypertensive CKD (chronic kidney disease) 08/24/2016  . Hypertensive emergency   . Obesity 03/08/2018  . Onychomycosis with ingrown toenail 04/25/2018  . Other and unspecified hyperlipidemia 08/06/2013  . PUD (peptic ulcer disease) 03/08/2018  . Sinus bradycardia 03/08/2018  . Type 2 diabetes mellitus with hyperglycemia ([place])   . Unspecified essential hypertension 08/04/2013    Past Surgical History:  Procedure Laterality Date  . APPENDECTOMY      Current Medications: Current Meds  Medication Sig  . Accu-Chek FastClix Lancets MISC Use as instructed to check blood sugar 4 times daily.  Marland Kitchen amLODipine (NORVASC) 10 MG tablet Take 10 mg by mouth daily.  Marland Kitchen aspirin 81 MG tablet Take 81 mg by mouth at bedtime.   . carvedilol (COREG) 6.25 MG tablet Take 0.5 tablets (3.125 mg total) by mouth 2 (two) times daily with a meal. Do not take the second if HR <55 BPM  . Cholecalciferol (VITAMIN D3) 3000 UNITS TABS Take 1 capsule by mouth at bedtime.   . cloNIDine (CATAPRES) 0.1 MG tablet Take 0.1 mg by mouth See admin instructions. Take 1 tablet by mouth once daily if systolic BP is over 220.  . famotidine (PEPCID) 40 MG tablet Take 40 mg by mouth at bedtime.  . furosemide (LASIX) 20 MG tablet Take 20 mg by mouth. TAKE 1 TABLET BY MOUTH DAILY FOR ANKLE SWELLING.  Marland Kitchen guanFACINE (TENEX) 2 MG tablet Take 2 mg by mouth at bedtime.   . insulin aspart protamine - aspart (NOVOLOG MIX 70/30 FLEXPEN) (70-30) 100 UNIT/ML FlexPen Inject 0.12 mLs (12 Units total) into the skin 3 (three) times daily before meals.  . insulin degludec (TRESIBA FLEXTOUCH) 100 UNIT/ML FlexTouch Pen Inject 10 Units into the skin daily.  . Insulin Pen Needle (B-D  ULTRAFINE III SHORT PEN) 31G X 8 MM MISC Use to inject insulin 4 times daily.  . Lancets Misc. (ACCU-CHEK FASTCLIX LANCET) KIT Use as instructed to check blood sugar 4 times daily.  . lansoprazole (PREVACID) 15 MG capsule Take 15 mg by mouth every morning.  . meclizine (ANTIVERT) 12.5 MG tablet Take 12.5 mg by mouth daily as needed for dizziness.   . memantine (NAMENDA XR) 21 MG CP24 24 hr capsule Take 21 mg by mouth daily.  . Multiple Vitamin (MULTIVITAMIN WITH MINERALS) TABS tablet Take 1 tablet by mouth daily.  . nitroGLYCERIN (NITROSTAT) 0.4 MG SL tablet Place 1 tablet (0.4 mg total) under the tongue every 5 (five) minutes as needed for chest pain.  . Omega-3 Fatty Acids (FISH OIL) 1000 MG CAPS Take 1,000 mg by mouth daily.   Glory Rosebush VERIO test strip USE TO TEST BLOOD SUGARS 3 TIMES DAILY. DX:E11.65  . raloxifene (EVISTA) 60 MG tablet TAKE 1 TABLET BY MOUTH  EVERY DAY  . ramipril (ALTACE) 10 MG capsule TAKE 1 CAPSULE (10 MG TOTAL) BY MOUTH 2 (TWO) TIMES DAILY. DO NOT TAKE SECOND-PM DOSE IF SYSTOLIC < 371  . ranolazine (RANEXA) 500 MG 12 hr tablet Take 500 mg by mouth 2 (two) times daily.  . repaglinide (PRANDIN) 2 MG tablet Take 2 mg by mouth daily.  . rosuvastatin (CRESTOR) 10 MG tablet TAKE 1 TABLET BY MOUTH EVERY DAY     Allergies:   Codeine   Social History   Socioeconomic History  . Marital status: Unknown    Spouse name: Not on file  . Number of children: Not on file  . Years of education: Not on file  . Highest education level: Not on file  Occupational History  . Not on file  Tobacco Use  . Smoking status: Never Smoker  . Smokeless tobacco: Never Used  Vaping Use  . Vaping Use: Never used  Substance and Sexual Activity  . Alcohol use: No  . Drug use: Never  . Sexual activity: Not on file  Other Topics Concern  . Not on file  Social History Narrative  . Not on file   Social Determinants of Health   Financial Resource Strain:   . Difficulty of Paying Living  Expenses:   Food Insecurity:   . Worried About Charity fundraiser in the Last Year:   . Arboriculturist in the Last Year:   Transportation Needs:   . Film/video editor (Medical):   Marland Kitchen Lack of Transportation (Non-Medical):   Physical Activity:   . Days of Exercise per Week:   . Minutes of Exercise per Session:   Stress:   . Feeling of Stress :   Social Connections:   . Frequency of Communication with Friends and Family:   . Frequency of Social Gatherings with Friends and Family:   . Attends Religious Services:   . Active Member of Clubs or Organizations:   . Attends Archivist Meetings:   Marland Kitchen Marital Status:      Family History: The patient's family history includes Heart attack in her father; Heart disease in her father; Hypertension in her mother. ROS:   Please see the history of present illness.    All other systems reviewed and are negative.  EKGs/Labs/Other Studies Reviewed:    The following studies were reviewed today:    Recent Labs: 03/02/2020: ALT 11; TSH 2.65 04/13/2020: BUN 35; Creatinine, Ser 1.95; NT-Pro BNP 369; Potassium 5.1; Sodium 139  Recent Lipid Panel    Component Value Date/Time   CHOL 128 03/02/2020 1120   TRIG 190.0 (H) 03/02/2020 1120   HDL 37.70 (L) 03/02/2020 1120   CHOLHDL 3 03/02/2020 1120   VLDL 38.0 03/02/2020 1120   LDLCALC 53 03/02/2020 1120   LDLDIRECT 83.0 08/23/2018 1013    Physical Exam:    VS:  BP (!) 120/58   Pulse 60   Ht _0  (1.626 m)   Wt 178 lb (80.7 kg)   SpO2 94%   BMI 30.55 kg/m     Wt Readings from Last 3 Encounters:  07/14/20 178 lb (80.7 kg)  04/27/20 182 lb 3.2 oz (82.6 kg)  04/13/20 185 lb (83.9 kg)     GEN: Elderly appearing but alert well nourished, well developed in no acute distress HEENT: Normal NECK: No JVD; No carotid bruits LYMPHATICS: No lymphadenopathy CARDIAC: RRR, no murmurs, rubs, gallops RESPIRATORY:  Clear to auscultation without rales, wheezing or rhonchi  ABDOMEN: Soft,  non-tender, non-distended MUSCULOSKELETAL: 1-2+ bilateral lower extremity pitting edema; No deformity  SKIN: Warm and dry NEUROLOGIC:  Alert and oriented x 3 PSYCHIATRIC:  Normal affect    Signed, Shirlee More, MD  07/14/2020 1:08 PM    [facility]

## 2020-07-14 ENCOUNTER — Encounter: Payer: Self-pay | Admitting: Cardiology

## 2020-07-14 ENCOUNTER — Ambulatory Visit (INDEPENDENT_AMBULATORY_CARE_PROVIDER_SITE_OTHER): Payer: Medicare Other | Admitting: Cardiology

## 2020-07-14 ENCOUNTER — Telehealth: Payer: Self-pay | Admitting: Cardiology

## 2020-07-14 ENCOUNTER — Other Ambulatory Visit: Payer: Self-pay

## 2020-07-14 VITALS — BP 120/58 | HR 60 | Ht 64.0 in | Wt 178.0 lb

## 2020-07-14 DIAGNOSIS — I38 Endocarditis, valve unspecified: Secondary | ICD-10-CM

## 2020-07-14 DIAGNOSIS — I5032 Chronic diastolic (congestive) heart failure: Secondary | ICD-10-CM

## 2020-07-14 DIAGNOSIS — N184 Chronic kidney disease, stage 4 (severe): Secondary | ICD-10-CM

## 2020-07-14 DIAGNOSIS — I25110 Atherosclerotic heart disease of native coronary artery with unstable angina pectoris: Secondary | ICD-10-CM | POA: Diagnosis not present

## 2020-07-14 DIAGNOSIS — I129 Hypertensive chronic kidney disease with stage 1 through stage 4 chronic kidney disease, or unspecified chronic kidney disease: Secondary | ICD-10-CM | POA: Diagnosis not present

## 2020-07-14 DIAGNOSIS — E7800 Pure hypercholesterolemia, unspecified: Secondary | ICD-10-CM

## 2020-07-14 DIAGNOSIS — R001 Bradycardia, unspecified: Secondary | ICD-10-CM

## 2020-07-14 MED ORDER — FUROSEMIDE 40 MG PO TABS: 40.0000 mg | ORAL_TABLET | Freq: Every day | ORAL | 3 refills | Status: AC

## 2020-07-14 NOTE — Telephone Encounter (Signed)
Pt c/o medication issue:  1. Name of Medication: furosemide (LASIX) 40 MG tablet  2. How are you currently taking this medication (dosage and times per day)?   3. Are you having a reaction (difficulty breathing--STAT)?   4. What is your medication issue? Vanessa Craig is calling stating she saw Vanessa Craig states he wanted the pt to start this medication, but she is already taking this medication with this dosage. Vanessa Craig is wanting to know if he meant to increase it because she states she has gained 4 lbs in swelling since they weighed her versus the weight she was at her appt today. Please advise.

## 2020-07-14 NOTE — Patient Instructions (Signed)
Medication Instructions:  Your physician has recommended you make the following change in your medication:  STOP: Imdur START: Furosemide 40 mg take one tablet by mouth daily.  *If you need a refill on your cardiac medications before your next appointment, please call your pharmacy*   Lab Work: None If you have labs (blood work) drawn today and your tests are completely normal, you will receive your results only by: Marland Kitchen MyChart Message (if you have MyChart) OR . A paper copy in the mail If you have any lab test that is abnormal or we need to change your treatment, we will call you to review the results.   Testing/Procedures: None   Follow-Up: At [facility], you and your health needs are our priority.  As part of our continuing mission to provide you with exceptional heart care, we have created designated Provider Care Teams.  These Care Teams include your primary Cardiologist (physician) and Advanced Practice Providers (APPs -  Physician Assistants and Nurse Practitioners) who all work together to provide you with the care you need, when you need it.  We recommend signing up for the patient portal called "MyChart".  Sign up information is provided on this After Visit Summary.  MyChart is used to connect with patients for Virtual Visits (Telemedicine).  Patients are able to view lab/test results, encounter notes, upcoming appointments, etc.  Non-urgent messages can be sent to your provider as well.   To learn more about what you can do with MyChart, go to [email or web address].    Your next appointment:   3 month(s)  The format for your next appointment:   In Person  Provider:   Shirlee More, MD   Other Instructions

## 2020-07-14 NOTE — Telephone Encounter (Signed)
Spoke with Brittney and let her know that the patient told Dr. Bettina Gavia that she was unsure if she had been taking her furosmide 40 mg daily or not. This is why we gave them this prescription today. She verbalizes understanding and thanks me for the call back.    Encouraged patient to call back with any questions or concerns.

## 2020-07-16 DIAGNOSIS — F0390 Unspecified dementia without behavioral disturbance: Secondary | ICD-10-CM | POA: Diagnosis not present

## 2020-07-16 DIAGNOSIS — N1832 Chronic kidney disease, stage 3b: Secondary | ICD-10-CM | POA: Diagnosis not present

## 2020-07-16 DIAGNOSIS — I214 Non-ST elevation (NSTEMI) myocardial infarction: Secondary | ICD-10-CM | POA: Diagnosis not present

## 2020-07-16 DIAGNOSIS — I130 Hypertensive heart and chronic kidney disease with heart failure and stage 1 through stage 4 chronic kidney disease, or unspecified chronic kidney disease: Secondary | ICD-10-CM | POA: Diagnosis not present

## 2020-07-16 DIAGNOSIS — E1122 Type 2 diabetes mellitus with diabetic chronic kidney disease: Secondary | ICD-10-CM | POA: Diagnosis not present

## 2020-07-16 DIAGNOSIS — I5032 Chronic diastolic (congestive) heart failure: Secondary | ICD-10-CM | POA: Diagnosis not present

## 2020-07-17 ENCOUNTER — Other Ambulatory Visit: Payer: Self-pay | Admitting: Endocrinology

## 2020-07-19 DIAGNOSIS — F0390 Unspecified dementia without behavioral disturbance: Secondary | ICD-10-CM | POA: Diagnosis not present

## 2020-07-19 DIAGNOSIS — E1122 Type 2 diabetes mellitus with diabetic chronic kidney disease: Secondary | ICD-10-CM | POA: Diagnosis not present

## 2020-07-19 DIAGNOSIS — I5032 Chronic diastolic (congestive) heart failure: Secondary | ICD-10-CM | POA: Diagnosis not present

## 2020-07-19 DIAGNOSIS — N1832 Chronic kidney disease, stage 3b: Secondary | ICD-10-CM | POA: Diagnosis not present

## 2020-07-19 DIAGNOSIS — I130 Hypertensive heart and chronic kidney disease with heart failure and stage 1 through stage 4 chronic kidney disease, or unspecified chronic kidney disease: Secondary | ICD-10-CM | POA: Diagnosis not present

## 2020-07-19 DIAGNOSIS — I214 Non-ST elevation (NSTEMI) myocardial infarction: Secondary | ICD-10-CM | POA: Diagnosis not present

## 2020-07-19 NOTE — Telephone Encounter (Signed)
Yes, it has not been stopped

## 2020-07-19 NOTE — Telephone Encounter (Signed)
Do you wish to refill this rx?

## 2020-07-20 ENCOUNTER — Encounter: Payer: Self-pay | Admitting: Cardiology

## 2020-07-20 DIAGNOSIS — R519 Headache, unspecified: Secondary | ICD-10-CM | POA: Diagnosis not present

## 2020-07-20 DIAGNOSIS — I214 Non-ST elevation (NSTEMI) myocardial infarction: Secondary | ICD-10-CM | POA: Diagnosis not present

## 2020-07-20 DIAGNOSIS — I5032 Chronic diastolic (congestive) heart failure: Secondary | ICD-10-CM | POA: Diagnosis not present

## 2020-07-20 DIAGNOSIS — G319 Degenerative disease of nervous system, unspecified: Secondary | ICD-10-CM | POA: Diagnosis not present

## 2020-07-20 DIAGNOSIS — N1832 Chronic kidney disease, stage 3b: Secondary | ICD-10-CM | POA: Diagnosis not present

## 2020-07-20 DIAGNOSIS — E1122 Type 2 diabetes mellitus with diabetic chronic kidney disease: Secondary | ICD-10-CM | POA: Diagnosis not present

## 2020-07-20 DIAGNOSIS — I130 Hypertensive heart and chronic kidney disease with heart failure and stage 1 through stage 4 chronic kidney disease, or unspecified chronic kidney disease: Secondary | ICD-10-CM | POA: Diagnosis not present

## 2020-07-20 DIAGNOSIS — F0390 Unspecified dementia without behavioral disturbance: Secondary | ICD-10-CM | POA: Diagnosis not present

## 2020-07-22 DIAGNOSIS — I130 Hypertensive heart and chronic kidney disease with heart failure and stage 1 through stage 4 chronic kidney disease, or unspecified chronic kidney disease: Secondary | ICD-10-CM | POA: Diagnosis not present

## 2020-07-22 DIAGNOSIS — E1122 Type 2 diabetes mellitus with diabetic chronic kidney disease: Secondary | ICD-10-CM | POA: Diagnosis not present

## 2020-07-22 DIAGNOSIS — F0390 Unspecified dementia without behavioral disturbance: Secondary | ICD-10-CM | POA: Diagnosis not present

## 2020-07-22 DIAGNOSIS — N1832 Chronic kidney disease, stage 3b: Secondary | ICD-10-CM | POA: Diagnosis not present

## 2020-07-22 DIAGNOSIS — I5032 Chronic diastolic (congestive) heart failure: Secondary | ICD-10-CM | POA: Diagnosis not present

## 2020-07-22 DIAGNOSIS — I214 Non-ST elevation (NSTEMI) myocardial infarction: Secondary | ICD-10-CM | POA: Diagnosis not present

## 2020-07-26 DIAGNOSIS — F0390 Unspecified dementia without behavioral disturbance: Secondary | ICD-10-CM | POA: Diagnosis not present

## 2020-07-26 DIAGNOSIS — N1832 Chronic kidney disease, stage 3b: Secondary | ICD-10-CM | POA: Diagnosis not present

## 2020-07-26 DIAGNOSIS — I130 Hypertensive heart and chronic kidney disease with heart failure and stage 1 through stage 4 chronic kidney disease, or unspecified chronic kidney disease: Secondary | ICD-10-CM | POA: Diagnosis not present

## 2020-07-26 DIAGNOSIS — E1122 Type 2 diabetes mellitus with diabetic chronic kidney disease: Secondary | ICD-10-CM | POA: Diagnosis not present

## 2020-07-26 DIAGNOSIS — I214 Non-ST elevation (NSTEMI) myocardial infarction: Secondary | ICD-10-CM | POA: Diagnosis not present

## 2020-07-26 DIAGNOSIS — I5032 Chronic diastolic (congestive) heart failure: Secondary | ICD-10-CM | POA: Diagnosis not present

## 2020-07-28 ENCOUNTER — Ambulatory Visit: Payer: Medicare Other | Admitting: Endocrinology

## 2020-07-28 DEATH — deceased

## 2020-10-13 NOTE — Progress Notes (Deleted)
Cardiology Office Note:    Date:  10/13/2020   ID:  Vanessa Craig, DOB 1928/11/17, MRN [id number]  PCP:  Ernestene Kiel, MD  Cardiologist:  Shirlee More, MD    Referring MD: Ernestene Kiel, MD    ASSESSMENT:    No diagnosis found. PLAN:    In order of problems listed above:  1. ***   Next appointment: ***   Medication Adjustments/Labs and Tests Ordered: Current medicines are reviewed at length with the patient today.  Concerns regarding medicines are outlined above.  No orders of the defined types were placed in this encounter.  No orders of the defined types were placed in this encounter.   No chief complaint on file.   History of Present Illness:    Vanessa Craig is a 86 y.o. female with a hx of hypertensive heart disease with heart failure and chronic kidney disease sinus bradycardia type 2 diabetes mellitus and is followed by endocrinology for thyroid nodules and her diabetes.  She was last seen 07/14/2020.She was seen by my partner Dr. Geraldo Pitter at [facility] 06/27/2020.  He notes that before providing to the hospital she had chest pain her troponin was elevated and was diagnosed as non-ST elevation MI.  He also noted that she has severe renal insufficiency.  I independently reviewed her EKG from the emergency room which showed sinus rhythm and minor nonspecific ST abnormality.  In the ED her troponin was elevated 1.34 and her GFR was significantly elevated at 1.90.  In hospital renal function worsened creatinine 2.6 with stage IV CKD GFR 17 cc heart failure is decompensated requiring increasing dose of diuretic and decision was made not to pursue cardiac interventions   Compliance with diet, lifestyle and medications: *** Past Medical History:  Diagnosis Date   Acute congestive heart failure with left ventricular diastolic dysfunction (HCC)    Acute renal failure superimposed on stage 3b chronic kidney disease (HCC)    Acute respiratory failure  (HCC)    Chest pain    Chronic kidney disease, stage III (moderate) 08/06/2013   Dementia ([place])    Diabetes mellitus without complication ([place])    Diabetic polyneuropathy associated with type 2 diabetes mellitus ([place]) 04/25/2018   DM (diabetes mellitus) ([place]) 07/31/2013   Esophageal reflux 03/08/2018   Essential hypertension 08/24/2016   Hammer toe of left foot 04/25/2018   Hypercholesterolemia    Hypertension    Hypertensive chronic kidney disease 08/04/2013   Hypertensive CKD (chronic kidney disease) 08/24/2016   Hypertensive emergency    Obesity 03/08/2018   Onychomycosis with ingrown toenail 04/25/2018   Other and unspecified hyperlipidemia 08/06/2013   PUD (peptic ulcer disease) 03/08/2018   Sinus bradycardia 03/08/2018   Type 2 diabetes mellitus with hyperglycemia ([place])    Unspecified essential hypertension 08/04/2013    Past Surgical History:  Procedure Laterality Date   APPENDECTOMY      Current Medications: No outpatient medications have been marked as taking for the 10/14/20 encounter (Appointment) with Richardo Priest, MD.     Allergies:   Codeine   Social History   Socioeconomic History   Marital status: Unknown    Spouse name: Not on file   Number of children: Not on file   Years of education: Not on file   Highest education level: Not on file  Occupational History   Not on file  Tobacco Use   Smoking status: Never Smoker   Smokeless tobacco: Never Used  Vaping Use   Vaping Use: Never used  Substance and Sexual Activity   Alcohol use: No   Drug use: Never   Sexual activity: Not on file  Other Topics Concern   Not on file  Social History Narrative   Not on file   Social Determinants of Health   Financial Resource Strain:    Difficulty of Paying Living Expenses: Not on file  Food Insecurity:    Worried About [place] in the Last Year: Not on file   Ran Out of Food in the Last Year: Not on file  Transportation  Needs:    Lack of Transportation (Medical): Not on file   Lack of Transportation (Non-Medical): Not on file  Physical Activity:    Days of Exercise per Week: Not on file   Minutes of Exercise per Session: Not on file  Stress:    Feeling of Stress : Not on file  Social Connections:    Frequency of Communication with Friends and Family: Not on file   Frequency of Social Gatherings with Friends and Family: Not on file   Attends Religious Services: Not on file   Active Member of Clubs or Organizations: Not on file   Attends Archivist Meetings: Not on file   Marital Status: Not on file     Family History: The patient's ***family history includes Heart attack in her father; Heart disease in her father; Hypertension in her mother. ROS:   Please see the history of present illness.    All other systems reviewed and are negative.  EKGs/Labs/Other Studies Reviewed:    The following studies were reviewed today:  EKG:  EKG ordered today and personally reviewed.  The ekg ordered today demonstrates ***  Recent Labs: 03/02/2020: ALT 11; TSH 2.65 04/13/2020: BUN 35; Creatinine, Ser 1.95; NT-Pro BNP 369; Potassium 5.1; Sodium 139  Recent Lipid Panel    Component Value Date/Time   CHOL 128 03/02/2020 1120   TRIG 190.0 (H) 03/02/2020 1120   HDL 37.70 (L) 03/02/2020 1120   CHOLHDL 3 03/02/2020 1120   VLDL 38.0 03/02/2020 1120   LDLCALC 53 03/02/2020 1120   LDLDIRECT 83.0 08/23/2018 1013    Physical Exam:    VS:  There were no vitals taken for this visit.    Wt Readings from Last 3 Encounters:  07/14/20 178 lb (80.7 kg)  04/27/20 182 lb 3.2 oz (82.6 kg)  04/13/20 185 lb (83.9 kg)     GEN: *** Well nourished, well developed in no acute distress HEENT: Normal NECK: No JVD; No carotid bruits LYMPHATICS: No lymphadenopathy CARDIAC: ***RRR, no murmurs, rubs, gallops RESPIRATORY:  Clear to auscultation without rales, wheezing or rhonchi  ABDOMEN: Soft, non-tender,  non-distended MUSCULOSKELETAL:  No edema; No deformity  SKIN: Warm and dry NEUROLOGIC:  Alert and oriented x 3 PSYCHIATRIC:  Normal affect    Signed, Shirlee More, MD  10/13/2020 4:55 PM    [facility]

## 2020-10-14 ENCOUNTER — Ambulatory Visit: Payer: Medicare Other | Admitting: Cardiology
